# Patient Record
Sex: Female | Born: 1970 | Race: White | Hispanic: No | Marital: Married | Smoking: Never smoker
Health system: Southern US, Community
[De-identification: ages and names within clinical notes are randomized; demographics above are authoritative.]

## PROBLEM LIST (undated history)

## (undated) DIAGNOSIS — I10 Essential (primary) hypertension: Secondary | ICD-10-CM

## (undated) DIAGNOSIS — G4733 Obstructive sleep apnea (adult) (pediatric): Secondary | ICD-10-CM

## (undated) DIAGNOSIS — G8929 Other chronic pain: Secondary | ICD-10-CM

## (undated) DIAGNOSIS — D649 Anemia, unspecified: Secondary | ICD-10-CM

## (undated) DIAGNOSIS — E669 Obesity, unspecified: Secondary | ICD-10-CM

## (undated) DIAGNOSIS — K219 Gastro-esophageal reflux disease without esophagitis: Secondary | ICD-10-CM

## (undated) DIAGNOSIS — G4769 Other sleep related movement disorders: Secondary | ICD-10-CM

## (undated) DIAGNOSIS — J309 Allergic rhinitis, unspecified: Secondary | ICD-10-CM

## (undated) DIAGNOSIS — M545 Low back pain, unspecified: Secondary | ICD-10-CM

## (undated) DIAGNOSIS — R519 Headache, unspecified: Secondary | ICD-10-CM

## (undated) DIAGNOSIS — M1990 Unspecified osteoarthritis, unspecified site: Secondary | ICD-10-CM

## (undated) DIAGNOSIS — Z9989 Dependence on other enabling machines and devices: Secondary | ICD-10-CM

## (undated) DIAGNOSIS — R51 Headache: Secondary | ICD-10-CM

## (undated) HISTORY — DX: Allergic rhinitis, unspecified: J30.9

## (undated) HISTORY — DX: Obesity, unspecified: E66.9

## (undated) HISTORY — DX: Essential (primary) hypertension: I10

## (undated) HISTORY — DX: Dependence on other enabling machines and devices: Z99.89

## (undated) HISTORY — DX: Other chronic pain: G89.29

## (undated) HISTORY — DX: Other sleep related movement disorders: G47.69

## (undated) HISTORY — DX: Obstructive sleep apnea (adult) (pediatric): G47.33

## (undated) HISTORY — DX: Low back pain, unspecified: M54.50

## (undated) HISTORY — DX: Low back pain: M54.5

---

## 2004-04-15 ENCOUNTER — Emergency Department (HOSPITAL_COMMUNITY): Admission: EM | Admit: 2004-04-15 | Discharge: 2004-04-15 | Payer: Self-pay | Admitting: Emergency Medicine

## 2006-12-19 ENCOUNTER — Emergency Department (HOSPITAL_COMMUNITY): Admission: EM | Admit: 2006-12-19 | Discharge: 2006-12-19 | Payer: Self-pay | Admitting: Emergency Medicine

## 2007-12-06 ENCOUNTER — Emergency Department (HOSPITAL_COMMUNITY): Admission: EM | Admit: 2007-12-06 | Discharge: 2007-12-06 | Payer: Self-pay | Admitting: Emergency Medicine

## 2007-12-06 DIAGNOSIS — R1011 Right upper quadrant pain: Secondary | ICD-10-CM

## 2008-10-09 ENCOUNTER — Ambulatory Visit: Payer: Self-pay | Admitting: Internal Medicine

## 2008-10-09 LAB — CONVERTED CEMR LAB
ALT: 15 units/L (ref 0–35)
Albumin: 3.6 g/dL (ref 3.5–5.2)
Alkaline Phosphatase: 79 units/L (ref 39–117)
BUN: 10 mg/dL (ref 6–23)
Basophils Relative: 0.9 % (ref 0.0–3.0)
Bilirubin Urine: NEGATIVE
Calcium: 8.8 mg/dL (ref 8.4–10.5)
Creatinine, Ser: 0.6 mg/dL (ref 0.4–1.2)
Eosinophils Absolute: 0.2 10*3/uL (ref 0.0–0.7)
HCT: 32.8 % — ABNORMAL LOW (ref 36.0–46.0)
Hemoglobin: 11 g/dL — ABNORMAL LOW (ref 12.0–15.0)
Ketones, ur: NEGATIVE mg/dL
Lymphocytes Relative: 19.7 % (ref 12.0–46.0)
MCV: 81.1 fL (ref 78.0–100.0)
Neutrophils Relative %: 69.4 % (ref 43.0–77.0)
Platelets: 311 10*3/uL (ref 150.0–400.0)
RBC: 4.05 M/uL (ref 3.87–5.11)
Sodium: 143 meq/L (ref 135–145)
TSH: 1.98 microintl units/mL (ref 0.35–5.50)
Total CHOL/HDL Ratio: 5
Total Protein, Urine: NEGATIVE mg/dL
Total Protein: 7.7 g/dL (ref 6.0–8.3)
Urine Glucose: NEGATIVE mg/dL
Urobilinogen, UA: 0.2 (ref 0.0–1.0)
VLDL: 14 mg/dL (ref 0.0–40.0)

## 2008-10-13 DIAGNOSIS — J329 Chronic sinusitis, unspecified: Secondary | ICD-10-CM | POA: Insufficient documentation

## 2008-10-13 DIAGNOSIS — J309 Allergic rhinitis, unspecified: Secondary | ICD-10-CM | POA: Insufficient documentation

## 2008-10-13 DIAGNOSIS — K8020 Calculus of gallbladder without cholecystitis without obstruction: Secondary | ICD-10-CM

## 2008-10-14 ENCOUNTER — Ambulatory Visit: Payer: Self-pay | Admitting: Internal Medicine

## 2008-10-14 DIAGNOSIS — I10 Essential (primary) hypertension: Secondary | ICD-10-CM

## 2008-10-14 DIAGNOSIS — R259 Unspecified abnormal involuntary movements: Secondary | ICD-10-CM | POA: Insufficient documentation

## 2008-10-16 ENCOUNTER — Encounter: Payer: Self-pay | Admitting: Internal Medicine

## 2008-10-16 LAB — CONVERTED CEMR LAB
Folate: 17.4 ng/mL
Sed Rate: 57 mm/hr — ABNORMAL HIGH (ref 0–22)
Vitamin B-12: 287 pg/mL (ref 211–911)

## 2008-11-11 ENCOUNTER — Ambulatory Visit: Payer: Self-pay | Admitting: Internal Medicine

## 2009-02-23 HISTORY — PX: CHOLECYSTECTOMY: SHX55

## 2009-02-24 ENCOUNTER — Ambulatory Visit: Payer: Self-pay | Admitting: Internal Medicine

## 2009-03-01 ENCOUNTER — Encounter: Payer: Self-pay | Admitting: Internal Medicine

## 2009-03-01 ENCOUNTER — Inpatient Hospital Stay (HOSPITAL_COMMUNITY): Admission: AD | Admit: 2009-03-01 | Discharge: 2009-03-03 | Payer: Self-pay | Admitting: Surgery

## 2009-03-02 ENCOUNTER — Encounter (INDEPENDENT_AMBULATORY_CARE_PROVIDER_SITE_OTHER): Payer: Self-pay | Admitting: Surgery

## 2009-03-18 ENCOUNTER — Encounter: Payer: Self-pay | Admitting: Internal Medicine

## 2009-06-10 ENCOUNTER — Ambulatory Visit: Payer: Self-pay | Admitting: Internal Medicine

## 2009-08-19 ENCOUNTER — Ambulatory Visit: Payer: Self-pay | Admitting: Internal Medicine

## 2009-08-19 ENCOUNTER — Encounter: Payer: Self-pay | Admitting: Internal Medicine

## 2009-08-19 DIAGNOSIS — D649 Anemia, unspecified: Secondary | ICD-10-CM

## 2009-08-19 LAB — CONVERTED CEMR LAB
Basophils Relative: 0.4 % (ref 0.0–3.0)
Compl, Total (CH50): 60 (ref 31–60)
Complement C4, Body Fluid: 27 mg/dL (ref 16–47)
Eosinophils Relative: 1.7 % (ref 0.0–5.0)
HCT: 35.6 % — ABNORMAL LOW (ref 36.0–46.0)
Hemoglobin: 12 g/dL (ref 12.0–15.0)
IgA: 183 mg/dL (ref 68–378)
Lymphocytes Relative: 19.7 % (ref 12.0–46.0)
MCV: 82 fL (ref 78.0–100.0)
Neutro Abs: 7.8 10*3/uL — ABNORMAL HIGH (ref 1.4–7.7)
Neutrophils Relative %: 72.2 % (ref 43.0–77.0)
Platelets: 394 10*3/uL (ref 150.0–400.0)
Rhuematoid fact SerPl-aCnc: <20 intl units/mL (ref 0–20)
Saturation Ratios: 14 % — ABNORMAL LOW (ref 20.0–50.0)
TSH: 1.52 microintl units/mL (ref 0.35–5.50)
Transferrin: 234.6 mg/dL (ref 212.0–360.0)

## 2009-11-08 ENCOUNTER — Telehealth: Payer: Self-pay | Admitting: Internal Medicine

## 2009-11-08 ENCOUNTER — Ambulatory Visit: Payer: Self-pay | Admitting: Internal Medicine

## 2009-11-08 DIAGNOSIS — J019 Acute sinusitis, unspecified: Secondary | ICD-10-CM

## 2009-11-24 ENCOUNTER — Ambulatory Visit: Payer: Self-pay | Admitting: Internal Medicine

## 2009-11-24 DIAGNOSIS — M26629 Arthralgia of temporomandibular joint, unspecified side: Secondary | ICD-10-CM

## 2010-02-21 ENCOUNTER — Encounter: Payer: Self-pay | Admitting: Internal Medicine

## 2010-02-21 ENCOUNTER — Other Ambulatory Visit: Payer: Self-pay | Admitting: Internal Medicine

## 2010-02-21 ENCOUNTER — Ambulatory Visit
Admission: RE | Admit: 2010-02-21 | Discharge: 2010-02-21 | Payer: Self-pay | Source: Home / Self Care | Attending: Internal Medicine | Admitting: Internal Medicine

## 2010-02-21 LAB — BASIC METABOLIC PANEL
Chloride: 98 mEq/L (ref 96–112)
Creatinine, Ser: 0.6 mg/dL (ref 0.4–1.2)
Potassium: 4.1 mEq/L (ref 3.5–5.1)

## 2010-02-22 NOTE — Assessment & Plan Note (Signed)
Summary: f/u meds/GALL BLADDER PROBLEMS/RS'D /NWS#/cd   Vital Signs:  Patient profile:   40 year old female Height:      65 inches (165.10 cm) Weight:      290.8 pounds (132.18 kg) O2 Sat:      97 % on Room air Temp:     98.5 degrees F (36.94 degrees C) oral Pulse rate:   90 / minute BP sitting:   122 / 72  (left arm) Cuff size:   large  Vitals Entered By: Orlan Leavens (February 24, 2009 1:28 PM)  O2 Flow:  Room air CC: follow-up on meds Is Patient Diabetic? No Pain Assessment Patient in pain? no        Primary Care Provider:  Newt Lukes MD  CC:  follow-up on meds.  History of Present Illness: here for followup on cholelithiasis hx - c/o increase GB attacks  in past few mos - describes as sharp RUQ pain, worse with any food other than "water" symptoms worse with meals, better with fasting sudden onset sharp pain, gradually eases off over hours-days currently mild constant pain  +hx same - seen by surg 2 mos ago and rec surg - but pt wanted to wait until after insurance changes 2/19 ?if any other tx besides surg for problem and ?if ok to wait until 2/19 for surg--- no fever, no severe pain - no n/v or dehydration    prior hx also reviewed today: fatigue and muscle twitch - seems somewhat improved with change from heat to cooler weather labs reviewed and inc ESR explained again - has not yet seen rheum "i need to work that into my sched" no joint swelling or fever  also has not yet sched time for PAP with gyn  still with numbness in hands mostly present with prolonged activity with hands at work symmetrically present - extneds into arms never present upon waking - ?if CTS symptoms explain this  Clinical Review Panels:  Lipid Management   Cholesterol:  161 (10/09/2008)   LDL (bad choesterol):  113 (10/09/2008)   HDL (good cholesterol):  33.90 (10/09/2008)  CBC   WBC:  9.2 (10/09/2008)   RBC:  4.05 (10/09/2008)   Hgb:  11.0 (10/09/2008)   Hct:   32.8 (10/09/2008)   Platelets:  311.0 (10/09/2008)   MCV  81.1 (10/09/2008)   MCHC  33.7 (10/09/2008)   RDW  13.5 (10/09/2008)   PMN:  69.4 (10/09/2008)   Lymphs:  19.7 (10/09/2008)   Monos:  7.3 (10/09/2008)   Eosinophils:  2.7 (10/09/2008)   Basophil:  0.9 (10/09/2008)  Complete Metabolic Panel   Glucose:  112 (10/09/2008)   Sodium:  143 (10/09/2008)   Potassium:  4.3 (10/09/2008)   Chloride:  107 (10/09/2008)   CO2:  30 (10/09/2008)   BUN:  10 (10/09/2008)   Creatinine:  0.6 (10/09/2008)   Albumin:  3.6 (10/09/2008)   Total Protein:  7.7 (10/09/2008)   Calcium:  8.8 (10/09/2008)   Total Bili:  0.5 (10/09/2008)   Alk Phos:  79 (10/09/2008)   SGPT (ALT):  15 (10/09/2008)   SGOT (AST):  16 (10/09/2008)   Current Medications (verified): 1)  Zyrtec Allergy 10 Mg Caps (Cetirizine Hcl) .... Take 1 By Mouth Qd 2)  Naprosyn 250 Mg Tabs (Naproxen) .... Take Prn 3)  Tylenol Extra Strength 500 Mg Tabs (Acetaminophen) .... Take Prn 4)  Multivitamins  Tabs (Multiple Vitamin) .... Take 1 By Mouth Qd 5)  Milk Thistle 200 Mg Caps (Milk  Thistle) .... Take 1 By Mouth Qd  Allergies (verified): No Known Drug Allergies  Past History:  Past Medical History: Last updated: 10/14/2008 Allergic rhinitis Cholelithiasis Hypertension morbid obesity  Review of Systems       The patient complains of abdominal pain.  The patient denies fever, chest pain, syncope, peripheral edema, and severe indigestion/heartburn.    Physical Exam  General:  overweight-appearing.  alert, well-developed, well-nourished, and cooperative to examination.   spouse at side Lungs:  normal respiratory effort, no intercostal retractions or use of accessory muscles; normal breath sounds bilaterally - no crackles and no wheezes.    Heart:  normal rate, regular rhythm, no murmur, and no rub. BLE with 1+ edema.  Abdomen:  obese, soft, non-tender, normal bowel sounds, no distention; no masses and no appreciable  hepatomegaly or splenomegaly.   Skin:  pale but no rashes, vesicles, ulcers, or erythema. No nodules or irregularity to palpation.  no shingles along RUQ   Impression & Recommendations:  Problem # 1:  CHOLELITHIASIS (ICD-574.20)  recurrent pain symptoms - +hx same  [place] done11/13/09 with cholelithiasis and fatty liver benign exam todayencouraged to sched shole with gen surg as prev rec advised continued low fat diet modification as well as weight loss advised on warning signs such as severe pain, fever or inability to tol by mouth that would promt intervention before 2/19 (approx 2 weeks away) - pt agrees and will contact surg office  Orders: Surgical Referral (Surgery)  Complete Medication List: 1)  Zyrtec Allergy 10 Mg Caps (Cetirizine hcl) .... Take 1 by mouth qd 2)  Naprosyn 250 Mg Tabs (Naproxen) .... Take prn 3)  Tylenol Extra Strength 500 Mg Tabs (Acetaminophen) .... Take prn 4)  Multivitamins Tabs (Multiple vitamin) .... Take 1 by mouth qd 5)  Milk Thistle 200 Mg Caps (Milk thistle) .... Take 1 by mouth qd  Patient Instructions: 1)  it was good to see you today.  2)  ok to proceed with surg plans as discussed -  3)  Please schedule a follow-up appointment as scheduled, sooner if problems.

## 2010-02-22 NOTE — Letter (Signed)
Summary: [facility]   Imported By: Lester Elmore 06/14/2009 11:20:44  _____________________________________________________________________  External Attachment:    Type:   Image     Comment:   External Document

## 2010-02-22 NOTE — Letter (Signed)
Summary: Out of Work  [facility]  [address] [place], [place] [address]   Phone: [phone]  Fax: [phone]    November 08, 2009   Employee:  JAMILETT FERRANTE    To Whom It May Concern:   For Medical reasons, please excuse the above named employee from work for the following dates:  Start:   Nov 08, 2009  End:   Nov 09, 2009  -    to return to work without restriction Nov 10, 2009  If you need additional information, please feel free to contact our office.         Sincerely,    Corwin Levins MD

## 2010-02-22 NOTE — Assessment & Plan Note (Signed)
Summary: FLU? /NWS   Vital Signs:  Patient profile:   40 year old female Height:      64 inches Weight:      296.13 pounds BMI:     51.01 O2 Sat:      97 % on Room air Temp:     99.1 degrees F oral Pulse rate:   113 / minute BP sitting:   128 / 80  (left arm) Cuff size:   large  Vitals Entered By: Zella Ball Ewing CMA (AAMA) (November 08, 2009 4:00 PM)  O2 Flow:  Room air CC: Sore throat, fever, coughing, congestion/RE   Primary Care Brexley Cutshaw:  Newt Lukes MD  CC:  Sore throat, fever, coughing, and congestion/RE.  History of Present Illness: here with acute onset mild to mod 2-3 days facial pain, pressure, fever, greenish d/c, mild ST and Pt denies CP, worsening sob, doe, wheezing, orthopnea, pnd, worsening LE edema, palps, dizziness or syncope  This is on top fo 3 wks worsening nasal allergy symptoms with congestion , post nasal gtt.  and today also with body aches and mild nausea  but no vomiting, abd pain, bowel change or blood.  Pt denies new neuro symptoms such as headache, facial or extremity weakness  No  wt loss, night sweats, loss of appetite or other constitutional symptoms  Wants to know how BP doing today  Problems Prior to Update: 1)  Sinusitis- Acute-nos  (ICD-461.9) 2)  Anemia, Mild  (ICD-285.9) 3)  Pain in Joint, Multiple Sites  (ICD-719.49) 4)  Numbness  (ICD-782.0) 5)  Muscle Fasciculations  (ICD-781.0) 6)  Morbid Obesity  (ICD-278.01) 7)  Fatigue  (ICD-780.79) 8)  Hypertension  (ICD-401.9) 9)  Cholelithiasis  (ICD-574.20) 10)  Sinusitis  (ICD-473.9) 11)  Allergic Rhinitis  (ICD-477.9) 12)  Nausea  (ICD-787.02) 13)  Ruq Pain  (ICD-789.01) 14)  Preventive Health Care  (ICD-V70.0)  Medications Prior to Update: 1)  Naprosyn 250 Mg Tabs (Naproxen) .... Take Prn 2)  Tylenol Extra Strength 500 Mg Tabs (Acetaminophen) .... Take Prn 3)  Multivitamins  Tabs (Multiple Vitamin) .... Take 1 By Mouth Qd 4)  Allegra 60 Mg Tabs (Fexofenadine Hcl) .Marland Kitchen.. 1 Once  Daily  Current Medications (verified): 1)  Naprosyn 250 Mg Tabs (Naproxen) .... Take Prn 2)  Tylenol Extra Strength 500 Mg Tabs (Acetaminophen) .... Take Prn 3)  Multivitamins  Tabs (Multiple Vitamin) .... Take 1 By Mouth Qd 4)  Levocetirizine Dihydrochloride 5 Mg Tabs (Levocetirizine Dihydrochloride) .Marland Kitchen.. 1po Once Daily As Needed Allergies 5)  Azithromycin 250 Mg Tabs (Azithromycin) .... 2po Qd For 1 Day, Then 1po Qd For 4days, Then Stop  Allergies (verified): No Known Drug Allergies  Past History:  Past Medical History: Last updated: 08/19/2009 Allergic rhinitis Cholelithiasis Hypertension-not on treatment morbid obesity  Past Surgical History: Last updated: 08/19/2009 Lab Chole Feb '11 ( Dr. Daphine Deutscher)  GoP0  Social History: Last updated: 08/19/2009 [employer]. 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  Risk Factors: Alcohol Use: <1 (10/14/2008) Caffeine Use: 2 (10/14/2008) Exercise: no (10/14/2008)  Risk Factors: Smoking Status: never (10/14/2008)  Review of Systems       all otherwise negative per pt -    Physical Exam  General:  alert and overweight-appearing. , mild ill   Head:  normocephalic and atraumatic.   Eyes:  vision grossly intact, pupils equal, and pupils round.   Ears:  left tm  severe red, right TM mild erythema, sinus tender bilat Nose:  no external deformity, nasal dischargemucosal pallor, and mucosal edema.   Mouth:  pharyngeal erythema and fair dentition.   Neck:  supple and cervical lymphadenopathy.   Lungs:  normal respiratory effort and normal breath sounds.   Heart:  normal rate and regular rhythm.   Extremities:  no edema, no erythema    Impression & Recommendations:  Problem # 1:  SINUSITIS- ACUTE-NOS (ICD-461.9)  Her updated medication list for this problem includes:    Azithromycin 250 Mg Tabs (Azithromycin)  .Marland Kitchen... 2po qd for 1 day, then 1po qd for 4days, then stop treat as above, f/u any worsening signs or symptoms   Problem # 2:  ALLERGIC RHINITIS (ICD-477.9)  Her updated medication list for this problem includes:    Levocetirizine Dihydrochloride 5 Mg Tabs (Levocetirizine dihydrochloride) .Marland Kitchen... 1po once daily as needed allergies treat as above, f/u any worsening signs or symptoms   Problem # 3:  HYPERTENSION (ICD-401.9)  BP today: 128/80 Prior BP: 142/90 (08/19/2009)  Labs Reviewed: K+: 4.3 (10/09/2008) Creat: : 0.6 (10/09/2008)   Chol: 161 (10/09/2008)   HDL: 33.90 (10/09/2008)   LDL: 113 (10/09/2008)   TG: 70.0 (10/09/2008) stable overall by hx and exam, ok to continue meds/tx as is - no meds needed at this time  Complete Medication List: 1)  Naprosyn 250 Mg Tabs (Naproxen) .... Take prn 2)  Tylenol Extra Strength 500 Mg Tabs (Acetaminophen) .... Take prn 3)  Multivitamins Tabs (Multiple vitamin) .... Take 1 by mouth qd 4)  Levocetirizine Dihydrochloride 5 Mg Tabs (Levocetirizine dihydrochloride) .Marland Kitchen.. 1po once daily as needed allergies 5)  Azithromycin 250 Mg Tabs (Azithromycin) .... 2po qd for 1 day, then 1po qd for 4days, then stop  Patient Instructions: 1)  Please take all new medications as prescribed 2)  Continue all previous medications as before this visit  3)  You are given the work note 4)  Please schedule an appointment with your primary doctor as needed Prescriptions: AZITHROMYCIN 250 MG TABS (AZITHROMYCIN) 2po qd for 1 day, then 1po qd for 4days, then stop  #6 x 1   Entered and Authorized by:   Corwin Levins MD   Signed by:   Corwin Levins MD on 11/08/2009   Method used:   Print then Give to Patient   RxID:   [id number] LEVOCETIRIZINE DIHYDROCHLORIDE 5 MG TABS (LEVOCETIRIZINE DIHYDROCHLORIDE) 1po once daily as needed allergies  #30 x 11   Entered and Authorized by:   Corwin Levins MD   Signed by:   Corwin Levins MD on 11/08/2009   Method used:   Print then Give  to Patient   RxID:   [id number]    Orders Added: 1)  Est. Patient Level IV [44010]

## 2010-02-22 NOTE — Letter (Signed)
Summary: [facility]   Imported By: Sherian Rein 03/17/2009 07:38:19  _____________________________________________________________________  External Attachment:    Type:   Image     Comment:   External Document

## 2010-02-22 NOTE — Assessment & Plan Note (Signed)
Summary: TRANSFER FROM DR LESCHBER/NWS  OK PER FLAG TO LESLIE   Vital Signs:  Patient profile:   40 year old female Height:      65 inches Weight:      294 pounds BMI:     49.10 O2 Sat:      98 % on Room air Temp:     97.8 degrees F oral Pulse rate:   100 / minute BP sitting:   142 / 90  (left arm) Cuff size:   large  Vitals Entered By: Ami Bullins CMA (August 19, 2009 1:20 PM)  O2 Flow:  Room air CC: New pt here to est care with primary md/ ab   Primary Care Provider:  Newt Lukes MD  CC:  New pt here to est care with primary md/ ab.  History of Present Illness: 18+month h/o fatigue, pain in the joints, especially right hip. She has shifted weight when standing that has lead to left hip pain. She has twitching of her hands, she has increased weakness over time. She has also had pain in the lower back. Concerned about RA or other rheumatolic disorder. She does take NSAIDs, APAP.   She will have transient episodes of blurred vision, more with left eye, seconds to minutes in duration. She has seen an "eye" doctor with a normal exam. This will usually occur when working or under stress.  She has increasing heat sensitivity. She does have a sister with fibromyalgia. She has a family history of fibromyalgia. Her mother had lymphoma, she also had a long history of pneumonia. Mother  did have testing that was indicative of immuno-deficiency disease.  She has occasional left-chest pain, substernal in location. The pain is a discomfort that is worrisome more than painful. Duration 20-30 minutes. Occurs after physical exertion.  No diaphoresis, no SOB, no radiation of the pain. She does have occasional exeretionally related pain. There is no family history of heart disease in woman. No DM or hyperlipidemia present. Borderline HTN but not on treatment.   She does c/o seasonal allergies - usually spring. She has failed claritin, zyrtec. Currently she gets relief with allegra.  Current  Medications (verified): 1)  Naprosyn 250 Mg Tabs (Naproxen) .... Take Prn 2)  Tylenol Extra Strength 500 Mg Tabs (Acetaminophen) .... Take Prn 3)  Multivitamins  Tabs (Multiple Vitamin) .... Take 1 By Mouth Qd 4)  Allegra 60 Mg Tabs (Fexofenadine Hcl) .Marland Kitchen.. 1 Once Daily  Allergies (verified): No Known Drug Allergies  Past History:  Past Medical History: Allergic rhinitis Cholelithiasis Hypertension-not on treatment morbid obesity  Past Surgical History: Lab Chole Feb '11 ( Dr. Daphine Deutscher)  GoP0  Family History: Father -1939: OA, HTN,  Mother died @ 22 due to lymphoma & MAI infection in lungs.  One sister has melanoma, other has fibromyalgia. 2 aunts also with autoimmune disorders. 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  Physical Exam  General:  overweight white female in no distress Head:  Normocephalic and atraumatic without obvious abnormalities. No apparent alopecia or balding. Eyes:  vision grossly intact, pupils equal, pupils round, corneas and lenses clear, and no injection.   Ears:  External ear exam shows no significant lesions or deformities.  Otoscopic examination  reveals clear canals, tympanic membranes are intact bilaterally without bulging, retraction, inflammation or discharge. Hearing is grossly normal bilaterally. Nose:  no external deformity and no external erythema.   Mouth:  Oral mucosa and oropharynx without lesions or exudates.  Teeth in good repair. Neck:  supple, full ROM, no thyromegaly, no JVD, and no carotid bruits.   Chest Wall:  No deformities, masses, or tenderness noted. Breasts:  deferred Lungs:   Normal respiratory effort, chest expands symmetrically. Lungs are clear to auscultation, no crackles or wheezes. Heart:  normal rate, regular rhythm, no murmur, and no gallop.   Abdomen:  soft, non-tender, and normal bowel sounds.   Genitalia:  deferred Msk:  normal ROM, no joint tenderness, no joint swelling, and no redness over joints.   Pulses:  2+ radial pulse Extremities:  No clubbing, cyanosis, edema, or deformity noted with normal full range of motion of all joints.   Neurologic:  alert & oriented X3, cranial nerves II-XII intact, strength normal in all extremities, gait normal, and DTRs symmetrical and normal.   Skin:  turgor normal, color normal, no rashes, and no suspicious lesions.   Cervical Nodes:  no anterior cervical adenopathy and no posterior cervical adenopathy.   Psych:  Oriented X3, memory intact for recent and remote, normally interactive, and good eye contact.     Impression & Recommendations:  Problem # 1:  ANEMIA, MILD (ICD-285.9) History of anemia.  Plan - follow-up lab: CBC, B12, iron studies.   Orders: TLB-B12 + Folate Pnl ([id number]-O75/IEP) TLB-CBC Platelet - w/Differential (85025-CBCD) TLB-IBC Pnl (Iron/FE;Transferrin) (83550-IBC)  Addendum - Hemoglobin at 12g is normal; B12 normal, Iron is borderline with a low percent saturation  Plan  dietary iron supplementation  Problem # 2:  MORBID OBESITY (ICD-278.01) Discussed obesity as THE major health threat facing her.   Plan - weight management program based on 1) smart food choices with no forbidden foods, 2) portion size control and frequent feedings, 3) a modicum of exercise at least 3 times a week that provides aerobic challenge and calorie burn.           Target weight 180 lbs; Goal - to loose 1-2 lbs/month  Problem # 3:  PAIN IN JOINT, MULTIPLE SITES (ICD-719.49) Patient with multiple joint pain, most especially hips.  Plan - radiographs to establish baseline and to rule out any bone destructive  process           Regular stretch/flex type exercise   Orders: T-Bilateral Hip w/Pelvis, min 2 views (73520TC)  Addendum - DG HIP BILATERAL W/PELVIS - [id number]   Clinical Data: Chronic right hip pain.   BILATERAL HIP WITH PELVIS - 4+ VIEW   Comparison: None   Findings: No acute bony abnormality.  Specifically, no fracture, subluxation, or dislocation.  Soft tissues are intact. Joint spaces are maintained.  SI joints are unremarkable.   IMPRESSION: No bony abnormality.   Read By:  Charlett Nose,  M.D  Problem # 4:  FATIGUE (ICD-780.79) Patient with strong faily history for fibromyalgia and a concern for immunodeficiency problems. She has not had frequent bacterial infections or other signs/smptoms of immune failure.  Plan - lab evaluation to include immunoglobulins, signs of inflammation or of inflammatory tissue disease.  Orders: TLB-B12 + Folate Pnl ([id number]/NAT) TLB-CBC Platelet - w/Differential (85025-CBCD) TLB-TSH (Thyroid Stimulating Hormone) (84443-TSH) TLB-IBC Pnl (Iron/FE;Transferrin) (83550-IBC)  Addendum - all labs are within normal range.  Problem # 5:  HYPERTENSION (ICD-401.9)  BP today: 142/90 Prior BP: 122/72 (02/24/2009)  Mild elevation of BP today but previously well controlled.  Plan - no indication for any medical intervention.  Orders: EKG w/ Interpretation (93000)  Problem # 6:  Preventive Health Care (ICD-V70.0) Patient with a normal exam except for weight. Her labs are in normal limits. X-rays are unremarkable. 12 Lead EKG is normal.  In summary - a nice woman who appears to be medically stable. Labs are unremarkable and do not suggest any immunodeficiency. She will return in a reasonable interval for further evaluation and exam.  Complete Medication List: 1)  Naprosyn 250 Mg Tabs (Naproxen) .... Take prn 2)  Tylenol Extra Strength 500 Mg Tabs (Acetaminophen) .... Take prn 3)  Multivitamins Tabs (Multiple vitamin) .... Take 1 by  mouth qd 4)  Allegra 60 Mg Tabs (Fexofenadine hcl) .Marland Kitchen.. 1 once daily  Other Orders: T- * Misc. Laboratory test [phone])  Patient: ALISEA MATTE Note: All result statuses are Final unless otherwise noted.  Tests: (1) B12 + Folate Panel (B12/FOL)   Vitamin B12               305 pg/mL                   211-911   Folate                    12.4 ng/mL     Deficient  0.4 - 3.4 ng/mL     Indeterminate  3.4 - 5.4 ng/mL     Normal  >5.4 ng/mL  Tests: (2) CBC Platelet w/Diff (CBCD)   White Cell Count     [H]  10.8 K/uL                   4.5-10.5   Red Cell Count            4.35 Mil/uL                 3.87-5.11   Hemoglobin                12.0 g/dL                   54.6-27.0   Hematocrit           [L]  35.6 %                      36.0-46.0   MCV                       82.0 fl                     78.0-100.0   MCHC                      33.8 g/dL                   35.0-09.3   RDW                  [H]  14.7 %                      11.5-14.6   Platelet Count            394.0 K/uL                  150.0-400.0   Neutrophil %  72.2 %                      43.0-77.0   Lymphocyte %              19.7 %                      12.0-46.0   Monocyte %                6.0 %                       3.0-12.0   Eosinophils%              1.7 %                       0.0-5.0   Basophils %               0.4 %                       0.0-3.0   Neutrophill Absolute [H]  7.8 K/uL                    1.4-7.7   Lymphocyte Absolute       2.1 K/uL                    0.7-4.0   Monocyte Absolute         0.7 K/uL                    0.1-1.0  Eosinophils, Absolute                             0.2 K/uL                    0.0-0.7   Basophils Absolute        0.0 K/uL                    0.0-0.1  Tests: (3) TSH (TSH)   FastTSH                   1.52 uIU/mL                 0.35-5.50  Tests: (4) IBC Panel (IBC)   Iron                      46 ug/dL                    22-025   Transferrin               234.6 mg/dL                  427.0-623.7   Iron Saturation      [L]  14.0 %                      20.0-50.0  Tests: (1) Immunoglobulins, Quantitative (62831)   IgG                       1370 mg/dL                  517-6160   IgA  183 mg/dL                   27-253   IgM                       167 mg/dL                   66-440  Tests: (2) Complement C3 (23830)   Complement C3             199 mg/dL                   34-742  Tests: (3) Complement C4 (59563)   Complement C4             27 mg/dL                    87-56  Tests: (4) Rheumatoid (RA) Factor (43329)  Rheumatoid (RA) Factor                             < 20 IU/mL                  0-20  Tests: (5) Complement, Total, CH50 (82190)  Complement, Total, CH50                             60 U/mL                     31-60

## 2010-02-22 NOTE — Progress Notes (Signed)
Summary: [place]  Phone Note Call from Patient   Caller: Spouse Summary of Call: Richard left vm stating pt has flu and is requesting Tamiflu for both pt and himself(Richard).  Please advise Initial call taken by: Lanier Prude, [facility]),  November 08, 2009 12:36 PM  Follow-up for Phone Call        Willing to help but will need additional information: fever? N/V? Muscle or joint pain? Follow-up by: Jacques Navy MD,  November 08, 2009 12:54 PM  Additional Follow-up for Phone Call Additional follow up Details #1::        left mess for pt to call back  Additional Follow-up by: Lanier Prude, [facility]),  November 08, 2009 4:45 PM    Additional Follow-up for Phone Call Additional follow up Details #2::    Pt had OV with JWJ 11/08/2009 Follow-up by: Margaret Pyle, CMA,  November 09, 2009 10:49 AM

## 2010-02-22 NOTE — Assessment & Plan Note (Signed)
Summary: office visit for evaluation of continuing sore throat,with fe...   Vital Signs:  Patient profile:   40 year old female Height:      64 inches Weight:      288 pounds BMI:     49.61 O2 Sat:      98 % on Room air Temp:     98.3 degrees F oral Pulse rate:   89 / minute BP sitting:   116 / 72  (left arm) Cuff size:   large  Vitals Entered By: Ami Bullins CMA (November 24, 2009 9:13 AM)  O2 Flow:  Room air CC: pt had previous symptoms of sore throat, coughing and nasal drainage she was seen by Dr Jonny Ruiz and given antibiotic whiched helped with her symptoms but she still has cough and c/o left ear soreness/ ab   Primary Care Dorrene Bently:  Newt Lukes MD  CC:  pt had previous symptoms of sore throat and coughing and nasal drainage she was seen by Dr Jonny Ruiz and given antibiotic whiched helped with her symptoms but she still has cough and c/o left ear soreness/ ab.  History of Present Illness: Emily Gay presents today with a two and a half week history of coughing and head congestion.  She was seen by Dr. Jonny Ruiz on October 17 and presented with cough, sore throat, green nasal discharge, and fevers.  At that time she was given Azithromycin for 5 days.  She finished her course of antibiotics and began to feel better and was almost at 85% of normal.  However, about 2 days later she started feeling tired again and her cough was getting worse.  She was still feeling sinus congestion around the area of her frontal sinus and often had sinus headaches.  She also developed an 8/10 pain and pressure in her left ear that she sometimes feels radiates down her left jaw and up to her temporal region.  She denied any fevers, chills, or sore throat.  She had white sputum that she produced with coughing.  She is very tired, mostly because her cough is keeping her awake at night.  The cough she feels has a tickling sensation to it that seems to irritate her throat and cause her to cough some of the time.   She takes pseudophedrine, Mucinex, and ibuprofen at night time and it helps her symptoms but it makes her too sleepy to take duriong the day.  She has tried Robatussin but it has not helped.   Of note the patient also reports that she has been told by her dentist that she grinds her teeth at night.  Current Medications (verified): 1)  Naprosyn 250 Mg Tabs (Naproxen) .... Take Prn 2)  Tylenol Extra Strength 500 Mg Tabs (Acetaminophen) .... Take Prn 3)  Multivitamins  Tabs (Multiple Vitamin) .... Take 1 By Mouth Qd 4)  Levocetirizine Dihydrochloride 5 Mg Tabs (Levocetirizine Dihydrochloride) .Marland Kitchen.. 1po Once Daily As Needed Allergies  Allergies (verified): No Known Drug Allergies  Past History:  Past Medical History: Last updated: 08/19/2009 Allergic rhinitis Cholelithiasis Hypertension-not on treatment morbid obesity  Past Surgical History: Last updated: 08/19/2009 Lab Chole Feb '11 ( Dr. Daphine Deutscher)  GoP0  Family History: Last updated: 08/19/2009 Father -1939: OA, HTN,  Mother died @ 34 due to lymphoma & MAI infection in lungs.  One sister has melanoma, other has fibromyalgia. 2 aunts also with autoimmune disorders. Family History of Arthritis (parent & grandparent) Family History Hypertension (other blood relative) Heart disease (grandparent)  Stroke (parent & grandparent) emotional illness (other blood relative)  Social History: Last updated: 08/19/2009 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  Risk Factors: Alcohol Use: <1 (10/14/2008) Caffeine Use: 2 (10/14/2008) Exercise: no (10/14/2008)  Risk Factors: Smoking Status: never (10/14/2008)  Review of Systems       The patient complains of prolonged cough and headaches.  The patient denies anorexia, fever, weight loss, weight gain, vision loss, decreased hearing, hoarseness, chest pain,  syncope, dyspnea on exertion, peripheral edema, hemoptysis, abdominal pain, melena, hematochezia, hematuria, incontinence, muscle weakness, difficulty walking, unusual weight change, abnormal bleeding, and enlarged lymph nodes.    Physical Exam  General:  alert, well-developed, and well-nourished.   Head:  normocephalic and atraumatic.   Eyes:  pupils equal, pupils round, pupils reactive to light, and corneas and lenses clear.  No tenderness to percussion of the maxillary sinus and normal transillumination. Slight tenderness to percussion and fairly normal transillumination of the frontal sinus bilaterally. Ears:  Both move with insuflation. R ear normal, L ear normal, and no external deformities.   Nose:  no external deformity, no external erythema, no nasal discharge, no mucosal edema, no airflow obstruction, and mucosal erythema.   Mouth:  good dentition, no exudates, and pharyngeal erythema.   Click felt at left TMJ when opening/closing the jaw.  Tenderness to palpation of the left TM Joint. Neck:  supple, no masses, no cervical lymphadenopathy, and no neck tenderness.   Lungs:  normal respiratory effort, normal breath sounds, no dullness, no crackles, and no wheezes.   Heart:  normal rate, regular rhythm, no murmur, no gallop, and no rub.   Pulses:  R radial normal, R posterior tibial normal, L radial normal, and L posterior tibial normal.   Extremities:  trace left pedal edema and trace right pedal edema.   Neurologic:  alert & oriented X3 and cranial nerves II-XII intact.   Skin:  turgor normal, color normal, and no rashes.   Cervical Nodes:  no anterior cervical adenopathy and no posterior cervical adenopathy.   Psych:  Oriented X3, memory intact for recent and remote, normally interactive, and good eye contact.     Impression & Recommendations:  Problem # 1:  COUGH (ICD-786.2) Since the bacterial infection seems to have resolved itself (afebrile, no green sputum) the cough seems to  be more of a cyclical cough secondary to trachial irritation of the previous illness.  Decreasing the trachial inflammation should decrease her cough.  Plan:  Codeine cough syrup with phenergan at night time for cough.           Robatussin DM during the day for cough           Tessalon Pearls three times a day           Prednison x 10 days  Problem # 2:  TMJ PAIN (ICD-524.62) She reports a sharp severe 8/10 pain that she feels in her ear along wiht the left sided facial pain.  In addition to that she felt a click on physical exam when opening and closing her jaw and pain when palpating the TM Joint.  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  Plan:  Bite block from dentist           No gum  Small bites of food           Aspercream on skin over joint           Ibuprofen to decrease inflammation   Complete Medication List: 1)  Naprosyn 250 Mg Tabs (Naproxen) .... Take prn 2)  Tylenol Extra Strength 500 Mg Tabs (Acetaminophen) .... Take prn 3)  Multivitamins Tabs (Multiple vitamin) .... Take 1 by mouth qd 4)  Levocetirizine Dihydrochloride 5 Mg Tabs (Levocetirizine dihydrochloride) .Marland Kitchen.. 1po once daily as needed allergies 5)  Prednisone 10 Mg Tabs (Prednisone) .... 2 tabs once daily x 3, 1 tab once daily x 6 6)  Benzonatate 100 Mg Caps (Benzonatate) .Marland Kitchen.. 1 by mouth three times a day for cough 7)  Promethazine Vc/codeine 6.25-5-10 Mg/108ml Syrp (Phenyleph-promethazine-cod) .Marland Kitchen.. 1 tsp q 6 as needed cough-beware of sedation Prescriptions: PROMETHAZINE VC/CODEINE 6.25-5-10 MG/5ML SYRP (PHENYLEPH-PROMETHAZINE-COD) 1 tsp q 6 as needed cough-beware of sedation  #80z x 1   Entered and Authorized by:   Jacques Navy MD   Signed by:   Jacques Navy MD on 11/24/2009   Method used:   Telephoned to ...       [facility] [address] [phone]* (retail)       [address] [place], [place]  [address]       Ph: [phone]       Fax:  [phone]   RxID:   [id number] BENZONATATE 100 MG CAPS (BENZONATATE) 1 by mouth three times a day for cough  #30 x 1   Entered and Authorized by:   Jacques Navy MD   Signed by:   Jacques Navy MD on 11/24/2009   Method used:   Electronically to        [facility] [address] [phone]* (retail)       [address] [place], [place]  [address]       Ph: [phone]       Fax: [phone]   RxID:   [id number] PREDNISONE 10 MG TABS (PREDNISONE) 2 tabs once daily x 3, 1 tab once daily x 6  #12 x 0   Entered and Authorized by:   Jacques Navy MD   Signed by:   Jacques Navy MD on 11/24/2009   Method used:   Electronically to        [facility] [address] [phone]* (retail)       [address] [place], [place]  [address]       Ph: [phone]       Fax: [phone]   RxID:   [id number]    Orders Added: 1)  Est. Patient Level III [70350]

## 2010-02-23 ENCOUNTER — Other Ambulatory Visit: Payer: Self-pay | Admitting: Internal Medicine

## 2010-02-23 DIAGNOSIS — R2230 Localized swelling, mass and lump, unspecified upper limb: Secondary | ICD-10-CM

## 2010-03-01 ENCOUNTER — Encounter (HOSPITAL_COMMUNITY): Payer: Self-pay

## 2010-03-01 ENCOUNTER — Ambulatory Visit (HOSPITAL_COMMUNITY)
Admission: RE | Admit: 2010-03-01 | Discharge: 2010-03-01 | Disposition: A | Discharge status: Home / Self Care | Payer: BC Managed Care – PPO | Source: Ambulatory Visit | Attending: Internal Medicine | Admitting: Internal Medicine

## 2010-03-01 DIAGNOSIS — R229 Localized swelling, mass and lump, unspecified: Secondary | ICD-10-CM | POA: Insufficient documentation

## 2010-03-01 DIAGNOSIS — R2230 Localized swelling, mass and lump, unspecified upper limb: Secondary | ICD-10-CM

## 2010-03-01 MED ORDER — GADOBENATE DIMEGLUMINE 529 MG/ML IV SOLN: 20.0000 mL | Freq: Once | INTRAVENOUS | Status: AC | PRN

## 2010-03-02 ENCOUNTER — Telehealth: Payer: Self-pay | Admitting: Internal Medicine

## 2010-03-02 NOTE — Assessment & Plan Note (Signed)
Summary: pain under right arm/#/cd   Vital Signs:  Patient profile:   40 year old female Height:      64 inches Weight:      285 pounds BMI:     49.10 O2 Sat:      98 % on Room air Temp:     99.2 degrees F oral Pulse rate:   110 / minute BP sitting:   114 / 70  (left arm) Cuff size:   large  Vitals Entered By: Bill Salinas CMA (February 21, 2010 4:06 PM)  O2 Flow:  Room air CC: pt here with c/o pain under her right arm x 2 weeks/ ab   Primary Care Provider:  Newt Lukes MD  CC:  pt here with c/o pain under her right arm x 2 weeks/ ab.  History of Present Illness: Two week h/o pain i the right axilla that has gotten progressively worse, especially over the past week. No recollection of partiuclar injury or strain but she does do a lot repetitive lifting.  No lump or bumps but there is non-specific swelling.  She has had a low grade fever for several days. No hard rigors. No change in the breast: no skin changes or nipple discharge.  Current Medications (verified): 1)  Naprosyn 250 Mg Tabs (Naproxen) .... Take Prn 2)  Tylenol Extra Strength 500 Mg Tabs (Acetaminophen) .... Take Prn 3)  Multivitamins  Tabs (Multiple Vitamin) .... Take 1 By Mouth Qd 4)  Levocetirizine Dihydrochloride 5 Mg Tabs (Levocetirizine Dihydrochloride) .Marland Kitchen.. 1po Once Daily As Needed Allergies  Allergies (verified): No Known Drug Allergies  Past History:  Past Medical History: Last updated: 08/19/2009 Allergic rhinitis Cholelithiasis Hypertension-not on treatment morbid obesity  Past Surgical History: Last updated: 08/19/2009 Lab Chole Feb '11 ( Dr. Daphine Deutscher)  GoP0  Family History: Last updated: 08/19/2009 Father -1937: OA, HTN,  Mother died @ 34 due to lymphoma & MAI infection in lungs.  One sister has melanoma, other has fibromyalgia. 2 aunts also with autoimmune disorders. Family History of Arthritis (parent & grandparent) Family History Hypertension (other blood relative) Heart  disease (grandparent) Stroke (parent & grandparent) emotional illness (other blood relative)  Social History: Last updated: 08/19/2009 [employer]. 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  Review of Systems  The patient denies anorexia, fever, weight loss, weight gain, chest pain, dyspnea on exertion, and abdominal pain.         No night sweats  Physical Exam  General:  obese white female in no distress Head:  normocephalic.   Breasts:  right breast - nl skin, no nipple discharge, no fixed mass or lesion except as noted MSK Lungs:  normal respiratory effort.   Heart:  normal rate and regular rhythm.   Skin:  Patient has a 6cm mass in the anterior aspect of the right axilla in the region of the tail of the breast/pectoralis major. This mass is mobile and tender.   Impression & Recommendations:  Problem # 1:  MASS, RIGHT AXILLA (ICD-782.2)  Patient with a mass/lump in the tail of the breast/anterior right axilla. Exam with tenderness favors non-malignant etiology.   Plan - MRI w &w/o of the right chest/axilla           warm comprsses and APAP           reduced work note provided  Orders: Radiology Referral (Radiology)  Complete Medication  List: 1)  Naprosyn 250 Mg Tabs (Naproxen) .... Take prn 2)  Tylenol Extra Strength 500 Mg Tabs (Acetaminophen) .... Take prn 3)  Multivitamins Tabs (Multiple vitamin) .... Take 1 by mouth qd 4)  Levocetirizine Dihydrochloride 5 Mg Tabs (Levocetirizine dihydrochloride) .Marland Kitchen.. 1po once daily as needed allergies  Other Orders: TLB-BMP (Basic Metabolic Panel-BMET) (80048-METABOL)   Orders Added: 1)  TLB-BMP (Basic Metabolic Panel-BMET) [80048-METABOL] 2)  Radiology Referral [Radiology] 3)  Est. Patient Level III [[address]]

## 2010-03-02 NOTE — Progress Notes (Signed)
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

## 2010-03-03 ENCOUNTER — Other Ambulatory Visit (HOSPITAL_COMMUNITY)
Admission: RE | Admit: 2010-03-03 | Discharge: 2010-03-03 | Disposition: A | Discharge status: Home / Self Care | Payer: BC Managed Care – PPO | Source: Ambulatory Visit | Attending: Internal Medicine | Admitting: Internal Medicine

## 2010-03-03 ENCOUNTER — Ambulatory Visit: Payer: BC Managed Care – PPO | Admitting: Internal Medicine

## 2010-03-03 ENCOUNTER — Encounter: Payer: Self-pay | Admitting: Internal Medicine

## 2010-03-03 DIAGNOSIS — R229 Localized swelling, mass and lump, unspecified: Secondary | ICD-10-CM

## 2010-03-04 ENCOUNTER — Encounter: Payer: Self-pay | Admitting: Internal Medicine

## 2010-03-04 ENCOUNTER — Other Ambulatory Visit: Payer: Self-pay | Admitting: Internal Medicine

## 2010-03-10 ENCOUNTER — Ambulatory Visit (INDEPENDENT_AMBULATORY_CARE_PROVIDER_SITE_OTHER): Payer: BC Managed Care – PPO | Admitting: Internal Medicine

## 2010-03-10 ENCOUNTER — Encounter: Payer: Self-pay | Admitting: Internal Medicine

## 2010-03-10 DIAGNOSIS — R229 Localized swelling, mass and lump, unspecified: Secondary | ICD-10-CM

## 2010-03-10 NOTE — Progress Notes (Signed)
Summary: MRI  Phone Note Call from Patient Call back at Home Phone [phone]   Summary of Call: Patient is requesting results of MRI.  Initial call taken by: Lamar Sprinkles, CMA,  March 02, 2010 10:01 AM  Follow-up for Phone Call        called patient and reviewed findings.  Plan - u/s guided aspiration by Dr. Roena Malady 2/9 at 9:30 pt aware.  Follow-up by: Jacques Navy MD,  March 02, 2010 4:40 PM

## 2010-03-16 NOTE — Miscellaneous (Signed)
Summary: Procedure consent/[facility] [facility]  Procedure consent/[facility]   Imported By: Lester Gonzales 03/07/2010 09:20:16  _____________________________________________________________________  External Attachment:    Type:   Image     Comment:   External Document

## 2010-03-16 NOTE — Assessment & Plan Note (Signed)
Summary: US GUIDED NEEDLE ASPIRATION   Vital Signs:  Patient profile:   40 year old female Height:      64 inches Weight:      285 pounds BMI:     49.10 Temp:     98.2 degrees F oral Pulse rate:   72 / minute Pulse rhythm:   regular Resp:     16 per minute BP sitting:   120 / 82  (left arm) Cuff size:   large  Vitals Entered By: Lanier Prude, CMA(AAMA) (March 03, 2010 9:44 AM)  CC: biopsy of lymph node Is Patient Diabetic? No   Primary Care Provider:  Newt Lukes MD  CC:  biopsy of lymph node.  History of Present Illness: Procedure:  She is here for an US guided FNA requested by Dr Debby Bud  Indication: tender R axilla mass x 3 wks; abnormal MRI  Risks including but not limited by incomplete procedure, bleeding, infection, recurrence were discussed with the patient. Consent form was signed.   Sonography Report: Indication:as above Clinical bedside ultrasound was performed using Terason 3000 machine with a linear transducer.  The images were stored in Terason. We visualized a hypoechotic irregular  lesion along pectoralis muscle and a round LN caudally from it. The mass was tender with pressure. Impression: mass. Procedure: FNA Indication: as above The area of needle introduction was prepped with betadine and alcohol.     10 cc of 1% Lido was used to infiltrate skin and subcutaneous tissue. 10 cc syringe and 2" 21 g needle was used to aspirate. Under US guidance the lesion was entered and the aspirate was obtained in a usual fasion. 3cc of pus was aspirated as well. Pressure was applied. Bandaid. Smears on slides were prepared.  Cultures sent.  Tolerated well. Complicatons - none.     Current Medications (verified): 1)  Naprosyn 250 Mg Tabs (Naproxen) .... Take Prn 2)  Tylenol Extra Strength 500 Mg Tabs (Acetaminophen) .... Take Prn 3)  Multivitamins  Tabs (Multiple Vitamin) .... Take 1 By Mouth Qd 4)  Levocetirizine Dihydrochloride 5 Mg Tabs  (Levocetirizine Dihydrochloride) .Marland Kitchen.. 1po Once Daily As Needed Allergies  Allergies (verified): No Known Drug Allergies  Review of Systems  The patient denies fever.    Physical Exam  General:  obese white female in no distress Msk:    Skin:  Patient has a 4 cm mass in the anterior aspect of the right axilla in the region of the tail of the breast/pectoralis major. This mass is mobile and tender.   Impression & Recommendations:  Problem # 1:  MASS, RIGHT AXILLA (ICD-782.2) Assessment Unchanged See procedure note.  I gave her Augmentin for broad coverage  and Zpack (?cat scratch disease) for now. Ibuprofe/Vicodin as needed. To work 2/13 if ok. Orders: T-Culture & Smear Routine Fluid (Body Fluid) (87070/87205-70260) EMR Misc Charge Code [facility])  Complete Medication List: 1)  Naprosyn 250 Mg Tabs (Naproxen) .... Take prn 2)  Tylenol Extra Strength 500 Mg Tabs (Acetaminophen) .... Take prn 3)  Multivitamins Tabs (Multiple vitamin) .... Take 1 by mouth qd 4)  Levocetirizine Dihydrochloride 5 Mg Tabs (Levocetirizine dihydrochloride) .Marland Kitchen.. 1po once daily as needed allergies 5)  Augmentin 875-125 Mg Tabs (Amoxicillin-pot clavulanate) .Marland Kitchen.. 1 by mouth bid 6)  Ibuprofen 600 Mg Tabs (Ibuprofen) .Marland Kitchen.. 1 by mouth bid  pc x 1 wk then as needed for  pain 7)  Hydrocodone-acetaminophen 5-325 Mg Tabs (Hydrocodone-acetaminophen) .Marland Kitchen.. 1-2 by mouth two times a day as  needed pain  Patient Instructions: 1)  Dr Debby Bud in 1 wk 2)  Call if you are not better in a reasonable amount of time or if worse. Go to ER if feeling really bad!  Prescriptions: HYDROCODONE-ACETAMINOPHEN 5-325 MG TABS (HYDROCODONE-ACETAMINOPHEN) 1-2 by mouth two times a day as needed pain  #20 x 0   Entered and Authorized by:   Tresa Garter MD   Signed by:   Tresa Garter MD on 03/03/2010   Method used:   Print then Give to Patient   RxID:   [id number] AUGMENTIN 875-125 MG TABS (AMOXICILLIN-POT CLAVULANATE) 1  by mouth bid  #20 x 0   Entered and Authorized by:   Tresa Garter MD   Signed by:   Tresa Garter MD on 03/03/2010   Method used:   Electronically to        [facility]. [phone]* (retail)       [address]       [place], [place]  [address]       Ph: [phone]       Fax: [phone]   RxID:   [id number] ZITHROMAX Z-PAK 250 MG TABS (AZITHROMYCIN) as dirrected  #1 x 0   Entered and Authorized by:   Tresa Garter MD   Signed by:   Tresa Garter MD on 03/03/2010   Method used:   Electronically to        [facility]. [phone]* (retail)       [address]       [place], [place]  [address]       Ph: [phone]       Fax: [phone]   RxID:   [id number] IBUPROFEN 600 MG TABS (IBUPROFEN) 1 by mouth bid  pc x 1 wk then as needed for  pain  #60 x 3   Entered and Authorized by:   Tresa Garter MD   Signed by:   Tresa Garter MD on 03/03/2010   Method used:   Electronically to        [facility]. [phone]* (retail)       [address]       [place], [place]  [address]       Ph: [phone]       Fax: [phone]   RxID:   [id number] IBUPROFEN 600 MG TABS (IBUPROFEN) 1 by mouth bid  pc x 1 wk then as needed for  pain  #60 x 3   Entered and Authorized by:   Tresa Garter MD   Signed by:   Tresa Garter MD on 03/03/2010   Method used:   Electronically to        [facility] [address] [phone]* (retail)       [address] [place], [place]  [address]       Ph: [phone]       Fax: [phone]   RxID:   [id number] ZITHROMAX Z-PAK 250 MG TABS (AZITHROMYCIN) as dirrected  #1 x 0   Entered and Authorized by:   Tresa Garter MD   Signed by:   Tresa Garter MD on 03/03/2010   Method used:   Electronically to        [facility] [address] #  [id number]* (retail)       [address] [place], [place]  [address]       Ph: [phone]       Fax: [phone]   RxID:   [id number] AUGMENTIN 875-125 MG TABS (AMOXICILLIN-POT CLAVULANATE) 1 by mouth bid  #20 x 0   Entered and Authorized by:   Tresa Garter MD   Signed by:   Tresa Garter MD on 03/03/2010   Method used:   Electronically to        [facility] [address] [phone]* (retail)       [address] [place], [place]  [address]       Ph: [phone]       Fax: [phone]   RxID:   [id number]    Orders Added: 1)  T-Culture & Smear Routine Fluid (Body Fluid) [87070/[id number]] 2)  EMR Misc Charge Code [EMRMisc]

## 2010-03-16 NOTE — Assessment & Plan Note (Signed)
Summary: 1 wk f/u on Bx   Vital Signs:  Patient profile:   40 year old female Height:      64 inches Weight:      285.25 pounds BMI:     49.14 O2 Sat:      98 % on Room air Temp:     98.3 degrees F oral Pulse rate:   82 / minute Resp:     18 per minute BP sitting:   130 / 68  (left arm) Cuff size:   large  Vitals Entered By: Burnard Leigh CMA(AAMA) (March 10, 2010 3:31 PM)  O2 Flow:  Room air CC: F/U on Biopsy..sls,cma Is Patient Diabetic? No   Primary Care Provider:  Newt Lukes MD  CC:  F/U on Biopsy..sls and cma.  History of Present Illness: Emily Gay presents for follow-up after U/S guided needle aspiration of inflammatory mass right chest at the region of the pectoralis major. MRI had revealed the mass and some associated lymphadenopathy. Dr. Posey Rea performed the procedure. Path report was c/w inflammatory process. She was started on augmentin for possible cat scratch fever.  She has done well.There is a less prominent mass but there is residual soreness. She reports that after the aspiration she did have URI symptoms that have now resolved.   Current Medications (verified): 1)  Naprosyn 250 Mg Tabs (Naproxen) .... Take Prn 2)  Tylenol Extra Strength 500 Mg Tabs (Acetaminophen) .... Take Prn 3)  Multivitamins  Tabs (Multiple Vitamin) .... Take 1 By Mouth Qd 4)  Levocetirizine Dihydrochloride 5 Mg Tabs (Levocetirizine Dihydrochloride) .Marland Kitchen.. 1po Once Daily As Needed Allergies 5)  Augmentin 875-125 Mg Tabs (Amoxicillin-Pot Clavulanate) .Marland Kitchen.. 1 By Mouth Bid 6)  Ibuprofen 600 Mg Tabs (Ibuprofen) .Marland Kitchen.. 1 By Mouth Bid  Pc X 1 Wk Then As Needed For  Pain 7)  Hydrocodone-Acetaminophen 5-325 Mg Tabs (Hydrocodone-Acetaminophen) .Marland Kitchen.. 1-2 By Mouth Two Times A Day As Needed Pain  Allergies (verified): No Known Drug Allergies  Past History:  Past Medical History: Last updated: 08/19/2009 Allergic rhinitis Cholelithiasis Hypertension-not on treatment morbid  obesity  Past Surgical History: Last updated: 08/19/2009 Lab Chole Feb '11 ( Dr. Daphine Deutscher)  GoP0 FH reviewed for relevance, SH/Risk Factors reviewed for relevance  Review of Systems  The patient denies anorexia, fever, weight loss, weight gain, chest pain, dyspnea on exertion, peripheral edema, abdominal pain, muscle weakness, abnormal bleeding, and enlarged lymph nodes.    Physical Exam  General:  overweight white woman in no distress Head:  normocephalic and atraumatic.   Eyes:  C&S clear Lungs:  normal respiratory effort.   Heart:  normal rate and regular rhythm.   Skin:  no palpable mass in the right axilla-lateral chest wall. Minimal tenderness Cervical Nodes:  no anterior cervical adenopathy and no posterior cervical adenopathy.   Psych:  Oriented X3, normally interactive, and good eye contact.     Impression & Recommendations:  Problem # 1:  MASS, RIGHT AXILLA (ICD-782.2) Most likely an infectious process resolving with antibiotics.  Plan - if there is persistent pain/swelling after completing augmentin she may call for a 5 day extension           provided chpt from [employer] on cat scratch fever.   Complete Medication List: 1)  Naprosyn 250 Mg Tabs (Naproxen) .... Take prn 2)  Tylenol Extra Strength 500 Mg Tabs (Acetaminophen) .... Take prn 3)  Multivitamins Tabs (Multiple vitamin) .... Take 1 by mouth qd 4)  Levocetirizine Dihydrochloride 5 Mg Tabs (  Levocetirizine dihydrochloride) .Marland Kitchen.. 1po once daily as needed allergies 5)  Augmentin 875-125 Mg Tabs (Amoxicillin-pot clavulanate) .Marland Kitchen.. 1 by mouth bid 6)  Ibuprofen 600 Mg Tabs (Ibuprofen) .Marland Kitchen.. 1 by mouth bid  pc x 1 wk then as needed for  pain 7)  Hydrocodone-acetaminophen 5-325 Mg Tabs (Hydrocodone-acetaminophen) .Marland Kitchen.. 1-2 by mouth two times a day as needed pain   Orders Added: 1)  Est. Patient Level III [[address]109]

## 2010-03-29 ENCOUNTER — Other Ambulatory Visit: Payer: BC Managed Care – PPO

## 2010-03-29 ENCOUNTER — Encounter: Payer: Self-pay | Admitting: Internal Medicine

## 2010-03-29 ENCOUNTER — Other Ambulatory Visit: Payer: Self-pay | Admitting: Internal Medicine

## 2010-03-29 ENCOUNTER — Ambulatory Visit (INDEPENDENT_AMBULATORY_CARE_PROVIDER_SITE_OTHER): Payer: BC Managed Care – PPO | Admitting: Internal Medicine

## 2010-03-29 DIAGNOSIS — L049 Acute lymphadenitis, unspecified: Secondary | ICD-10-CM

## 2010-03-29 DIAGNOSIS — R5383 Other fatigue: Secondary | ICD-10-CM

## 2010-03-29 DIAGNOSIS — R259 Unspecified abnormal involuntary movements: Secondary | ICD-10-CM

## 2010-03-29 DIAGNOSIS — R5381 Other malaise: Secondary | ICD-10-CM

## 2010-03-29 DIAGNOSIS — M2550 Pain in unspecified joint: Secondary | ICD-10-CM

## 2010-03-29 LAB — B12 AND FOLATE PANEL
Folate: 12 ng/mL (ref >5.9)
Vitamin B-12: 335 pg/mL (ref 211–911)

## 2010-03-29 LAB — CBC WITH DIFFERENTIAL/PLATELET
Basophils Relative: 0.8 % (ref 0.0–3.0)
Eosinophils Absolute: 0.2 10*3/uL (ref 0.0–0.7)
Hemoglobin: 12.3 g/dL (ref 12.0–15.0)
Lymphs Abs: 2.2 10*3/uL (ref 0.7–4.0)
Monocytes Relative: 5.7 % (ref 3.0–12.0)
RBC: 4.37 Mil/uL (ref 3.87–5.11)
WBC: 10.3 10*3/uL (ref 4.5–10.5)

## 2010-03-29 LAB — SEDIMENTATION RATE: Sed Rate: 53 mm/hr — ABNORMAL HIGH (ref 0–22)

## 2010-03-30 ENCOUNTER — Encounter: Payer: Self-pay | Admitting: Internal Medicine

## 2010-04-05 NOTE — Assessment & Plan Note (Signed)
Summary: BACK PAIN / SWOLLEN LYMPTH NODES /NWS   Vital Signs:  Patient profile:   40 year old female Height:      64 inches Weight:      283 pounds BMI:     48.75 O2 Sat:      98 % on Room air Temp:     98.5 degrees F oral Pulse rate:   83 / minute BP sitting:   122 / 78  (left arm) Cuff size:   regular  Vitals Entered By: Bill Salinas CMA (March 29, 2010 2:08 PM)  O2 Flow:  Room air CC: pt here with c/o back pain. She states lymph node under right arm still sore and wants to discuss muscle spasms/ ab   Primary Care Provider:  Newt Lukes MD  CC:  pt here with c/o back pain. She states lymph node under right arm still sore and wants to discuss muscle spasms/ ab.  History of Present Illness: Patient presents for follow-up after lymphadenitis and needle aspiration. She continues to have some pain in the area of the involved lymph node right upper outer chest was (pectoarlis major).   She also reports that she has intermittent fasiculations that can occur at various locations including arms, face. Never prolonged but becoming more frequent going from a couple of times a week to several times a day. She has been on the internet and is concerned (rightly so) about demyelinating diseases. She is alo reporting that she is having some back disomfort, malise and fatigue. No documented fevers.  Current Medications (verified): 1)  Naprosyn 250 Mg Tabs (Naproxen) .... Take Prn 2)  Tylenol Extra Strength 500 Mg Tabs (Acetaminophen) .... Take Prn 3)  Multivitamins  Tabs (Multiple Vitamin) .... Take 1 By Mouth Qd 4)  Levocetirizine Dihydrochloride 5 Mg Tabs (Levocetirizine Dihydrochloride) .Marland Kitchen.. 1po Once Daily As Needed Allergies 5)  Ibuprofen 600 Mg Tabs (Ibuprofen) .Marland Kitchen.. 1 By Mouth Bid  Pc X 1 Wk Then As Needed For  Pain 6)  Hydrocodone-Acetaminophen 5-325 Mg Tabs (Hydrocodone-Acetaminophen) .Marland Kitchen.. 1-2 By Mouth Two Times A Day As Needed Pain  Allergies (verified): No Known Drug  Allergies  Past History:  Past Medical History: Last updated: 08/19/2009 Allergic rhinitis Cholelithiasis Hypertension-not on treatment morbid obesity  Family History: Last updated: 08/19/2009 Father -1939: OA, HTN,  Mother died @ 55 due to lymphoma & MAI infection in lungs.  One sister has melanoma, other has fibromyalgia. 2 aunts also with autoimmune disorders. Family History of Arthritis (parent & grandparent) Family History Hypertension (other blood relative) Heart disease (grandparent) Stroke (parent & grandparent) emotional illness (other blood relative)  Social History: Last updated: 08/19/2009 [employer] 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  Review of Systems  The patient denies anorexia, fever, weight loss, chest pain, syncope, dyspnea on exertion, prolonged cough, abdominal pain, severe indigestion/heartburn, muscle weakness, difficulty walking, abnormal bleeding, and enlarged lymph nodes.    Physical Exam  General:  overweight white female in no acute distress. Head:  normocephalic and atraumatic.   Eyes:  C&S clear Neck:  supple.   Lungs:  normal respiratory effort.   Heart:  normal rate and regular rhythm.   Msk:  tender the tback in the mid-scapular line in a V pattern, tender to palpation at the anterior chest along the sternum.  Pulses:  2+ radial Neurologic:  alert & oriented X3, cranial nerves II-XII  intact, and DTRs symmetrical and normal.  No observed fasiculations.  Skin:  no suspicious lesions Axillary Nodes:  No palpable mass or node in the anterior axillary line right at site of previous lesion. She is tender Psych:  Oriented X3, normally interactive, and good eye contact.     Impression & Recommendations:  Problem # 1:  ACUTE LYMPHADENITIS (ICD-683) no residual mass after completing antibiotic but some soreness. No  indication for additional anitbiotics  Plan - comfort care.           no work restriction.  Problem # 2:  MUSCLE FASCICULATIONS (ICD-781.0) Patient with increasing fasiculations. she is concerned about possible MS vs ALS. Also concerned for fibromyalgia.  Plan - routine lab work for muscular inflammation           Neuro consult for NCS and eval for demylinating disease.  Orders: TLB-CK Total Only(Creatine Kinase/CPK) (82550-CK) Neurology Referral (Neuro)  addendum - CRP and ESR elevated; CK and B12 normal  Complete Medication List: 1)  Naprosyn 250 Mg Tabs (Naproxen) .... Take prn 2)  Tylenol Extra Strength 500 Mg Tabs (Acetaminophen) .... Take prn 3)  Multivitamins Tabs (Multiple vitamin) .... Take 1 by mouth qd 4)  Levocetirizine Dihydrochloride 5 Mg Tabs (Levocetirizine dihydrochloride) .Marland Kitchen.. 1po once daily as needed allergies 5)  Ibuprofen 600 Mg Tabs (Ibuprofen) .Marland Kitchen.. 1 by mouth bid  pc x 1 wk then as needed for  pain 6)  Hydrocodone-acetaminophen 5-325 Mg Tabs (Hydrocodone-acetaminophen) .Marland Kitchen.. 1-2 by mouth two times a day as needed pain  Other Orders: TLB-CRP-High Sensitivity (C-Reactive Protein) (86140-FCRP) TLB-Sedimentation Rate (ESR) (85652-ESR) TLB-B12 + Folate Pnl (96295_28413-K44/WNU) TLB-TSH (Thyroid Stimulating Hormone) (84443-TSH) TLB-CBC Platelet - w/Differential (85025-CBCD)  Patient: Emily Gay Note: All result statuses are Final unless otherwise noted.  Tests: (1) Full Range CRP (FCRP)   CRPH                 [H]  22.67 mg/L                  0.00-5.00     Note:  An elevated hs-CRP (>5 mg/L) should be repeated after 2 weeks to rule out recent infection or trauma.  Tests: (2) Sed Rate (ESR)   Sed Rate             [H]  53 mm/hr                    0-22  Tests: (3) Creatine Kinase (CK)   Creatine Kinase           67 U/L                      7-177  Tests: (4) B12 + Folate Panel (B12/FOL)   Vitamin B12               335 pg/mL                   211-911   Folate                     12.0 ng/mL                  >5.9  Tests: (5) TSH (TSH)   FastTSH                   2.52 uIU/mL  0.35-5.50  Tests: (6) CBC Platelet w/Diff (CBCD)   White Cell Count          10.3 K/uL                   4.5-10.5   Red Cell Count            4.37 Mil/uL                 3.87-5.11   Hemoglobin                12.3 g/dL                   29.5-28.4   Hematocrit                36.6 %                      36.0-46.0   MCV                       83.7 fl                     78.0-100.0   MCHC                      33.7 g/dL                   13.2-44.0   RDW                  [H]  14.8 %                      11.5-14.6   Platelet Count            340.0 K/uL                  150.0-400.0   Neutrophil %              69.4 %                      43.0-77.0   Lymphocyte %              21.7 %                      12.0-46.0   Monocyte %                5.7 %                       3.0-12.0   Eosinophils%              2.4 %                       0.0-5.0   Basophils %               0.8 %                       0.0-3.0   Neutrophill Absolute      7.2 K/uL                    1.4-7.7   Lymphocyte Absolute       2.2 K/uL  0.7-4.0   Monocyte Absolute         0.6 K/uL                    0.1-1.0  Eosinophils, Absolute                             0.2 K/uL                    0.0-0.7   Basophils Absolute        0.1 K/uL                    0.0-0.1  Orders Added: 1)  TLB-CRP-High Sensitivity (C-Reactive Protein) [86140-FCRP] 2)  TLB-Sedimentation Rate (ESR) [85652-ESR] 3)  TLB-CK Total Only(Creatine Kinase/CPK) [82550-CK] 4)  TLB-B12 + Folate Pnl [82746_82607-B12/FOL] 5)  TLB-TSH (Thyroid Stimulating Hormone) [84443-TSH] 6)  TLB-CBC Platelet - w/Differential [85025-CBCD] 7)  Neurology Referral [Neuro] 8)  Est. Patient Level IV [72536]

## 2010-04-05 NOTE — Letter (Signed)
[facility] [address] [place], [place]  [address] Phone: [phone]      March 31, 2010   [facility] [place] [address] [place], [place] [address]  RE:  LAB RESULTS  Dear  Emily Gay,  The following is an interpretation of your most recent lab tests.  Please take note of any instructions provided or changes to medications that have resulted from your lab work.   THYROID STUDIES:  Thyroid studies normal TSH: 2.52     CBC:  Good - no changes needed B12, Creatinine kinase - normal. C-reactive protein and ESR moderately elevated.   Mild inflammation somewhere based on CRP and ESR. No metabolic explanation for fasiculations. No evidence of infection.  Plan - referral to neurology           over the counter anit-inflammatory of choice, e.g. aleve.  Please come see me if you have any questions about these lab results.   Sincerely Yours,    Jacques Navy MD  Patient: Emily Gay Note: All result statuses are Final unless otherwise noted.  Tests: (1) Full Range CRP (FCRP)   CRPH                 [H]  22.67 mg/L                  0.00-5.00     Note:  An elevated hs-CRP (>5 mg/L) should be repeated after 2 weeks to rule out recent infection or trauma.  Tests: (2) Sed Rate (ESR)   Sed Rate             [H]  53 mm/hr                    0-22  Tests: (3) Creatine Kinase (CK)   Creatine Kinase           67 U/L                      7-177  Tests: (4) B12 + Folate Panel (B12/FOL)   Vitamin B12               335 pg/mL                   211-911   Folate                    12.0 ng/mL                  >5.9  Tests: (5) TSH (TSH)   FastTSH                   2.52 uIU/mL                 0.35-5.50  Tests: (6) CBC Platelet w/Diff (CBCD)   White Cell Count          10.3 K/uL                   4.5-10.5   Red Cell Count            4.37 Mil/uL                 3.87-5.11   Hemoglobin                12.3 g/dL                   30.8-65.0  Hematocrit                36.6 %                       36.0-46.0   MCV                       83.7 fl                     78.0-100.0   MCHC                      33.7 g/dL                   04.5-40.9   RDW                  [H]  14.8 %                      11.5-14.6   Platelet Count            340.0 K/uL                  150.0-400.0   Neutrophil %              69.4 %                      43.0-77.0   Lymphocyte %              21.7 %                      12.0-46.0   Monocyte %                5.7 %                       3.0-12.0   Eosinophils%              2.4 %                       0.0-5.0   Basophils %               0.8 %                       0.0-3.0   Neutrophill Absolute      7.2 K/uL                    1.4-7.7   Lymphocyte Absolute       2.2 K/uL                    0.7-4.0   Monocyte Absolute         0.6 K/uL                    0.1-1.0  Eosinophils, Absolute                             0.2 K/uL                    0.0-0.7   Basophils Absolute        0.1 K/uL  0.0-0.1 

## 2010-04-13 LAB — COMPREHENSIVE METABOLIC PANEL
ALT: 15 U/L (ref 0–35)
AST: 23 U/L (ref 0–37)
Albumin: 3.7 g/dL (ref 3.5–5.2)
Alkaline Phosphatase: 62 U/L (ref 39–117)
Alkaline Phosphatase: 85 U/L (ref 39–117)
CO2: 29 mEq/L (ref 19–32)
Calcium: 8.4 mg/dL (ref 8.4–10.5)
Calcium: 9.1 mg/dL (ref 8.4–10.5)
Chloride: 105 mEq/L (ref 96–112)
GFR calc non Af Amer: >60 mL/min (ref >60)
Glucose, Bld: 138 mg/dL — ABNORMAL HIGH (ref 70–99)
Glucose, Bld: 98 mg/dL (ref 70–99)
Total Bilirubin: 0.7 mg/dL (ref 0.3–1.2)
Total Protein: 6.3 g/dL (ref 6.0–8.3)

## 2010-04-13 LAB — URINALYSIS, ROUTINE W REFLEX MICROSCOPIC
Bilirubin Urine: NEGATIVE
Glucose, UA: NEGATIVE mg/dL
Hgb urine dipstick: NEGATIVE
Ketones, ur: >80 mg/dL — AB
Protein, ur: NEGATIVE mg/dL
Urobilinogen, UA: 1 mg/dL (ref 0.0–1.0)
pH: 7 (ref 5.0–8.0)

## 2010-04-13 LAB — CBC
HCT: 29.7 % — ABNORMAL LOW (ref 36.0–46.0)
HCT: 36.5 % (ref 36.0–46.0)
MCHC: 34.7 g/dL (ref 30.0–36.0)
RDW: 14.5 % (ref 11.5–15.5)

## 2010-04-13 LAB — LIPASE, BLOOD: Lipase: 21 U/L (ref 11–59)

## 2010-04-13 LAB — PREGNANCY, URINE: Preg Test, Ur: NEGATIVE

## 2010-10-25 LAB — URINE MICROSCOPIC-ADD ON

## 2010-10-25 LAB — URINALYSIS, ROUTINE W REFLEX MICROSCOPIC
Bilirubin Urine: NEGATIVE
Nitrite: NEGATIVE
Specific Gravity, Urine: 1.008
Urobilinogen, UA: 0.2

## 2010-10-25 LAB — CBC
HCT: 36.1
Platelets: 371

## 2010-10-25 LAB — DIFFERENTIAL
Basophils Absolute: 0
Lymphocytes Relative: 16
Neutro Abs: 8.4 — ABNORMAL HIGH

## 2010-10-25 LAB — COMPREHENSIVE METABOLIC PANEL
BUN: 8
CO2: 28
Chloride: 104
Creatinine, Ser: 0.58
GFR calc non Af Amer: >60
Total Bilirubin: 0.5

## 2010-10-25 LAB — LIPASE, BLOOD: Lipase: 19

## 2010-11-01 LAB — URINE MICROSCOPIC-ADD ON

## 2010-11-01 LAB — URINALYSIS, ROUTINE W REFLEX MICROSCOPIC
Leukocytes, UA: NEGATIVE
Nitrite: NEGATIVE
Specific Gravity, Urine: 1.026
Urobilinogen, UA: 0.2
pH: 7.5

## 2010-11-01 LAB — COMPREHENSIVE METABOLIC PANEL
AST: 21
BUN: 20
CO2: 23
Calcium: 8.9
Creatinine, Ser: 0.57
GFR calc Af Amer: >60
GFR calc non Af Amer: >60

## 2010-11-01 LAB — CBC
MCHC: 34.1
MCV: 78.3
RBC: 4.49

## 2010-11-01 LAB — DIFFERENTIAL
Eosinophils Relative: 1
Lymphocytes Relative: 3 — ABNORMAL LOW
Lymphs Abs: 0.3 — ABNORMAL LOW
Neutro Abs: 9.6 — ABNORMAL HIGH
Neutrophils Relative %: 93 — ABNORMAL HIGH

## 2010-11-01 LAB — LIPASE, BLOOD: Lipase: 20

## 2011-02-23 ENCOUNTER — Other Ambulatory Visit: Payer: Self-pay | Admitting: Internal Medicine

## 2011-07-01 ENCOUNTER — Encounter: Payer: Self-pay | Admitting: Family Medicine

## 2011-07-01 ENCOUNTER — Ambulatory Visit (INDEPENDENT_AMBULATORY_CARE_PROVIDER_SITE_OTHER): Payer: 59 | Admitting: Family Medicine

## 2011-07-01 VITALS — BP 110/80 | HR 110 | Temp 99.8°F | Ht 64.0 in | Wt 291.0 lb

## 2011-07-01 DIAGNOSIS — N390 Urinary tract infection, site not specified: Secondary | ICD-10-CM

## 2011-07-01 DIAGNOSIS — R300 Dysuria: Secondary | ICD-10-CM

## 2011-07-01 LAB — POCT URINALYSIS DIPSTICK
Bilirubin, UA: NEGATIVE
Blood, UA: NEGATIVE
Glucose, UA: NEGATIVE
Ketones, UA: NEGATIVE
pH, UA: 5

## 2011-07-01 MED ORDER — CIPROFLOXACIN HCL 250 MG PO TABS: 250.0000 mg | ORAL_TABLET | Freq: Two times a day (BID) | ORAL | Status: AC

## 2011-07-01 NOTE — Assessment & Plan Note (Signed)
Uncomplicated but may have been going on as long as 2 weeks Will tx with cipro 250 bid for 7 days Disc symptomatic care - see instructions on AVS  Disc ways to prevent utis  Update if not starting to improve in 2-3 or if worsening

## 2011-07-01 NOTE — Progress Notes (Signed)
  Subjective:    Patient ID: Emily Gay, female    DOB: [birth year], 41 y.o.   MRN: [id number]  HPI Here for suspected uti   Had one back in feb Got better   Today woke up with some low back pain  Some discomfort with urination, some bladder discomfort  Extreme fatigue for several weeks - wondered why  Low grade fever -99.8 today- a little achey- comes and goes  No n/v Worse acid reflux lately  Not drinking enough water   Patient Active Problem List  Diagnoses  . MORBID OBESITY  . ANEMIA, MILD  . HYPERTENSION  . SINUSITIS- ACUTE-NOS  . SINUSITIS  . ALLERGIC RHINITIS  . TMJ PAIN  . CHOLELITHIASIS  . PAIN IN JOINT, MULTIPLE SITES  . FATIGUE  . MUSCLE FASCICULATIONS  . NUMBNESS  . NAUSEA  . RUQ PAIN  . ACUTE LYMPHADENITIS   No past medical history on file. No past surgical history on file. History  Substance Use Topics  . Smoking status: Never Smoker   . Smokeless tobacco: Not on file  . Alcohol Use: Not on file   No family history on file. No Known Allergies Current Outpatient Prescriptions on File Prior to Visit  Medication Sig Dispense Refill  . levocetirizine (XYZAL) 5 MG tablet TAKE ONE TABLET BY MOUTH DAILY AS NEEDED FOR ALLERGIES  30 tablet  2      Review of Systems Review of Systems  Constitutional: Negative for fever, appetite change, fatigue and unexpected weight change.  Eyes: Negative for pain and visual disturbance.  Respiratory: Negative for cough and shortness of breath.   Cardiovascular: Negative for cp or palpitations    Gastrointestinal: Negative for nausea, diarrhea and constipation.  Genitourinary: pos for urgency and frequency. neg for blood in urine Skin: Negative for pallor or rash   Neurological: Negative for weakness, light-headedness, numbness and headaches.  Hematological: Negative for adenopathy. Does not bruise/bleed easily.  Psychiatric/Behavioral: Negative for dysphoric mood. The patient is not nervous/anxious.            Objective:   Physical Exam  Constitutional: She appears well-developed and well-nourished. No distress.       Obese and well appearing   HENT:  Head: Normocephalic and atraumatic.  Eyes: Conjunctivae and EOM are normal. Pupils are equal, round, and reactive to light.  Neck: Normal range of motion. Neck supple.  Cardiovascular: Normal rate and regular rhythm.   Pulmonary/Chest: Effort normal and breath sounds normal.  Abdominal: Soft. Bowel sounds are normal. She exhibits no distension and no mass. There is tenderness.       Mild suprapubic tenderness  Musculoskeletal: She exhibits no tenderness.       No cva tenderness   Lymphadenopathy:    She has no cervical adenopathy.  Skin: Skin is warm and dry.  Psychiatric: She has a normal mood and affect.          Assessment & Plan:

## 2011-07-01 NOTE — Patient Instructions (Signed)
Drink lots of water  Get some rest this weekend  If nausea or vomiting , higher fever or worse symptoms, call  Update if not starting to improve in 2-3 days  or if worsening   Take the cipro as directed

## 2011-07-03 ENCOUNTER — Telehealth: Payer: Self-pay

## 2011-07-03 NOTE — Telephone Encounter (Signed)
[facility] Triage Call Report Triage Record Num: [id number] Operator: Chevis Pretty Patient Name: Emily Gay Call Date & Time: 07/01/2011 12:27:44PM Patient Phone: [phone] PCP: Illene Regulus Patient Gender: Female PCP Fax : [phone] Patient DOB: [birth year] [facility] Name: [facility] Reason for Call: Caller: [name]/Patient; PCP: Other; CB#: [phone]; Call regarding Urinary Pain/Bleeding; requests appt. States she knows cannot get antibiotics without being seen. Declines triage; appt sched 1245 07/01/11 in office. Protocol(s) Used: Office Note Recommended Outcome per Protocol: Information Noted and Sent to Office Reason for Outcome: Caller information to office Care Advice: ~ 07/01/2011 12:32:15PM Page 1 of 1 [email or web address]

## 2011-07-25 ENCOUNTER — Encounter: Payer: Self-pay | Admitting: Internal Medicine

## 2011-07-26 ENCOUNTER — Other Ambulatory Visit (INDEPENDENT_AMBULATORY_CARE_PROVIDER_SITE_OTHER): Payer: BC Managed Care – PPO

## 2011-07-26 ENCOUNTER — Ambulatory Visit (INDEPENDENT_AMBULATORY_CARE_PROVIDER_SITE_OTHER): Payer: BC Managed Care – PPO | Admitting: Internal Medicine

## 2011-07-26 VITALS — BP 126/80 | HR 110 | Temp 99.9°F | Resp 20 | Wt 291.1 lb

## 2011-07-26 DIAGNOSIS — Z0000 Encounter for general adult medical examination without abnormal findings: Secondary | ICD-10-CM

## 2011-07-26 DIAGNOSIS — R259 Unspecified abnormal involuntary movements: Secondary | ICD-10-CM

## 2011-07-26 DIAGNOSIS — F419 Anxiety disorder, unspecified: Secondary | ICD-10-CM

## 2011-07-26 DIAGNOSIS — N390 Urinary tract infection, site not specified: Secondary | ICD-10-CM

## 2011-07-26 DIAGNOSIS — F329 Major depressive disorder, single episode, unspecified: Secondary | ICD-10-CM

## 2011-07-26 DIAGNOSIS — I10 Essential (primary) hypertension: Secondary | ICD-10-CM

## 2011-07-26 DIAGNOSIS — R209 Unspecified disturbances of skin sensation: Secondary | ICD-10-CM

## 2011-07-26 DIAGNOSIS — F341 Dysthymic disorder: Secondary | ICD-10-CM

## 2011-07-26 LAB — HEPATIC FUNCTION PANEL
ALT: 18 U/L (ref 0–35)
AST: 15 U/L (ref 0–37)
Albumin: 3.9 g/dL (ref 3.5–5.2)
Alkaline Phosphatase: 81 U/L (ref 39–117)
Total Protein: 8.1 g/dL (ref 6.0–8.3)

## 2011-07-26 LAB — COMPREHENSIVE METABOLIC PANEL
ALT: 18 U/L (ref 0–35)
AST: 15 U/L (ref 0–37)
Albumin: 3.9 g/dL (ref 3.5–5.2)
Alkaline Phosphatase: 81 U/L (ref 39–117)
Glucose, Bld: 128 mg/dL — ABNORMAL HIGH (ref 70–99)
Potassium: 5 mEq/L (ref 3.5–5.1)
Sodium: 137 mEq/L (ref 135–145)
Total Bilirubin: 0.4 mg/dL (ref 0.3–1.2)
Total Protein: 8.1 g/dL (ref 6.0–8.3)

## 2011-07-26 LAB — LIPID PANEL
HDL: 44.6 mg/dL (ref >39.00)
Total CHOL/HDL Ratio: 5
Triglycerides: 124 mg/dL (ref 0.0–149.0)

## 2011-07-26 LAB — URINALYSIS, ROUTINE W REFLEX MICROSCOPIC
Bilirubin Urine: NEGATIVE
Ketones, ur: NEGATIVE
Leukocytes, UA: NEGATIVE
Nitrite: NEGATIVE
Urobilinogen, UA: 0.2 (ref 0.0–1.0)
pH: 6 (ref 5.0–8.0)

## 2011-07-26 LAB — CBC WITH DIFFERENTIAL/PLATELET
Basophils Relative: 0.2 % (ref 0.0–3.0)
Eosinophils Absolute: 0.1 10*3/uL (ref 0.0–0.7)
Eosinophils Relative: 0.7 % (ref 0.0–5.0)
Hemoglobin: 12.6 g/dL (ref 12.0–15.0)
Lymphocytes Relative: 12.6 % (ref 12.0–46.0)
MCHC: 33.2 g/dL (ref 30.0–36.0)
MCV: 82.6 fl (ref 78.0–100.0)
Neutro Abs: 9.6 10*3/uL — ABNORMAL HIGH (ref 1.4–7.7)
Neutrophils Relative %: 80.5 % — ABNORMAL HIGH (ref 43.0–77.0)
RBC: 4.59 Mil/uL (ref 3.87–5.11)
WBC: 11.9 10*3/uL — ABNORMAL HIGH (ref 4.5–10.5)

## 2011-07-26 LAB — LDL CHOLESTEROL, DIRECT: Direct LDL: 134.4 mg/dL

## 2011-07-26 MED ORDER — FLUOXETINE HCL 40 MG PO CAPS: 40.0000 mg | ORAL_CAPSULE | Freq: Every day | ORAL | Status: DC

## 2011-07-26 NOTE — Progress Notes (Signed)
Subjective:    Patient ID: Emily Gay, female    DOB: [birth year], 41 y.o.   MRN: [id number]  HPI Mrs. Lehmkuhl presents for an annual exam. However, she has a long litany of issues the most pressing of which is the on-set of which refers to as seizures - episodes of being out of it with no loss of consciousness but she appears "dopey" and unfocused with cognitive slowing, after which she will feel exhausted. The frequency and severity of her symptoms is rest and stress related. She reports that she has noticed cognitive decline: poor memory and function. At the same time she is very emotional and finds that she is often tearful and sad. NCS for fasciculations was negative but she reports that she is still having symptoms and is concerned about an underlying process.   Past Medical History  Diagnosis Date  . Allergic rhinitis   . Hypertension   . Obesity   . Cholelithiasis    Past Surgical History  Procedure Date  . Cholecystectomy    Family History  Problem Relation Age of Onset  . Cancer Mother   . Lung disease Mother   . Arthritis Father   . Hypertension Father   . Stroke Father   . Melanoma Sister   . Fibromyalgia Sister   . Mental illness Other     emotional illness (other blood relative)  . Stroke Other     Stroke (parent and grandparent)  . Heart disease Other     (grandparent)  . Hypertension Other     (other blood relative)  . Arthritis Other     Parent and grandparent   History   Social History  . Marital Status: Married    Spouse Name: N/A    Number of Children: N/A  . Years of Education: N/A   Occupational History  . Not on file.   Social History Main Topics  . Smoking status: Never Smoker   . Smokeless tobacco: Not on file  . Alcohol Use: No  . Drug Use: Not on file  . Sexually Active: Not on file   Other Topics Concern  . 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    Current Outpatient Prescriptions on File Prior to Visit  Medication Sig Dispense Refill  . acetaminophen (TYLENOL) 500 MG tablet Take 500 mg by mouth every 6 (six) hours as needed.      Marland Kitchen ibuprofen (ADVIL,MOTRIN) 600 MG tablet Take 600 mg by mouth every 6 (six) hours as needed. 1 by mouth bid pc x 1 wk then as needed for pain      . levocetirizine (XYZAL) 5 MG tablet TAKE ONE TABLET BY MOUTH DAILY AS NEEDED FOR ALLERGIES  30 tablet  2  . Multiple Vitamin (MULTIVITAMIN) capsule Take 1 capsule by mouth daily.      . naproxen (NAPROSYN) 250 MG tablet Take 250 mg by mouth 2 (two) times daily with a meal.      . FLUoxetine (PROZAC) 40 MG capsule Take 1 capsule (40 mg total) by mouth daily.  30 capsule  3  . HYDROcodone-acetaminophen (NORCO) 5-325 MG per tablet Take by mouth every 6 (six) hours as needed. 1-2 by mouth two times a day as needed for pain         Review of Systems  System review is negative for any constitutional, cardiac, pulmonary, GI or neuro symptoms or complaints other than as described in the HPI.     Objective:   Physical Exam Filed Vitals:   07/26/11 1335  BP: 126/80  Pulse: 110  Temp: 99.9 F (37.7 C)  Resp: 20   Wt Readings from Last 3 Encounters:  07/26/11 291 lb 1.3 oz (132.033 kg)  07/01/11 291 lb (131.997 kg)  03/29/10 283 lb (128.368 kg)   Gen'l- obese white woman in emotional distress HEENT- Frederika/AT, EAC'TM normal, oropharynx w/o lesions, dentition in good repair, C&S clear, PERRLA Neck- supple, no thyromegaly Nodes - negative cervical and supraclavicular Cor- 2+ radial, DP pulses, RRR w/o murmur Pulm - normal respiratoins Abd- morbidly obese,  Pelvic/Rectal deferred Ext - no deformity, MAE Neuro - A&O x 3, CN II-XII normal, DTR's normal at biceps, trace to 1_ at patellar tendon, MS 5/5, normal gait and station. Derm - clear.   Lab Results    Component Value Date   WBC 11.9* 07/26/2011   HGB 12.6 07/26/2011   HCT 37.9 07/26/2011   PLT 341.0 07/26/2011   GLUCOSE 128* 07/26/2011   CHOL 201* 07/26/2011   TRIG 124.0 07/26/2011   HDL 44.60 07/26/2011   LDLDIRECT 134.4 07/26/2011   LDLCALC 113* 10/09/2008        ALT 18 07/26/2011   AST 15 07/26/2011        NA 137 07/26/2011   K 5.0 07/26/2011   CL 99 07/26/2011   CREATININE 0.6 07/26/2011   BUN 12 07/26/2011   CO2 28 07/26/2011   TSH 2.67 07/26/2011        Assessment & Plan:  Seizures - patient complains of "seizures" which do not involve loss of consciousness. Report may be consistent with "Absence" type events.  Plan - EEG and sleep deprived EEG  Refer for re-consult to Dr. Anne Hahn

## 2011-07-28 ENCOUNTER — Encounter: Payer: Self-pay | Admitting: Internal Medicine

## 2011-07-28 NOTE — Assessment & Plan Note (Signed)
Patient with a BMI = 50. Obesity is a major health risk and she is advised to consider bariatric surgery. At this time she is reluctant to consider surgical intervention. She is advised that the process of screening and insurance approval for surgery can take up to 6-12 months.  Plan- she is asked to give consideration to Bariatric surgery.

## 2011-07-28 NOTE — Assessment & Plan Note (Signed)
BP Readings from Last 3 Encounters:  07/26/11 126/80  07/01/11 110/80  03/29/10 122/78   Good control on her present regimen.

## 2011-07-28 NOTE — Assessment & Plan Note (Signed)
Patient reports that she has significant, life effecting anxiety. ON questioning she has irritability, perseveration, feeling helpless/hopeless at times, sadness with emotional lability, periods of anhedonia all c/w depression.  Plan For anxiety w/ depression features will start fluoxetine 40 mg daily  F/u OV 3-4 weeks.

## 2011-07-28 NOTE — Assessment & Plan Note (Addendum)
Interval history significant for neurologic symptoms as primary issue. She reports that she is current with Gyn - need records. Physical exam - non focal but notable for obesity. Lab - minor elevation of serum glucose and LDL cholesterol - below threshold for medical treatment, needs to be addressed by life-style management: low fat, low sugar, carb restricted diet and some form of regular aerobic exercise, i.e. Water aerobics, walking, etc.  In summary - a young woman with new neuro symptoms which need further evaluation and a need for life-style management of obesity, mild hyperglycemia and hyperlipidemia. She is advised that a health investment now will pay HUGE dividends in future health status.

## 2011-07-28 NOTE — Assessment & Plan Note (Signed)
Patient reports persistent and progressive involuntary muscle movement  Plan Re consult with neuro - Dr. Lesia Sago

## 2011-07-31 ENCOUNTER — Encounter: Payer: Self-pay | Admitting: Internal Medicine

## 2011-08-07 ENCOUNTER — Telehealth: Payer: Self-pay | Admitting: Internal Medicine

## 2011-08-07 NOTE — Telephone Encounter (Signed)
Both studies please.

## 2011-08-07 NOTE — Telephone Encounter (Signed)
Received call from Diane stating need to clarify referral. Received referral for EEG. Pt states she suppose to have a sleep deprive EEG & EEG. Need to clarify if md want both need both orders fax over.Marland KitchenMarland Kitchen7/15/13@8 ;58am/LMB

## 2011-08-28 ENCOUNTER — Other Ambulatory Visit: Payer: Self-pay | Admitting: Neurology

## 2011-08-28 DIAGNOSIS — R253 Fasciculation: Secondary | ICD-10-CM

## 2011-08-28 DIAGNOSIS — R410 Disorientation, unspecified: Secondary | ICD-10-CM

## 2011-08-28 DIAGNOSIS — R209 Unspecified disturbances of skin sensation: Secondary | ICD-10-CM

## 2011-09-03 ENCOUNTER — Ambulatory Visit
Admission: RE | Admit: 2011-09-03 | Discharge: 2011-09-03 | Disposition: A | Discharge status: Home / Self Care | Payer: BC Managed Care – PPO | Source: Ambulatory Visit | Attending: Neurology | Admitting: Neurology

## 2011-09-03 DIAGNOSIS — R410 Disorientation, unspecified: Secondary | ICD-10-CM

## 2011-09-03 DIAGNOSIS — R253 Fasciculation: Secondary | ICD-10-CM

## 2011-09-03 DIAGNOSIS — R209 Unspecified disturbances of skin sensation: Secondary | ICD-10-CM

## 2011-11-07 ENCOUNTER — Encounter: Payer: Self-pay | Admitting: Internal Medicine

## 2011-11-07 ENCOUNTER — Ambulatory Visit (INDEPENDENT_AMBULATORY_CARE_PROVIDER_SITE_OTHER): Payer: BC Managed Care – PPO | Admitting: Internal Medicine

## 2011-11-07 VITALS — BP 132/78 | HR 88 | Temp 98.4°F | Ht 64.0 in | Wt 294.2 lb

## 2011-11-07 DIAGNOSIS — R102 Pelvic and perineal pain: Secondary | ICD-10-CM

## 2011-11-07 DIAGNOSIS — N949 Unspecified condition associated with female genital organs and menstrual cycle: Secondary | ICD-10-CM

## 2011-11-08 NOTE — Progress Notes (Signed)
  Subjective:    Patient ID: Emily Gay, female    DOB: November 05, 1970, 41 y.o.   MRN: [id number]  HPI  Patient left before being seen, will reschedule  Review of Systems     Objective:   Physical Exam        Assessment & Plan:

## 2011-11-13 ENCOUNTER — Encounter: Payer: Self-pay | Admitting: Internal Medicine

## 2011-11-13 ENCOUNTER — Ambulatory Visit (INDEPENDENT_AMBULATORY_CARE_PROVIDER_SITE_OTHER): Payer: BC Managed Care – PPO | Admitting: Internal Medicine

## 2011-11-13 VITALS — BP 126/70 | HR 80 | Temp 98.6°F | Resp 14 | Ht 64.0 in | Wt 293.2 lb

## 2011-11-13 DIAGNOSIS — Z23 Encounter for immunization: Secondary | ICD-10-CM

## 2011-11-13 DIAGNOSIS — R109 Unspecified abdominal pain: Secondary | ICD-10-CM

## 2011-11-13 DIAGNOSIS — R1031 Right lower quadrant pain: Secondary | ICD-10-CM

## 2011-11-13 NOTE — Progress Notes (Signed)
Subjective:    Patient ID: Emily Gay, female    DOB: 11/23/1970, 41 y.o.   MRN: [id number]  HPI Emily Gay has had intermittent episodes of pain in the right groin and this has been associated with low grade fever and nausea. She is concerned for lymphadenitis similar to a problem she had in the axilla -"Cat scratch fever." She does have a h/o pain in the right hip and feels like she has some type of neuropathic pain. When this flares she tries otc NSAIDs. She has not felt any descrete swellings/nodes. She finds that the hip pain limits her activities. Chart reviewed: last hip films July '11 - well preserved joint space per report. Images are difficult to interpret.  Past Medical History  Diagnosis Date  . Allergic rhinitis   . Hypertension   . Obesity   . Cholelithiasis    Past Surgical History  Procedure Date  . Cholecystectomy    Family History  Problem Relation Age of Onset  . Cancer Mother   . Lung disease Mother   . Arthritis Father   . Hypertension Father   . Stroke Father   . Melanoma Sister   . Fibromyalgia Sister   . Mental illness Other     emotional illness (other blood relative)  . Stroke Other     Stroke (parent and grandparent)  . Heart disease Other     (grandparent)  . Hypertension Other     (other blood relative)  . Arthritis Other     Parent and grandparent   History   Social History  . Marital Status: Married    Spouse Name: N/A    Number of Children: N/A  . Years of Education: N/A   Occupational History  . Not on file.   Social History Main Topics  . Smoking status: Never Smoker   . Smokeless tobacco: Not on file  . Alcohol Use: No  . Drug Use: Not on file  . Sexually Active: Not on file   Other Topics Concern  . 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    Current Outpatient Prescriptions on File Prior to Visit  Medication Sig Dispense Refill  . acetaminophen (TYLENOL) 500 MG tablet Take 500 mg by mouth every 6 (six) hours as needed.      Marland Kitchen HYDROcodone-acetaminophen (NORCO) 5-325 MG per tablet Take by mouth every 6 (six) hours as needed. 1-2 by mouth two times a day as needed for pain      . ibuprofen (ADVIL,MOTRIN) 600 MG tablet Take 600 mg by mouth every 6 (six) hours as needed. 1 by mouth bid pc x 1 wk then as needed for pain      . levocetirizine (XYZAL) 5 MG tablet TAKE ONE TABLET BY MOUTH DAILY AS NEEDED FOR ALLERGIES  30 tablet  2  . Multiple Vitamin (MULTIVITAMIN) capsule Take 1 capsule by mouth daily.      . naproxen (NAPROSYN) 250 MG tablet Take 250 mg by mouth 2 (two) times daily with a meal.      . FLUoxetine (PROZAC) 40 MG capsule Take 1 capsule (40 mg total) by mouth daily.  30 capsule  3      Review of Systems System review is negative for any constitutional, cardiac, pulmonary, GI or neuro symptoms or complaints other  than as described in the HPI.     Objective:   Physical Exam Filed Vitals:   11/13/11 1609  BP: 126/70  Pulse: 80  Temp: 98.6 F (37 C)  Resp: 14   Obese white woman in no acute distress HEENT- C&S clear Nodes - no inguinal adenopathy right Cor - 2+ radial pulse Pulm - normal respirations Abd - obese, BS+., tender to deep palpation RLQ without guarding or rebound MSK- tender to AP pressure right hip, to pressure against the greater trochanter.       Assessment & Plan:  1. 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 Previous hip films in '11 were read as normal but there can still be OA in the joint.  Plan For acute pain ok to take 2 aleve (440 mg) every 12 hours  Supplement aleve with tylenol 1000 mg three times a day when having flare of pain  Physical therapy - try going to [employer] search hip pain and exercise. Can also consider PT referral  2. Abdominal  pain with fever - not in the right location for Gyn issues. Need to rule out infection, so at the next episode of pain with fever please come to the lab for a CBCD. If the white blood count is elevated will follow with a CT abdomen/pelvis to rule out any source of intra- abdominal infection.

## 2011-11-13 NOTE — Patient Instructions (Addendum)
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 Previous hip films in '11 were read as normal but there can still be OA in the joint.  Plan For acute pain ok to take 2 aleve (440 mg) every 12 hours  Supplement aleve with tylenol 1000 mg three times a day when having flare of pain  Physical therapy - try going to [employer] search hip pain and exercise. Can also consider PT referral  Abdominal pain with fever - not in the right location for Gyn issues. Need to rule out infection, so at the next episode of pain with fever please come to the lab for a CBCD. If the white blood count is elevated will follow with a CT abdomen/pelvis to rule out any source of intra- abdominal infection.

## 2011-11-27 ENCOUNTER — Other Ambulatory Visit (INDEPENDENT_AMBULATORY_CARE_PROVIDER_SITE_OTHER): Payer: BC Managed Care – PPO

## 2011-11-27 DIAGNOSIS — R109 Unspecified abdominal pain: Secondary | ICD-10-CM

## 2011-11-27 LAB — CBC WITH DIFFERENTIAL/PLATELET
Basophils Relative: 0.9 % (ref 0.0–3.0)
Eosinophils Absolute: 0.2 10*3/uL (ref 0.0–0.7)
Eosinophils Relative: 1.9 % (ref 0.0–5.0)
HCT: 37.2 % (ref 36.0–46.0)
Hemoglobin: 12.4 g/dL (ref 12.0–15.0)
MCHC: 33.4 g/dL (ref 30.0–36.0)
MCV: 83.1 fl (ref 78.0–100.0)
Monocytes Absolute: 0.6 10*3/uL (ref 0.1–1.0)
Neutro Abs: 8 10*3/uL — ABNORMAL HIGH (ref 1.4–7.7)
Neutrophils Relative %: 71.1 % (ref 43.0–77.0)
RBC: 4.47 Mil/uL (ref 3.87–5.11)
WBC: 11.2 10*3/uL — ABNORMAL HIGH (ref 4.5–10.5)

## 2011-11-29 ENCOUNTER — Telehealth: Payer: Self-pay | Admitting: *Deleted

## 2011-11-29 DIAGNOSIS — R1031 Right lower quadrant pain: Secondary | ICD-10-CM

## 2011-11-29 DIAGNOSIS — I10 Essential (primary) hypertension: Secondary | ICD-10-CM

## 2011-11-29 NOTE — Telephone Encounter (Signed)
Returned patient call. Received message of normal WBC lab results. Patient states still with pain in the right groin area and is constant but seems to be more pain full after eating. Pain scale of 4 to 6 stated. Would like to know if there are anymore test that could be done to see what is causing this. CB#[phone]

## 2011-11-29 NOTE — Telephone Encounter (Signed)
Message copied by Elnora Morrison on Wed Nov 29, 2011 11:30 AM ------      Message from: Illene Regulus E      Created: Wed Nov 29, 2011  3:40 AM       Call pateint - CBC is normal - WBC 11.2 but this is her normal range

## 2011-11-29 NOTE — Telephone Encounter (Signed)
Left message on home # voice mail of normal lab results, CBC, normal. All other CB# not in service. Patient to call back if any questions.

## 2011-11-30 ENCOUNTER — Other Ambulatory Visit (INDEPENDENT_AMBULATORY_CARE_PROVIDER_SITE_OTHER): Payer: BC Managed Care – PPO

## 2011-11-30 ENCOUNTER — Ambulatory Visit (INDEPENDENT_AMBULATORY_CARE_PROVIDER_SITE_OTHER)
Admission: RE | Admit: 2011-11-30 | Discharge: 2011-11-30 | Disposition: A | Discharge status: Home / Self Care | Payer: BC Managed Care – PPO | Source: Ambulatory Visit | Attending: Internal Medicine | Admitting: Internal Medicine

## 2011-11-30 ENCOUNTER — Telehealth: Payer: Self-pay | Admitting: *Deleted

## 2011-11-30 DIAGNOSIS — I10 Essential (primary) hypertension: Secondary | ICD-10-CM

## 2011-11-30 DIAGNOSIS — R209 Unspecified disturbances of skin sensation: Secondary | ICD-10-CM

## 2011-11-30 DIAGNOSIS — R259 Unspecified abnormal involuntary movements: Secondary | ICD-10-CM

## 2011-11-30 DIAGNOSIS — R1031 Right lower quadrant pain: Secondary | ICD-10-CM

## 2011-11-30 MED ORDER — IOHEXOL 300 MG/ML  SOLN: 100.0000 mL | Freq: Once | INTRAMUSCULAR | Status: AC | PRN

## 2011-11-30 MED ORDER — IOHEXOL 300 MG/ML  SOLN
100.0000 mL | Freq: Once | INTRAMUSCULAR | Status: AC | PRN
  Administered 2011-11-30: 100 mL via INTRAVENOUS

## 2011-11-30 NOTE — Telephone Encounter (Signed)
Have order CT abdomen and pelvis. Will need Bmet due to admin of contrast - may come to the lab. CT order has been sent to [facility]

## 2011-11-30 NOTE — Telephone Encounter (Signed)
Patient aware of CT scan and lab orders per [facility]

## 2011-11-30 NOTE — Telephone Encounter (Signed)
Left message on patient cell # and Home #of test ordered per Dr. Debby Bud. Lab work prior to CT of abdomen to be done. [facility] will notify her when this is scheduled.

## 2011-12-01 ENCOUNTER — Telehealth: Payer: Self-pay | Admitting: *Deleted

## 2011-12-01 LAB — COMPREHENSIVE METABOLIC PANEL
AST: 16 U/L (ref 0–37)
Albumin: 3.8 g/dL (ref 3.5–5.2)
Alkaline Phosphatase: 74 U/L (ref 39–117)
BUN: 14 mg/dL (ref 6–23)
Calcium: 8.8 mg/dL (ref 8.4–10.5)
Creatinine, Ser: 0.6 mg/dL (ref 0.4–1.2)
Glucose, Bld: 88 mg/dL (ref 70–99)
Potassium: 4.6 mEq/L (ref 3.5–5.1)

## 2011-12-01 NOTE — Telephone Encounter (Signed)
Notified pt husband with md response.../lmb 

## 2011-12-01 NOTE — Telephone Encounter (Signed)
Ct scan was normal

## 2011-12-01 NOTE — Telephone Encounter (Signed)
Left msg on triage stating wife had ct scan done yesterday . Requesting results. She is still c/o of pain & she is not eating. MD is out of office. Pls advise,,,,/lmb

## 2011-12-04 ENCOUNTER — Encounter: Payer: Self-pay | Admitting: Internal Medicine

## 2011-12-05 ENCOUNTER — Encounter: Payer: Self-pay | Admitting: Internal Medicine

## 2012-08-21 ENCOUNTER — Other Ambulatory Visit: Payer: Self-pay

## 2012-08-21 ENCOUNTER — Other Ambulatory Visit (HOSPITAL_COMMUNITY)
Admission: RE | Admit: 2012-08-21 | Discharge: 2012-08-21 | Disposition: A | Discharge status: Home / Self Care | Payer: BC Managed Care – PPO | Source: Ambulatory Visit | Attending: Internal Medicine | Admitting: Internal Medicine

## 2012-08-21 DIAGNOSIS — Z01419 Encounter for gynecological examination (general) (routine) without abnormal findings: Secondary | ICD-10-CM | POA: Insufficient documentation

## 2012-11-18 ENCOUNTER — Other Ambulatory Visit: Payer: Self-pay | Admitting: Physician Assistant

## 2012-11-18 ENCOUNTER — Ambulatory Visit (HOSPITAL_COMMUNITY)
Admission: RE | Admit: 2012-11-18 | Discharge: 2012-11-18 | Disposition: A | Discharge status: Home / Self Care | Payer: BC Managed Care – PPO | Source: Ambulatory Visit | Attending: Physician Assistant | Admitting: Physician Assistant

## 2012-11-18 DIAGNOSIS — Z9089 Acquired absence of other organs: Secondary | ICD-10-CM | POA: Insufficient documentation

## 2012-11-18 DIAGNOSIS — M545 Low back pain, unspecified: Secondary | ICD-10-CM | POA: Insufficient documentation

## 2012-11-18 DIAGNOSIS — M51379 Other intervertebral disc degeneration, lumbosacral region without mention of lumbar back pain or lower extremity pain: Secondary | ICD-10-CM | POA: Insufficient documentation

## 2012-11-18 DIAGNOSIS — Q762 Congenital spondylolisthesis: Secondary | ICD-10-CM | POA: Insufficient documentation

## 2012-11-18 DIAGNOSIS — M47817 Spondylosis without myelopathy or radiculopathy, lumbosacral region: Secondary | ICD-10-CM | POA: Insufficient documentation

## 2012-11-18 DIAGNOSIS — M5137 Other intervertebral disc degeneration, lumbosacral region: Secondary | ICD-10-CM | POA: Insufficient documentation

## 2012-11-21 ENCOUNTER — Other Ambulatory Visit: Payer: Self-pay | Admitting: Internal Medicine

## 2012-11-21 DIAGNOSIS — M5410 Radiculopathy, site unspecified: Secondary | ICD-10-CM

## 2012-11-21 DIAGNOSIS — R202 Paresthesia of skin: Secondary | ICD-10-CM

## 2012-11-28 ENCOUNTER — Other Ambulatory Visit: Payer: Self-pay

## 2012-12-04 ENCOUNTER — Ambulatory Visit
Admission: RE | Admit: 2012-12-04 | Discharge: 2012-12-04 | Disposition: A | Discharge status: Home / Self Care | Payer: BC Managed Care – PPO | Source: Ambulatory Visit | Attending: Internal Medicine | Admitting: Internal Medicine

## 2012-12-04 DIAGNOSIS — M5410 Radiculopathy, site unspecified: Secondary | ICD-10-CM

## 2012-12-04 DIAGNOSIS — R202 Paresthesia of skin: Secondary | ICD-10-CM

## 2012-12-09 ENCOUNTER — Telehealth: Payer: Self-pay | Admitting: *Deleted

## 2012-12-09 DIAGNOSIS — G8929 Other chronic pain: Secondary | ICD-10-CM

## 2012-12-09 MED ORDER — PREDNISONE 20 MG PO TABS: ORAL_TABLET | ORAL | Status: DC

## 2012-12-09 NOTE — Telephone Encounter (Signed)
RESULTS MRI / & REFILL ON PREDNISONE?

## 2012-12-09 NOTE — Telephone Encounter (Signed)
Patient aware of MRI results and that a Ortho referral will be done for Ortho

## 2012-12-09 NOTE — Telephone Encounter (Signed)
Patient aware of MRI results and that a Ortho referral will be ordered

## 2012-12-13 ENCOUNTER — Ambulatory Visit: Payer: Self-pay | Admitting: Physician Assistant

## 2012-12-18 ENCOUNTER — Other Ambulatory Visit: Payer: Self-pay | Admitting: Physician Assistant

## 2012-12-18 MED ORDER — CLONAZEPAM 1 MG PO TABS: 1.0000 mg | ORAL_TABLET | Freq: Every evening | ORAL | Status: DC | PRN

## 2013-02-03 ENCOUNTER — Other Ambulatory Visit: Payer: Self-pay | Admitting: Physician Assistant

## 2013-02-03 MED ORDER — CLONAZEPAM 1 MG PO TABS: 1.0000 mg | ORAL_TABLET | Freq: Every evening | ORAL | Status: DC | PRN

## 2013-02-21 ENCOUNTER — Ambulatory Visit (INDEPENDENT_AMBULATORY_CARE_PROVIDER_SITE_OTHER): Payer: BC Managed Care – PPO | Admitting: Physician Assistant

## 2013-02-21 ENCOUNTER — Encounter: Payer: Self-pay | Admitting: Physician Assistant

## 2013-02-21 VITALS — BP 128/80 | HR 88 | Temp 97.5°F | Resp 16 | Ht 63.0 in | Wt 296.0 lb

## 2013-02-21 DIAGNOSIS — E785 Hyperlipidemia, unspecified: Secondary | ICD-10-CM

## 2013-02-21 DIAGNOSIS — I10 Essential (primary) hypertension: Secondary | ICD-10-CM

## 2013-02-21 DIAGNOSIS — E559 Vitamin D deficiency, unspecified: Secondary | ICD-10-CM

## 2013-02-21 DIAGNOSIS — R5381 Other malaise: Secondary | ICD-10-CM

## 2013-02-21 DIAGNOSIS — E782 Mixed hyperlipidemia: Secondary | ICD-10-CM

## 2013-02-21 DIAGNOSIS — R5383 Other fatigue: Secondary | ICD-10-CM

## 2013-02-21 DIAGNOSIS — R7309 Other abnormal glucose: Secondary | ICD-10-CM

## 2013-02-21 DIAGNOSIS — M2550 Pain in unspecified joint: Secondary | ICD-10-CM

## 2013-02-21 DIAGNOSIS — R7303 Prediabetes: Secondary | ICD-10-CM

## 2013-02-21 DIAGNOSIS — Z79899 Other long term (current) drug therapy: Secondary | ICD-10-CM

## 2013-02-21 LAB — HEMOGLOBIN A1C
Hgb A1c MFr Bld: 5.6 % (ref <5.7)
MEAN PLASMA GLUCOSE: 114 mg/dL (ref <117)

## 2013-02-21 MED ORDER — CELECOXIB 200 MG PO CAPS: 200.0000 mg | ORAL_CAPSULE | Freq: Two times a day (BID) | ORAL | Status: DC

## 2013-02-21 MED ORDER — PREGABALIN 75 MG PO CAPS: ORAL_CAPSULE | ORAL | Status: DC

## 2013-02-21 MED ORDER — PANTOPRAZOLE SODIUM 40 MG PO TBEC: 40.0000 mg | DELAYED_RELEASE_TABLET | Freq: Every day | ORAL | Status: DC

## 2013-02-21 NOTE — Progress Notes (Signed)
HPI Patient presents for 3 month follow up with hypertension, hyperlipidemia, prediabetes and vitamin D. Patient's blood pressure has been controlled at home, today their BP is BP: 128/80 mmHg  Patient denies chest pain, shortness of breath, dizziness.  Patient's cholesterol is diet controlled. The cholesterol last visit was 88.  The patient has been working on diet and exercise for prediabetes, and denies changes in vision, polys, and paresthesias. 5.9. Weight is down 7lbs.  Patient is on Vitamin D supplement. Vitamin D 52.  Mag 1.8.  Patient was having lower back pain had Xray and MRI of back pain. She has seen ortho, did steroid injection, PT and helped 50% of the pain, she has another steroid shot scheduled in February for different level. Takes Voltaren and tramadol as needed. She continues on Lyrica 168m TID but she still has some dizziness/fogginess with this.  Current Medications:   acetaminophen 500 MG tablet  Commonly known as:  TYLENOL  Take 500 mg by mouth every 6 (six) hours as needed.     celecoxib 200 MG capsule  Commonly known as:  CELEBREX  Take 1 capsule (200 mg total) by mouth 2 (two) times daily.     fluticasone 50 MCG/ACT nasal spray  Commonly known as:  FLONASE  Place 2 sprays into both nostrils at bedtime.     levocetirizine 5 MG tablet  Commonly known as:  XYZAL  TAKE ONE TABLET BY MOUTH DAILY AS NEEDED FOR ALLERGIES     multivitamin capsule  Take 1 capsule by mouth daily.     pantoprazole 40 MG tablet  Commonly known as:  PROTONIX  Take 1 tablet (40 mg total) by mouth daily.     pregabalin 75 MG capsule  Commonly known as:  LYRICA  1 pill 3 times a day and then 2 pills at night for pain     PRILOSEC PO  Take by mouth daily.     ROBAXIN PO  Take by mouth 2 (two) times daily.     TRAMADOL HCL PO  Take 50 mg by mouth. Take 1/2 to 1 tablet every 4-6 hours as needed     VOLTAREN PO  Take by mouth 2 (two) times daily.        Medical History:   Past Medical History  Diagnosis Date  . Allergic rhinitis   . Hypertension   . Obesity   . Cholelithiasis   . Prediabetes    Allergies: No Known Allergies  ROS Constitutional: Denies fever, chills, headaches, insomnia, fatigue, night sweats Eyes: Denies redness, blurred vision, diplopia, discharge, itchy, watery eyes.  ENT: Denies congestion, post nasal drip, sore throat, earache, dental pain, Tinnitus, Vertigo, Sinus pain, snoring.  Cardio: Denies chest pain, palpitations, irregular heartbeat, dyspnea, diaphoresis, orthopnea, PND, claudication, edema Respiratory: denies cough, shortness of breath, wheezing.  Gastrointestinal: + GERD Denies dysphagia,  AB pain/ cramps, N/V, diarrhea, constipation, hematemesis, melena, hematochezia,  hemorrhoids Genitourinary: Denies dysuria, frequency, urgency, nocturia, hesitancy, discharge, hematuria, flank pain Musculoskeletal: + LBP Denies myalgia, stiffness, pain, swelling and strain/sprain. Skin: Denies pruritis, rash, changing in skin lesion Neuro: Denies Weakness, tremor, incoordination, spasms, pain Psychiatric: Denies confusion, memory loss, sensory loss Endocrine: Denies change in weight, skin, hair change, nocturia Diabetic Polys, Denies visual blurring, hyper /hypo glycemic episodes, and paresthesia, Heme/Lymph: Denies Excessive bleeding, bruising, enlarged lymph nodes  Family history- Review and unchanged Social history- Review and unchanged Physical Exam: Filed Vitals:   02/21/13 1113  BP: 128/80  Pulse: 88  Temp: 97.5 F (  36.4 C)  Resp: 16   Filed Weights   02/21/13 1113  Weight: 296 lb (134.265 kg)   General Appearance: Well nourished, in no apparent distress. Eyes: PERRLA, EOMs, conjunctiva no swelling or erythema Sinuses: No Frontal/maxillary tenderness ENT/Mouth: Ext aud canals clear, TMs without erythema, bulging. No erythema, swelling, or exudate on post pharynx.  Tonsils not swollen or erythematous. Hearing normal.   Neck: Supple, thyroid normal.  Respiratory: Respiratory effort normal, BS equal bilaterally without rales, rhonchi, wheezing or stridor.  Cardio: RRR with no MRGs. Brisk peripheral pulses with 1-2 + edema Abdomen: Soft, morbidly obese + BS.  Non tender, no guarding, rebound, hernias, masses. Lymphatics: Non tender without lymphadenopathy.  Musculoskeletal: Full ROM, pain with change in position. Skin: Warm, dry without rashes, lesions, ecchymosis.  Neuro: Cranial nerves intact. Normal muscle tone, no cerebellar symptoms. Sensation intact.  Psych: Awake and oriented X 3, normal affect, Insight and Judgment appropriate.   Assessment and Plan:  Hypertension: Continue medication, monitor blood pressure at home.  Continue DASH diet. Cholesterol: Continue diet and exercise. Check cholesterol.  Pre-diabetes-Continue diet and exercise. Check A1C Vitamin D Def- check level and continue medications.  Pain- Celebrex 200 samples and RX/card given, decrease Lyrica to 75 TID and 2 at night.  GERD- suggest doing protonix 7m  Chronic joint pain- consistently elevated ESR (40's), + ANA, negative AntiDNA but the patient would like a referral to [facility] for possible RF, etc.  OSA- likely still needs sleep study will call today.   OVER 40 minutes of exam, counseling, chart review, referral performed Continue diet and meds as discussed. Further disposition pending results of labs.  CVicie Mutters11:25 AM

## 2013-02-21 NOTE — Patient Instructions (Addendum)
Diet for Gastroesophageal Reflux Disease, Adult Reflux (acid reflux) is when acid from your stomach flows up into the esophagus. When acid comes in contact with the esophagus, the acid causes irritation and soreness (inflammation) in the esophagus. When reflux happens often or so severely that it causes damage to the esophagus, it is called gastroesophageal reflux disease (GERD). Nutrition therapy can help ease the discomfort of GERD. FOODS OR DRINKS TO AVOID OR LIMIT  Smoking or chewing tobacco. Nicotine is one of the most potent stimulants to acid production in the gastrointestinal tract.  Caffeinated and decaffeinated coffee and black tea.  Regular or low-calorie carbonated beverages or energy drinks (caffeine-free carbonated beverages are allowed).   Strong spices, such as black pepper, white pepper, red pepper, cayenne, curry powder, and chili powder.  Peppermint or spearmint.  Chocolate.  High-fat foods, including meats and fried foods. Extra added fats including oils, butter, salad dressings, and nuts. Limit these to less than 8 tsp per day.  Fruits and vegetables if they are not tolerated, such as citrus fruits or tomatoes.  Alcohol.  Any food that seems to aggravate your condition. If you have questions regarding your diet, call your caregiver or a registered dietitian. OTHER THINGS THAT MAY HELP GERD INCLUDE:   Eating your meals slowly, in a relaxed setting.  Eating 5 to 6 small meals per day instead of 3 large meals.  Eliminating food for a period of time if it causes distress.  Not lying down until 3 hours after eating a meal.  Keeping the head of your bed raised 6 to 9 inches (15 to 23 cm) by using a foam wedge or blocks under the legs of the bed. Lying flat may make symptoms worse.  Being physically active. Weight loss may be helpful in reducing reflux in overweight or obese adults.  Wear loose fitting clothing     Bad carbs also include fruit juice, alcohol,  and sweet tea. These are empty calories that do not signal to your brain that you are full.   Please remember the good carbs are still carbs which convert into sugar. So please measure them out no more than 1/2-1 cup of rice, oatmeal, pasta, and beans.  Veggies are however free foods! Pile them on.   I like lean protein at every meal such as chicken, [place], pork chops, cottage cheese, etc. Just do not fry these meats and please center your meal around vegetable, the meats should be a side dish.   No all fruit is created equal. Please see the list below, the fruit at the bottom is higher in sugars than the fruit at the top   Sleep Apnea  Sleep apnea is a sleep disorder characterized by abnormal pauses in breathing while you sleep. When your breathing pauses, the level of oxygen in your blood decreases. This causes you to move out of deep sleep and into light sleep. As a result, your quality of sleep is poor, and the system that carries your blood throughout your body (cardiovascular system) experiences stress. If sleep apnea remains untreated, the following conditions can develop:  High blood pressure (hypertension).  Coronary artery disease.  Inability to achieve or maintain an erection (impotence).  Impairment of your thought process (cognitive dysfunction). There are three types of sleep apnea: 1. Obstructive sleep apnea Pauses in breathing during sleep because of a blocked airway. 2. Central sleep apnea Pauses in breathing during sleep because the area of the brain that controls your breathing does not  send the correct signals to the muscles that control breathing. 3. Mixed sleep apnea A combination of both obstructive and central sleep apnea. RISK FACTORS The following risk factors can increase your risk of developing sleep apnea:  Being overweight.  Smoking.  Having narrow passages in your nose and throat.  Being of older age.  Being female.  Alcohol use.  Sedative and  tranquilizer use.  Ethnicity. Among individuals younger than 35 years, African Americans are at increased risk of sleep apnea. SYMPTOMS   Difficulty staying asleep.  Daytime sleepiness and fatigue.  Loss of energy.  Irritability.  Loud, heavy snoring.  Morning headaches.  Trouble concentrating.  Forgetfulness.  Decreased interest in sex. DIAGNOSIS  In order to diagnose sleep apnea, your caregiver will perform a physical examination. Your caregiver may suggest that you take a home sleep test. Your caregiver may also recommend that you spend the night in a sleep lab. In the sleep lab, several monitors record information about your heart, lungs, and brain while you sleep. Your leg and arm movements and blood oxygen level are also recorded. TREATMENT The following actions may help to resolve mild sleep apnea:  Sleeping on your side.   Using a decongestant if you have nasal congestion.   Avoiding the use of depressants, including alcohol, sedatives, and narcotics.   Losing weight and modifying your diet if you are overweight. There also are devices and treatments to help open your airway:  Oral appliances. These are custom-made mouthpieces that shift your lower jaw forward and slightly open your bite. This opens your airway.  Devices that create positive airway pressure. This positive pressure "splints" your airway open to help you breathe better during sleep. The following devices create positive airway pressure:  Continuous positive airway pressure (CPAP) device. The CPAP device creates a continuous level of air pressure with an air pump. The air is delivered to your airway through a mask while you sleep. This continuous pressure keeps your airway open.  Nasal expiratory positive airway pressure (EPAP) device. The EPAP device creates positive air pressure as you exhale. The device consists of single-use valves, which are inserted into each nostril and held in place by  adhesive. The valves create very little resistance when you inhale but create much more resistance when you exhale. That increased resistance creates the positive airway pressure. This positive pressure while you exhale keeps your airway open, making it easier to breath when you inhale again.  Bilevel positive airway pressure (BPAP) device. The BPAP device is used mainly in patients with central sleep apnea. This device is similar to the CPAP device because it also uses an air pump to deliver continuous air pressure through a mask. However, with the BPAP machine, the pressure is set at two different levels. The pressure when you exhale is lower than the pressure when you inhale.  Surgery. Typically, surgery is only done if you cannot comply with less invasive treatments or if the less invasive treatments do not improve your condition. Surgery involves removing excess tissue in your airway to create a wider passage way. Document Released: 12/30/2001 Document Revised: 05/06/2012 Document Reviewed: 05/18/2011 [facility] Patient Information 2014 [place], [place].  We are starting you on Metformin to prevent or treat diabetes. Metformin does not cause low blood sugars. In order to create energy your cells need insulin and sugar but sometime your cells do not accept the insulin and this can cause increased sugars and decreased energy. The Metformin helps your cells accept insulin and  the sugar to give you more energy.   The two most common side effects are nausea and diarrhea, follow these rules to avoid it! You can take imodium per box instructions when starting metformin if needed.   Rules of metformin: 1) start out slow with only one pill daily. Our goal for you is 4 pills a day or 2000mg  total.  2) take with your largest meal. 3) Take with least amount of carbs.   Call if you have any problems.

## 2013-02-22 LAB — CBC WITH DIFFERENTIAL/PLATELET
BASOS ABS: 0 10*3/uL (ref 0.0–0.1)
BASOS PCT: 0 % (ref 0–1)
EOS ABS: 0.2 10*3/uL (ref 0.0–0.7)
EOS PCT: 2 % (ref 0–5)
HEMATOCRIT: 36.1 % (ref 36.0–46.0)
Hemoglobin: 12.2 g/dL (ref 12.0–15.0)
Lymphocytes Relative: 19 % (ref 12–46)
Lymphs Abs: 2 10*3/uL (ref 0.7–4.0)
MCH: 27.5 pg (ref 26.0–34.0)
MCHC: 33.8 g/dL (ref 30.0–36.0)
MCV: 81.3 fL (ref 78.0–100.0)
MONO ABS: 0.6 10*3/uL (ref 0.1–1.0)
Monocytes Relative: 6 % (ref 3–12)
NEUTROS ABS: 7.4 10*3/uL (ref 1.7–7.7)
Neutrophils Relative %: 73 % (ref 43–77)
Platelets: 413 10*3/uL — ABNORMAL HIGH (ref 150–400)
RBC: 4.44 MIL/uL (ref 3.87–5.11)
RDW: 14.7 % (ref 11.5–15.5)
WBC: 10.2 10*3/uL (ref 4.0–10.5)

## 2013-02-22 LAB — HEPATIC FUNCTION PANEL
ALBUMIN: 4 g/dL (ref 3.5–5.2)
ALT: 13 U/L (ref 0–35)
AST: 12 U/L (ref 0–37)
Alkaline Phosphatase: 72 U/L (ref 39–117)
Bilirubin, Direct: <0.1 mg/dL (ref 0.0–0.3)
TOTAL PROTEIN: 7.1 g/dL (ref 6.0–8.3)
Total Bilirubin: 0.3 mg/dL (ref 0.2–1.2)

## 2013-02-22 LAB — MAGNESIUM: MAGNESIUM: 1.9 mg/dL (ref 1.5–2.5)

## 2013-02-22 LAB — LIPID PANEL
CHOLESTEROL: 190 mg/dL (ref 0–200)
HDL: 44 mg/dL (ref >39)
LDL Cholesterol: 104 mg/dL — ABNORMAL HIGH (ref 0–99)
TRIGLYCERIDES: 208 mg/dL — AB (ref <150)
Total CHOL/HDL Ratio: 4.3 Ratio
VLDL: 42 mg/dL — ABNORMAL HIGH (ref 0–40)

## 2013-02-22 LAB — BASIC METABOLIC PANEL WITH GFR
BUN: 17 mg/dL (ref 6–23)
CALCIUM: 9.3 mg/dL (ref 8.4–10.5)
CO2: 26 mEq/L (ref 19–32)
CREATININE: 0.61 mg/dL (ref 0.50–1.10)
Chloride: 103 mEq/L (ref 96–112)
Glucose, Bld: 136 mg/dL — ABNORMAL HIGH (ref 70–99)
POTASSIUM: 4.4 meq/L (ref 3.5–5.3)
Sodium: 138 mEq/L (ref 135–145)

## 2013-02-22 LAB — VITAMIN D 25 HYDROXY (VIT D DEFICIENCY, FRACTURES): Vit D, 25-Hydroxy: 59 ng/mL (ref 30–89)

## 2013-02-22 LAB — INSULIN, FASTING: INSULIN FASTING, SERUM: 124 u[IU]/mL — AB (ref 3–28)

## 2013-02-22 LAB — TSH: TSH: 2.397 u[IU]/mL (ref 0.350–4.500)

## 2013-02-26 ENCOUNTER — Encounter: Payer: Self-pay | Admitting: Physician Assistant

## 2013-02-26 DIAGNOSIS — G4730 Sleep apnea, unspecified: Secondary | ICD-10-CM

## 2013-03-11 ENCOUNTER — Other Ambulatory Visit: Payer: Self-pay | Admitting: Physician Assistant

## 2013-03-14 NOTE — Telephone Encounter (Signed)
Patients spouse, Gerlene BurdockRichard called and said that they are concerned that her increased pain in lower back and legs, and inability to get out of bed on her own associated with Celebrex.  Marchelle Folksmanda reccommended that the patient go to the ER for further evaluation.  Thanks Lady Garykatrina 03-14-2013 12:01pm

## 2013-04-21 ENCOUNTER — Other Ambulatory Visit: Payer: Self-pay | Admitting: Physician Assistant

## 2013-05-16 ENCOUNTER — Encounter: Payer: Self-pay | Admitting: Internal Medicine

## 2013-05-16 ENCOUNTER — Ambulatory Visit: Payer: Self-pay | Admitting: Physician Assistant

## 2013-05-16 ENCOUNTER — Ambulatory Visit: Payer: Self-pay | Admitting: Emergency Medicine

## 2013-05-16 ENCOUNTER — Ambulatory Visit (INDEPENDENT_AMBULATORY_CARE_PROVIDER_SITE_OTHER): Payer: BC Managed Care – PPO | Admitting: Internal Medicine

## 2013-05-16 VITALS — BP 134/90 | HR 72 | Temp 98.4°F | Resp 16 | Ht 63.25 in | Wt 300.2 lb

## 2013-05-16 DIAGNOSIS — K2100 Gastro-esophageal reflux disease with esophagitis, without bleeding: Secondary | ICD-10-CM | POA: Insufficient documentation

## 2013-05-16 DIAGNOSIS — Z79899 Other long term (current) drug therapy: Secondary | ICD-10-CM

## 2013-05-16 DIAGNOSIS — R7303 Prediabetes: Secondary | ICD-10-CM

## 2013-05-16 DIAGNOSIS — E8881 Metabolic syndrome: Secondary | ICD-10-CM

## 2013-05-16 DIAGNOSIS — E559 Vitamin D deficiency, unspecified: Secondary | ICD-10-CM

## 2013-05-16 DIAGNOSIS — E782 Mixed hyperlipidemia: Secondary | ICD-10-CM | POA: Insufficient documentation

## 2013-05-16 DIAGNOSIS — R7309 Other abnormal glucose: Secondary | ICD-10-CM | POA: Insufficient documentation

## 2013-05-16 DIAGNOSIS — I10 Essential (primary) hypertension: Secondary | ICD-10-CM

## 2013-05-16 MED ORDER — METFORMIN HCL ER 500 MG PO TB24
ORAL_TABLET | ORAL | Status: DC
Start: 2013-05-16 — End: 2014-02-25

## 2013-05-16 NOTE — Progress Notes (Signed)
Patient ID: Emily Gay, female   DOB: 05/02/1970, 43 y.o.   MRN: [id number]    This very nice 43 y.o. female presents for 3 month follow up with Hypertension, Hyperlipidemia, Pre-Diabetes and Vitamin D Deficiency.    HTN predates since July 2014. BP has been controlled at home. Today's BP: 134/90 mmHg . Patient denies any cardiac type chest pain, palpitations, dyspnea/orthopnea/PND, dizziness, claudication, or dependent edema.   Hyperlipidemia is controlled with diet. Last Cholesterol was 190, Triglycerides were 208, HDL 44 and LDL 104 in Jan 2015 . Patient denies myalgias or other med SE's.    Also, the patient has history of Morbid Obesity (BMI 52.73) attributing to PreDiabetes/insulin resistance had last A1c of 5.6% and elevated insulin of 124. She was advised but never started Metformin for her Obesity and Insulin Resistance. Patient denies any symptoms of reactive hypoglycemia, diabetic polys, paresthesias or visual blurring.   Further, Patient has history of Vitamin D Deficiency of 34 in Aug 2014 and last vitamin D was 859 in Jan 2015. Patient supplements vitamin D without any suspected side-effects.  Medication Sig  . acetaminophen (TYLENOL) 500 MG  Take 500 mg by mouth every 6 (six) hours as needed.  . celecoxib (CELEBREX) 200 MG  TAKE 1 CAPSULE (200 MG TOTAL) BY MOUTH 2 (TWO) TIMES DAILY.  . fluticasone (FLONASE) nasal spray Place 2 sprays into both nostrils at bedtime.  Marland Kitchen. levocetirizine (XYZAL) 5 MG tablet TAKE 1 TABLET BY MOUTH DAILY  . Methocarbamol (ROBAXIN PO) Take by mouth 2 (two) times daily.  . MULTIVITAMIN Take 1 capsule by mouth daily.  . pantoprazole (PROTONIX) 40 MG  Take 1 tablet (40 mg total) by mouth daily.  . pregabalin (LYRICA) 75 MG capsule 1 pill 3 times a day and then 2 pills at night for pain  . TRAMADOL HCL PO Take 50 mg by mouth. Take 1/2 to 1 tablet every 4-6 hours as needed   No Known Allergies  PMHx:   Past Medical History  Diagnosis Date  . Allergic  rhinitis   . Hypertension   . Obesity   . Cholelithiasis   . Prediabetes    FHx:    Reviewed / unchanged  SHx:    Reviewed / unchanged   Systems Review: Constitutional: Denies fever, chills, wt changes, headaches, insomnia, fatigue, night sweats, change in appetite. Eyes: Denies redness, blurred vision, diplopia, discharge, itchy, watery eyes.  ENT: Denies discharge, congestion, post nasal drip, epistaxis, sore throat, earache, hearing loss, dental pain, tinnitus, vertigo, sinus pain, snoring.  CV: Denies chest pain, palpitations, irregular heartbeat, syncope, dyspnea, diaphoresis, orthopnea, PND, claudication, edema. Respiratory: denies cough, dyspnea, DOE, pleurisy, hoarseness, laryngitis, wheezing.  Gastrointestinal: Denies dysphagia, odynophagia, heartburn, reflux, water brash, abdominal pain or cramps, nausea, vomiting, bloating, diarrhea, constipation, hematemesis, melena, hematochezia,  or hemorrhoids. Genitourinary: Denies dysuria, frequency, urgency, nocturia, hesitancy, discharge, hematuria, flank pain. Musculoskeletal: Denies arthralgias, myalgias, stiffness, jt. swelling, pain, limp, strain/sprain.  Skin: Denies pruritus, rash, hives, warts, acne, eczema, change in skin lesion(s). Neuro: No weakness, tremor, incoordination, spasms, paresthesia, or pain. Psychiatric: Denies confusion, memory loss, or sensory loss. Endo: Denies change in weight, skin, hair change.  Heme/Lymph: No excessive bleeding, bruising, orenlarged lymph nodes.   Exam:  BP 134/90  Pulse 72  Temp 98.4 F   Resp 16  Ht 5' 3.25"  Wt 300 lb 3.2 oz   BMI 52.73 kg/m2  Appears well nourished - in no distress. Eyes: PERRLA, EOMs, conjunctiva no swelling or erythema. Sinuses:  No frontal/maxillary tenderness ENT/Mouth: EAC's clear, TM's nl w/o erythema, bulging. Nares clear w/o erythema, swelling, exudates. Oropharynx clear without erythema or exudates. Oral hygiene is good. Tongue normal, non obstructing.  Hearing intact.  Neck: Supple. Thyroid nl. Car 2+/2+ without bruits, nodes or JVD. Chest: Respirations nl with BS clear & equal w/o rales, rhonchi, wheezing or stridor.  Cor: Heart sounds normal w/ regular rate and rhythm without sig. murmurs, gallops, clicks, or rubs. Peripheral pulses normal and equal  without edema.  Abdomen: Soft & bowel sounds normal. Non-tender w/o guarding, rebound, hernias, masses, or organomegaly.  Lymphatics: Unremarkable.  Musculoskeletal: Full ROM all peripheral extremities, joint stability, 5/5 strength, and normal gait.  Skin: Warm, dry without exposed rashes, lesions, ecchymosis apparent.  Neuro: Cranial nerves intact, reflexes equal bilaterally. Sensory-motor testing grossly intact. Tendon reflexes grossly intact.  Pysch: Alert & oriented x 3. Insight and judgement nl & appropriate. No ideations.  Assessment and Plan:  1. Hypertension - Continue monitor blood pressure at home. Continue diet/meds same.  2. Hyperlipidemia - Continue diet, exercise,& lifestyle modifications. Continue monitor periodic cholesterol/liver & renal functions   3. Pre-diabetes/Insulin Resistance - Continue diet, weight loss, exercise, lifestyle modifications. Monitor appropriate labs. Encouraged to start Metformin.  4. Vitamin D Deficiency - Continue supplementation.  5. Morbid Obesity (BMI 52.73)  Recommended regular exercise, BP monitoring, weight control, and discussed med and SE's. Recommended labs to assess and monitor clinical status. Further disposition pending results of labs.

## 2013-05-16 NOTE — Patient Instructions (Signed)

## 2013-05-17 LAB — LIPID PANEL
CHOL/HDL RATIO: 4.3 ratio
Cholesterol: 179 mg/dL (ref 0–200)
HDL: 42 mg/dL (ref >39)
LDL CALC: 112 mg/dL — AB (ref 0–99)
TRIGLYCERIDES: 127 mg/dL (ref <150)
VLDL: 25 mg/dL (ref 0–40)

## 2013-05-17 LAB — HEMOGLOBIN A1C
Hgb A1c MFr Bld: 6 % — ABNORMAL HIGH (ref <5.7)
Mean Plasma Glucose: 126 mg/dL — ABNORMAL HIGH (ref <117)

## 2013-05-17 LAB — VITAMIN D 25 HYDROXY (VIT D DEFICIENCY, FRACTURES): Vit D, 25-Hydroxy: 60 ng/mL (ref 30–89)

## 2013-05-17 LAB — INSULIN, FASTING: Insulin fasting, serum: 13 u[IU]/mL (ref 3–28)

## 2013-05-17 LAB — TSH: TSH: 1.79 u[IU]/mL (ref 0.350–4.500)

## 2013-05-17 LAB — MAGNESIUM: Magnesium: 1.8 mg/dL (ref 1.5–2.5)

## 2013-07-21 ENCOUNTER — Other Ambulatory Visit: Payer: Self-pay | Admitting: Physician Assistant

## 2013-07-21 ENCOUNTER — Other Ambulatory Visit: Payer: Self-pay | Admitting: Emergency Medicine

## 2013-08-08 ENCOUNTER — Telehealth: Payer: Self-pay | Admitting: *Deleted

## 2013-08-08 ENCOUNTER — Other Ambulatory Visit: Payer: Self-pay | Admitting: Physician Assistant

## 2013-08-08 NOTE — Telephone Encounter (Signed)
Patient called and states she has headache on left side of head x 5 days-a dull ache.  Patient states she takes Tylenol at work and Tramadol when she gets home with some relief.  Patient will go to ER if pain increases or will call for visit here next week.

## 2013-08-11 ENCOUNTER — Encounter: Payer: Self-pay | Admitting: Physician Assistant

## 2013-08-11 ENCOUNTER — Ambulatory Visit (INDEPENDENT_AMBULATORY_CARE_PROVIDER_SITE_OTHER): Payer: BC Managed Care – PPO | Admitting: Physician Assistant

## 2013-08-11 VITALS — BP 138/88 | HR 80 | Temp 97.7°F | Resp 16 | Wt 292.0 lb

## 2013-08-11 DIAGNOSIS — R519 Headache, unspecified: Secondary | ICD-10-CM

## 2013-08-11 DIAGNOSIS — M26609 Unspecified temporomandibular joint disorder, unspecified side: Secondary | ICD-10-CM

## 2013-08-11 DIAGNOSIS — R51 Headache: Secondary | ICD-10-CM

## 2013-08-11 LAB — CBC WITH DIFFERENTIAL/PLATELET
BASOS PCT: 0 % (ref 0–1)
Basophils Absolute: 0 10*3/uL (ref 0.0–0.1)
Eosinophils Absolute: 0.2 10*3/uL (ref 0.0–0.7)
Eosinophils Relative: 1 % (ref 0–5)
HCT: 40.4 % (ref 36.0–46.0)
Hemoglobin: 13.4 g/dL (ref 12.0–15.0)
LYMPHS ABS: 4.1 10*3/uL — AB (ref 0.7–4.0)
Lymphocytes Relative: 25 % (ref 12–46)
MCH: 26.9 pg (ref 26.0–34.0)
MCHC: 33.2 g/dL (ref 30.0–36.0)
MCV: 81.1 fL (ref 78.0–100.0)
Monocytes Absolute: 1.5 10*3/uL — ABNORMAL HIGH (ref 0.1–1.0)
Monocytes Relative: 9 % (ref 3–12)
Neutro Abs: 10.7 10*3/uL — ABNORMAL HIGH (ref 1.7–7.7)
Neutrophils Relative %: 65 % (ref 43–77)
Platelets: 443 10*3/uL — ABNORMAL HIGH (ref 150–400)
RBC: 4.98 MIL/uL (ref 3.87–5.11)
RDW: 14.4 % (ref 11.5–15.5)
WBC: 16.5 10*3/uL — ABNORMAL HIGH (ref 4.0–10.5)

## 2013-08-11 MED ORDER — AZITHROMYCIN 250 MG PO TABS: ORAL_TABLET | ORAL | Status: AC

## 2013-08-11 NOTE — Patient Instructions (Signed)
Start on flonase daily for 1-2 weeks Do zpak as directed  Start on the Robaxin nightly for 5 nights Can ask about a new mask Massage/heat left jaw, soft foods Follow up Dr. Anne HahnWillis  If any worsening vision, change speech, worsening headache, nausea, vomiting, go to ER  What is the TMJ? The temporomandibular (tem-PUH-ro-man-DIB-yoo-ler) joint, or the TMJ, connects the upper and lower jawbones. This joint allows the jaw to open wide and move back and forth when you chew, talk, or yawn.There are also several muscles that help this joint move. There can be muscle tightness and pain in the muscle that can cause several symptoms.  What causes TMJ pain? There are many causes of TMJ pain. Repeated chewing (for example, chewing gum) and clenching your teeth can cause pain in the joint. Some TMJ pain has no obvious cause. What can I do to ease the pain? There are many things you can do to help your pain get better. When you have pain:  Eat soft foods and stay away from chewy foods (for example, taffy) Try to use both sides of your mouth to chew Don't chew gum Don't open your mouth wide (for example, during yawning or singing) Don't bite your cheeks or fingernails Lower your amount of stress and worry Applying a warm, damp washcloth to the joint may help. Over-the-counter pain medicines such as ibuprofen (one brand: Advil) or acetaminophen (one brand: Tylenol) might also help. Do not use these medicines if you are allergic to them or if your doctor told you not to use them. How can I stop the pain from coming back? When your pain is better, you can do these exercises to make your muscles stronger and to keep the pain from coming back:  Resisted mouth opening: Place your thumb or two fingers under your chin and open your mouth slowly, pushing up lightly on your chin with your thumb. Hold for three to six seconds. Close your mouth slowly. Resisted mouth closing: Place your thumbs under your chin and  your two index fingers on the ridge between your mouth and the bottom of your chin. Push down lightly on your chin as you close your mouth. Tongue up: Slowly open and close your mouth while keeping the tongue touching the roof of the mouth. Side-to-side jaw movement: Place an object about one fourth of an inch thick (for example, two tongue depressors) between your front teeth. Slowly move your jaw from side to side. Increase the thickness of the object as the exercise becomes easier Forward jaw movement: Place an object about one fourth of an inch thick between your front teeth and move the bottom jaw forward so that the bottom teeth are in front of the top teeth. Increase the thickness of the object as the exercise becomes easier. These exercises should not be painful. If it hurts to do these exercises, stop doing them and talk to your family doctor.

## 2013-08-11 NOTE — Progress Notes (Signed)
Assessment and Plan:  Hypertension: Continue medication, monitor blood pressure at home. Continue DASH diet. Cholesterol: Continue diet and exercise. Check cholesterol.  Pre-diabetes-Continue diet and exercise. Check A1C Vitamin D Def- check level and continue medications.  Left temporal pain- normal neuro exam- no abscess tooth, + temporal pain with some abnormal vision, check ESR, follow up Dr. Jannifer Franklin, will treat sinuses and will treat TMJ  Continue diet and meds as discussed. Further disposition pending results of labs.  HPI 43 y.o. female  presents for 3 month follow up with hypertension, hyperlipidemia, prediabetes and vitamin D. Her blood pressure has been controlled at home, today their BP is BP: 138/88 mmHg She does not workout. She denies chest pain, shortness of breath, dizziness.  She is not on cholesterol medication and denies myalgias. Her cholesterol is at goal. The cholesterol last visit was:   Lab Results  Component Value Date   CHOL 179 05/16/2013   HDL 42 05/16/2013   LDLCALC 112* 05/16/2013   LDLDIRECT 134.4 07/26/2011   TRIG 127 05/16/2013   CHOLHDL 4.3 05/16/2013   She has been working on diet and exercise for prediabetes, and denies polydipsia, polyuria and visual disturbances. Last A1C in the office was:  Lab Results  Component Value Date   HGBA1C 6.0* 05/16/2013   Patient is on Vitamin D supplement.   Lab Results  Component Value Date   VD25OH 60 05/16/2013     Started CPAP about a month ago and started to have left sided temple pain, thought it was from the full face mask. Stopped it for a few days and got better until 1 week ago. Left sided temple/parital pain know behind her left ear and left eye with some left eye blurring and dizziness, feels pressure/swelling, tender to touch, with it worsening throughout the day. Husband had a sinus infection over 4th of July weekend. Has an appointment with Dr. Jannifer Franklin on Wednesday.   Wt Readings from Last 3 Encounters:   08/11/13 292 lb (132.45 kg)  05/16/13 300 lb 3.2 oz (136.17 kg)  02/21/13 296 lb (134.265 kg)    Current Medications:  Current Outpatient Prescriptions on File Prior to Visit  Medication Sig Dispense Refill  . acetaminophen (TYLENOL) 500 MG tablet Take 500 mg by mouth every 6 (six) hours as needed.      . celecoxib (CELEBREX) 200 MG capsule TAKE 1 CAPSULE (200 MG TOTAL) BY MOUTH 2 (TWO) TIMES DAILY.   -- need office visit before end of may for refills  60 capsule  0  . Cholecalciferol (VITAMIN D PO) Take 5,000 Units by mouth 2 (two) times daily.      . fluticasone (FLONASE) 50 MCG/ACT nasal spray Place 2 sprays into both nostrils at bedtime.      Marland Kitchen levocetirizine (XYZAL) 5 MG tablet TAKE 1 TABLET BY MOUTH DAILY  30 tablet  4  . LYRICA 75 MG capsule TAKE 1 CAPSULE BY MOUTH 3 TIMES DAILY AND 2 CAPSULES AT NIGHT  150 capsule  3  . metFORMIN (GLUCOPHAGE XR) 500 MG 24 hr tablet Take 4 tablets daily as directed for insulin resistance  120 tablet  99  . Methocarbamol (ROBAXIN PO) Take by mouth 2 (two) times daily.      . Multiple Vitamin (MULTIVITAMIN) capsule Take 1 capsule by mouth daily.      . Olopatadine HCl (PATADAY OP) Apply to eye daily.      . pantoprazole (PROTONIX) 40 MG tablet Take 1 tablet (40 mg total) by  mouth daily.  90 tablet  2  . TRAMADOL HCL PO Take 50 mg by mouth. Take 1/2 to 1 tablet every 4-6 hours as needed       No current facility-administered medications on file prior to visit.   Medical History:  Past Medical History  Diagnosis Date  . Allergic rhinitis   . Hypertension   . Obesity   . Cholelithiasis   . Prediabetes    Allergies: No Known Allergies   Review of Systems: [X] = complains of  [ ] = denies  General: Fatigue [ ] Fever [ ] Chills [ ] Weakness [ ]  Insomnia [ ] Eyes: Redness [ ] Blurred vision [ ] Diplopia [ ]  ENT: Congestion [ ] Sinus Pain [ ] Post Nasal Drip [ ] Sore Throat [ ] Earache [ ]  Cardiac: Chest pain/pressure [ ] SOB [ ] Orthopnea [  ]  Palpitations [ ]  Paroxysmal nocturnal dyspnea[ ] Claudication [ ] Edema [ ]  Pulmonary: Cough [ ] Wheezing[ ]  SOB [ ]  Snoring [ ]  GI: Nausea [ ] Vomiting[ ] Dysphagia[ ] Heartburn[ ] Abdominal pain [ ] Constipation [ ]; Diarrhea [ ]; BRBPR [ ] Melena[ ] GU: Hematuria[ ] Dysuria [ ] Nocturia[ ] Urgency [ ]  Hesitancy [ ] Discharge [ ] Neuro: Headaches[x ] Vertigo[ ] Paresthesias[ ] Spasm [ ] Speech changes [ ] Incoordination [ ]  Ortho: Arthritis [ ] Joint pain [ ] Muscle pain [ ] Joint swelling [ ] Back Pain [ ] Skin:  Rash [ ]  Pruritis [ ] Change in skin lesion [ ]  Psych: Depression[ ] Anxiety[ ] Confusion [ ] Memory loss [ ]  Heme/Lypmh: Bleeding [ ] Bruising [ ] Enlarged lymph nodes [ ]  Endocrine: Visual blurring [ x] Paresthesia [ ] Polyuria [ ] Polydypsea [ ]   Heat/cold intolerance [ ] Hypoglycemia [ ]  Family history- Review and unchanged Social history- Review and unchanged Physical Exam: BP 138/88  Pulse 80  Temp(Src) 97.7 F (36.5 C)  Resp 16  Wt 292 lb (132.45 kg) Wt Readings from Last 3 Encounters:  08/11/13 292 lb (132.45 kg)  05/16/13 300 lb 3.2 oz (136.17 kg)  02/21/13 296 lb (134.265 kg)   General Appearance: Well nourished, in no apparent distress. Eyes: PERRLA, EOMs, conjunctiva no swelling or erythema Sinuses: + maxillary tenderness ENT/Mouth: Ext aud canals clear, TMs without erythema, bulging. No erythema, swelling, or exudate on post pharynx.  Tonsils not swollen or erythematous. Hearing normal. + TMJ tenderness, good dentition, + left temporal tenderness Neck: Supple, thyroid normal.  Respiratory: Respiratory effort normal, BS equal bilaterally without rales, rhonchi, wheezing or stridor.  Cardio: RRR with no MRGs. Brisk peripheral pulses without edema.  Abdomen: Soft, + BS.  Non tender, no guarding, rebound, hernias, masses. Lymphatics: Non tender without lymphadenopathy.  Musculoskeletal: Full ROM, 5/5 strength, normal gait.  Skin: Warm, dry  without rashes, lesions, ecchymosis.  Neuro: Cranial nerves intact. Normal muscle tone, no cerebellar symptoms. Sensation intact.  Psych: Awake and oriented X 3, normal affect, Insight and Judgment appropriate.    Vicie Mutters 3:45 PM

## 2013-08-12 LAB — COMPREHENSIVE METABOLIC PANEL
ALK PHOS: 72 U/L (ref 39–117)
ALT: 11 U/L (ref 0–35)
AST: 12 U/L (ref 0–37)
Albumin: 4.7 g/dL (ref 3.5–5.2)
BUN: 18 mg/dL (ref 6–23)
CHLORIDE: 97 meq/L (ref 96–112)
CO2: 29 mEq/L (ref 19–32)
CREATININE: 0.61 mg/dL (ref 0.50–1.10)
Calcium: 9.9 mg/dL (ref 8.4–10.5)
Glucose, Bld: 68 mg/dL — ABNORMAL LOW (ref 70–99)
Potassium: 4.7 mEq/L (ref 3.5–5.3)
Sodium: 138 mEq/L (ref 135–145)
Total Bilirubin: 0.2 mg/dL (ref 0.2–1.2)
Total Protein: 8 g/dL (ref 6.0–8.3)

## 2013-08-12 LAB — SEDIMENTATION RATE: SED RATE: 28 mm/h — AB (ref 0–22)

## 2013-08-13 ENCOUNTER — Encounter: Payer: Self-pay | Admitting: Neurology

## 2013-08-13 ENCOUNTER — Ambulatory Visit (INDEPENDENT_AMBULATORY_CARE_PROVIDER_SITE_OTHER): Payer: BC Managed Care – PPO | Admitting: Neurology

## 2013-08-13 VITALS — BP 118/76 | HR 80 | Wt 293.0 lb

## 2013-08-13 DIAGNOSIS — R51 Headache: Secondary | ICD-10-CM

## 2013-08-13 DIAGNOSIS — R519 Headache, unspecified: Secondary | ICD-10-CM | POA: Insufficient documentation

## 2013-08-13 DIAGNOSIS — R259 Unspecified abnormal involuntary movements: Secondary | ICD-10-CM

## 2013-08-13 NOTE — Progress Notes (Signed)
Reason for visit: Headache  Emily Gay is an 43 y.o. female  History of present illness:  Emily Gay is a 43 year old right-handed white female with a history of morbid obesity. She's been seen through this practice previously 2 years ago for reports of episodic confusion and fasciculations in the muscle. She indicates that these issues have improved with the use of Lyrica. The patient has recently had some difficulty with low back pain, and she is being followed through an orthopedic physician for this issue. 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 She indicates that within the last week prior to this evaluation, she developed some discomfort with a pressure sensation on the left side the head going into and around the left ear. The pressure sensation is better in the morning, worse as the day goes on. She is on prednisone for her low back, but she indicates that the headache created be onset of prednisone use. She is on CPAP for obstructive sleep apnea. She does have some issues with fatigue. She currently is on antibiotics. The headache may be associated with some blurring of vision, no nausea or vomiting. She reports no new numbness or weakness on the arms or legs, but she does have some chronic neck stiffness. She comes to this office for an evaluation.  Past Medical History  Diagnosis Date  . Allergic rhinitis   . Hypertension   . Obesity   . Cholelithiasis   . Prediabetes   . Chronic low back pain   . OSA on CPAP     Past Surgical History  Procedure Laterality Date  . Cholecystectomy      Family History  Problem Relation Age of Onset  . Cancer Mother   . Lung disease Mother   . Arthritis Father   . Hypertension Father   . Stroke Father   . Melanoma Sister   . Fibromyalgia Sister   . Mental illness Other     emotional illness (other blood relative)  .  Stroke Other     Stroke (parent and grandparent)  . Heart disease Other     (grandparent)  . Hypertension Other     (other blood relative)  . Arthritis Other     Parent and grandparent    Social history:  reports that she has never smoked. She does not have any smokeless tobacco history on file. She reports that she drinks alcohol. Her drug history is not on file.   No Known Allergies  Medications:  Current Outpatient Prescriptions on File Prior to Visit  Medication Sig Dispense Refill  . acetaminophen (TYLENOL) 500 MG tablet Take 500 mg by mouth every 6 (six) hours as needed.      Marland Kitchen azithromycin (ZITHROMAX) 250 MG tablet Take 2 tablets (500 mg) on  Day 1,  followed by 1 tablet (250 mg) once daily on Days 2 through 5.  6 each  1  . celecoxib (CELEBREX) 200 MG capsule TAKE 1 CAPSULE (200 MG TOTAL) BY MOUTH 2 (TWO) TIMES DAILY.   -- need office visit before end of may for refills  60 capsule  0  . Cholecalciferol (VITAMIN D PO) Take 5,000 Units by mouth 2 (two) times daily.      . fluticasone (FLONASE) 50 MCG/ACT nasal spray Place 2 sprays into both nostrils at bedtime.      Marland Kitchen levocetirizine (XYZAL) 5  MG tablet TAKE 1 TABLET BY MOUTH DAILY  30 tablet  4  . LYRICA 75 MG capsule TAKE 1 CAPSULE BY MOUTH 3 TIMES DAILY AND 2 CAPSULES AT NIGHT  150 capsule  3  . metFORMIN (GLUCOPHAGE XR) 500 MG 24 hr tablet Take 4 tablets daily as directed for insulin resistance  120 tablet  99  . Methocarbamol (ROBAXIN PO) Take by mouth 2 (two) times daily.      . Multiple Vitamin (MULTIVITAMIN) capsule Take 1 capsule by mouth daily.      . Olopatadine HCl (PATADAY OP) Apply to eye daily.      . pantoprazole (PROTONIX) 40 MG tablet Take 1 tablet (40 mg total) by mouth daily.  90 tablet  2  . TRAMADOL HCL PO Take 50 mg by mouth. Take 1/2 to 1 tablet every 4-6 hours as needed       No current facility-administered medications on file prior to visit.    ROS:  Out of a complete 14 system review of symptoms,  the patient complains only of the following symptoms, and all other reviewed systems are negative.  Fatigue, fever, sweating Eye pain Light sensitivity, blurred vision Heat intolerance, flushing Sleep apnea Environmental allergies, frequent infections Joint pain, joint swelling, back pain, muscle cramps, walking difficulties, neck stiffness Itching Memory loss, dizziness, headache, numbness, seizures, speech difficulties, weakness, tremors Decreased concentration  Blood pressure 118/76, pulse 80, weight 293 lb (132.904 kg).  Physical Exam  General: The patient is alert and cooperative at the time of the examination. The patient is morbidly obese.  Ears: Tympanic membranes are clear bilaterally.  Neuromuscular: Range of movement of the cervical spine is full. No crepitus is noted in the temporomandibular joints.  Skin: No significant peripheral edema is noted.   Neurologic Exam  Mental status: The patient is oriented x 3.  Cranial nerves: Facial symmetry is present. Speech is normal, no aphasia or dysarthria is noted. Extraocular movements are full. Visual fields are full. Pupils are equal, round, and reactive to light. Discs are flat bilaterally.  Motor: The patient has good strength in all 4 extremities.  Sensory examination: Soft touch sensation is symmetric on the face, arms, and legs.  Coordination: The patient has good finger-nose-finger and heel-to-shin bilaterally.  Gait and station: The patient has a normal gait. Tandem gait is slightly unsteady. Romberg is negative. No drift is seen.  Reflexes: Deep tendon reflexes are symmetric, but are depressed.   Assessment/Plan:  1. Morbid obesity  2. Sleep apnea on CPAP  3. Low back pain  4. History of fasciculations, confusion  5. Headache, left temporal  The patient has onset of what appears to be a neuromuscular type headache with a pressure sensation, better in the morning and worse as the day goes on. The  patient does not appear to have temporomandibular joint issues. The patient will go up on the Robaxin taking 500 mg twice or 3 times daily. If this is not effective after one or 2 weeks, she is to contact our office, and we will consider MRI evaluation of the brain. The patient otherwise will followup through this office if needed.  Marlan Palau. Keith [name] MD 08/13/2013 7:56 PM  [facility] [address] [place], [place] [id number]  Phone [phone] Fax [phone]

## 2013-08-13 NOTE — Patient Instructions (Signed)

## 2013-08-19 ENCOUNTER — Other Ambulatory Visit: Payer: Self-pay | Admitting: Physician Assistant

## 2013-09-22 ENCOUNTER — Telehealth: Payer: Self-pay | Admitting: Neurology

## 2013-09-22 NOTE — Telephone Encounter (Signed)
Patient's has had a couple really strong/medium seizures (Fri, Sat and Sunday) and would like a call back regarding this. [phone] is the best number to call anytime and okay to leave a message

## 2013-09-23 ENCOUNTER — Telehealth: Payer: Self-pay | Admitting: *Deleted

## 2013-09-23 DIAGNOSIS — R51 Headache: Secondary | ICD-10-CM

## 2013-09-23 DIAGNOSIS — G253 Myoclonus: Secondary | ICD-10-CM

## 2013-09-23 NOTE — Telephone Encounter (Signed)
Left message for patient to call office back for a work-in appointment if seizures are new diagnosis. She has not been seen for seizures.

## 2013-09-23 NOTE — Telephone Encounter (Signed)
I called the patient, talked with the husband. The patient is having what sounds like jerking and myoclonic type movements in the evening when she is tired. This has not happened while she is at work. We will check MRI evaluation the brain, and get an EEG study. The patient is on Lyrica, and this could potentially be the source of the myoclonus.

## 2013-09-23 NOTE — Telephone Encounter (Signed)
This patient seems to be evolving new symptoms. I will likely need to see her back after the MRI and EEG study.

## 2013-09-23 NOTE — Telephone Encounter (Signed)
Patient's has had a couple really strong/medium seizures (Fri, Sat and Sunday) and would like a call back regarding this. [phone] is the best number to call anytime and okay to leave a message     I have been trying to contact this patient all day. I did not see seizure as a diagnosis on this patient.

## 2013-09-23 NOTE — Telephone Encounter (Signed)
The patient has onset of what appears to be a neuromuscular type headache with a pressure sensation, better in the morning and worse as the day goes on. The patient does not appear to have temporomandibular joint issues. The patient will go up on the Robaxin taking 500 mg twice or 3 times daily. If this is not effective after one or 2 weeks, she is to contact our office, and we will consider MRI evaluation of the brain. The patient otherwise will followup through this office if needed.    Patient not having seizure, she is having involuntary movements. See above consideration of MRI. Please call back cell number.

## 2013-09-24 ENCOUNTER — Other Ambulatory Visit: Payer: Self-pay | Admitting: Physician Assistant

## 2013-09-24 MED ORDER — METHOCARBAMOL 500 MG PO TABS: 500.0000 mg | ORAL_TABLET | Freq: Three times a day (TID) | ORAL | Status: DC | PRN

## 2013-10-02 ENCOUNTER — Encounter: Payer: Self-pay | Admitting: Physician Assistant

## 2013-10-03 ENCOUNTER — Ambulatory Visit: Payer: Self-pay | Admitting: Internal Medicine

## 2013-10-05 ENCOUNTER — Ambulatory Visit
Admission: RE | Admit: 2013-10-05 | Discharge: 2013-10-05 | Disposition: A | Discharge status: Home / Self Care | Payer: BC Managed Care – PPO | Source: Ambulatory Visit | Attending: Neurology | Admitting: Neurology

## 2013-10-05 DIAGNOSIS — G253 Myoclonus: Secondary | ICD-10-CM

## 2013-10-05 DIAGNOSIS — R51 Headache: Secondary | ICD-10-CM

## 2013-10-06 ENCOUNTER — Telehealth: Payer: Self-pay | Admitting: Neurology

## 2013-10-06 NOTE — Telephone Encounter (Signed)
I called and left a message for the patient to callback to schedule her EEG study.

## 2013-10-06 NOTE — Telephone Encounter (Signed)
  I called patient. MRI the brain is unremarkable, but there does appear to be evidence of significant sinus disease, worsened since 2013. The patient does have a pressure sensation, could potentially be related to sinus disease. We may consider a referral to ear nose and throat.  MRI brain 10/06/2013:  Impression   Unremarkable MRI scan of the brain without contrast. Chronic  severe paranasal sinusitis changes are noted incidentally which appear  more advanced compared with MRI scan dated 09/03/2011

## 2013-10-07 ENCOUNTER — Telehealth: Payer: Self-pay | Admitting: Neurology

## 2013-10-07 ENCOUNTER — Encounter: Payer: Self-pay | Admitting: Neurology

## 2013-10-07 ENCOUNTER — Ambulatory Visit (INDEPENDENT_AMBULATORY_CARE_PROVIDER_SITE_OTHER): Payer: BC Managed Care – PPO | Admitting: Neurology

## 2013-10-07 ENCOUNTER — Other Ambulatory Visit (INDEPENDENT_AMBULATORY_CARE_PROVIDER_SITE_OTHER): Payer: Self-pay

## 2013-10-07 VITALS — BP 138/80 | HR 93 | Wt 285.0 lb

## 2013-10-07 DIAGNOSIS — G253 Myoclonus: Secondary | ICD-10-CM

## 2013-10-07 DIAGNOSIS — G4769 Other sleep related movement disorders: Secondary | ICD-10-CM

## 2013-10-07 DIAGNOSIS — R51 Headache: Secondary | ICD-10-CM

## 2013-10-07 DIAGNOSIS — Z0289 Encounter for other administrative examinations: Secondary | ICD-10-CM

## 2013-10-07 DIAGNOSIS — R404 Transient alteration of awareness: Secondary | ICD-10-CM | POA: Insufficient documentation

## 2013-10-07 HISTORY — DX: Other sleep related movement disorders: G47.69

## 2013-10-07 NOTE — Telephone Encounter (Signed)
SEE PHONE NOTE 10/07/13.

## 2013-10-07 NOTE — Patient Instructions (Signed)
Myoclonus Myoclonus is a term that refers to brief, involuntary twitching or jerking of a muscle or a group of muscles. It describes a symptom, and generally, is not a diagnosis of a disease. The myoclonic twitches or jerks are usually caused by sudden muscle contractions. They also can result from brief lapses of contraction. Myoclonic twitches or jerks may occur:  Alone or in sequence.  In a pattern or without pattern.  Infrequently or many times each minute. Often times, myoclonus is one of several symptoms in a wide variety of nervous system disorders such as:  Multiple sclerosis.  Parkinson's disease.  Alzheimer's disease.  Creutzfeldt-Jakob disease. Familiar examples of normal myoclonus include:  Hiccups and jerks.  "Sleep starts" that some people have while drifting off to sleep. Severe cases can severely limit a person's ability to:  Eat.  Talk.  Walk. Myoclonic jerks commonly occur in individuals with epilepsy. The most common types of myoclonus include:  Action.  Cortical reflex.  Essential.  Palatal.  Progressive myoclonus epilepsy.  Reticular reflex.  Sleep.  Stimulus-sensitive. TREATMENT  Treatment for myoclonus consists of medicines that may help reduce symptoms. These drugs (many of which are also used to treat epilepsy) include:   Barbiturates.  Clonazepam.  Phenytoin.  Primidone.  Sodium valproate. The complex origins of myoclonus may require the use of multiple drugs. Document Released: 12/30/2001 Document Revised: 04/03/2011 Document Reviewed: 12/12/2012 Exit[employer] Patient Information 2015 [employer]. This information is not intended to replace advice given to you by your health care provider. Make sure you discuss any questions you have with your health care provider.  

## 2013-10-07 NOTE — Telephone Encounter (Signed)
I called patient. The EEG study is normal. The patient is cut back on Lyrica taking only one capsule at night, not 2. This is to see if the myoclonus is improved some. The patient will contact me if she is still not doing well.

## 2013-10-07 NOTE — Procedures (Signed)
    History:  Emily Gay is a 43 year old patient with a history of episodes of myoclonus and some alteration of consciousness. The patient indicates that these events last only a few moments with rapid clearing. The patient is being evaluated for these episodes.  This is a routine EEG. No skull defects are noted. Medications include Celebrex, vitamin D, Flonase, Xyzal, Lyrica, metformin, Robaxin, multivitamins, Protonix, and Ultram.   EEG classification: Normal awake and asleep  Description of the recording: The background rhythms of this recording consists of a fairly well modulated medium amplitude background activity of 10 Hz. As the record progresses, the patient initially is in the waking state, but appears to enter the early stage II sleep during the recording, with rudimentary sleep spindles and vertex sharp wave activity seen. During the wakeful state, photic stimulation is not performed. Hyperventilation was performed, and this results in a minimal buildup of the background rhythm activities without significant slowing seen. At no time during the recording does there appear to be evidence of spike or spike wave discharges or evidence of focal slowing. EKG monitor shows no evidence of cardiac rhythm abnormalities with a heart rate of 84.  Impression: This is a normal EEG recording in the waking and sleeping state. No evidence of ictal or interictal discharges were seen at any time during the recording.

## 2013-10-07 NOTE — Telephone Encounter (Signed)
The patient was seen today in our office as a revisit.

## 2013-10-07 NOTE — Progress Notes (Signed)
Reason for visit: Myoclonus  Emily Gay is an 43 y.o. female  History of present illness:  Emily Gay is a 43 year old right-handed white female with a history of sleep apnea, and she currently is on CPAP, but she is not tolerating the mask well. The patient has had some issues with headaches as well. She reports episodes of muscle jerks of the last 2 years, that seemed to be better initially with the use of Lyrica. Within the last 2 months, there has been some increase in the number of myoclonic episodes that she is now having. The episodes may oftentimes occur when she is trying to get to sleep at night, and may wake her up. She reports that she is under increased stress as she has been promoted at work. The patient may have episodes of jerking of the head with head turning to the right, or she may have jerking of the arm. During the episodes, there may be some clouding of consciousness. The patient may have episodes of sudden onset drowsiness, with a need to sleep. She will be seeing her sleep doctor in the near future. She returns to this office following an MRI evaluation of the brain done recently that was unremarkable with exception of some sinus disease. The patient has been set up for an EEG study, but this has not yet been done.  Past Medical History  Diagnosis Date  . Allergic rhinitis   . Hypertension   . Obesity   . Cholelithiasis   . Prediabetes   . Chronic low back pain   . OSA on CPAP   . 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 Cholecystectomy      Family History  Problem Relation Age of Onset  . Cancer Mother   . Lung disease Mother   . Arthritis Father   . Hypertension Father   . Stroke Father   . Melanoma Sister   . Fibromyalgia Sister   . Mental illness Other     emotional illness (other blood relative)  . Stroke Other     Stroke (parent and grandparent)  . Heart disease Other     (grandparent)  .  Hypertension Other     (other blood relative)  . Arthritis Other     Parent and grandparent    Social history:  reports that she has never smoked. She has never used smokeless tobacco. She reports that she drinks alcohol. She reports that she does not use illicit drugs.   No Known Allergies  Medications:  Current Outpatient Prescriptions on File Prior to Visit  Medication Sig Dispense Refill  . acetaminophen (TYLENOL) 500 MG tablet Take 500 mg by mouth every 6 (six) hours as needed.      . celecoxib (CELEBREX) 200 MG capsule Take 1 capsule (200 mg total) by mouth 2 (two) times daily. For pain & inflammation  60 capsule  5  . Cholecalciferol (VITAMIN D PO) Take 5,000 Units by mouth 2 (two) times daily.      . fluticasone (FLONASE) 50 MCG/ACT nasal spray Place 2 sprays into both nostrils at bedtime.      Marland Kitchen levocetirizine (XYZAL) 5 MG tablet TAKE 1 TABLET BY MOUTH DAILY  30 tablet  4  . LYRICA 75 MG capsule TAKE 1 CAPSULE BY MOUTH 3 TIMES DAILY AND 2 CAPSULES AT NIGHT  150 capsule  3  . metFORMIN (GLUCOPHAGE XR) 500 MG 24 hr tablet Take 4  tablets daily as directed for insulin resistance  120 tablet  99  . methocarbamol (ROBAXIN) 500 MG tablet Take 1 tablet (500 mg total) by mouth 3 (three) times daily as needed for muscle spasms.  90 tablet  0  . methylPREDNIsolone (MEDROL DOSPACK) 4 MG tablet Take 4 mg by mouth as directed.      . Multiple Vitamin (MULTIVITAMIN) capsule Take 1 capsule by mouth daily.      . Olopatadine HCl (PATADAY OP) Apply to eye daily.      . pantoprazole (PROTONIX) 40 MG tablet Take 1 tablet (40 mg total) by mouth daily.  90 tablet  2  . TRAMADOL HCL PO Take 50 mg by mouth. Take 1/2 to 1 tablet every 4-6 hours as needed       No current facility-administered medications on file prior to visit.    ROS:  Out of a complete 14 system review of symptoms, the patient complains only of the following symptoms, and all other reviewed systems are negative.  Fatigue,  excessive sweating Facial swelling, ear pain, runny nose, trouble swallowing Eye itching, eye redness, light sensitivity Blurred vision Cough Heat intolerance, flushing Sleep apnea Joint pain, achy muscles Frequent infections Memory loss, dizziness, headache, numbness, seizures, speech difficulties, weakness, tremors   Blood pressure 138/80, pulse 93, weight 285 lb (129.275 kg).  Physical Exam  General: The patient is alert and cooperative at the time of the examination.The patient is markedly obese.  Skin: No significant peripheral edema is noted.   Neurologic Exam  Mental status: The patient is oriented x 3.  Cranial nerves: Facial symmetry is present. Speech is normal, no aphasia or dysarthria is noted. Extraocular movements are full. Visual fields are full.  Motor: The patient has good strength in all 4 extremities.  Sensory examination: Soft touch sensation is symmetric on the face, arms, and legs.  Coordination: The patient has good finger-nose-finger and heel-to-shin bilaterally.No asterixis is seen, no myoclonus is noted.  Gait and station: The patient has a normal gait. Tandem gait is normal. Romberg is negative. No drift is seen.  Reflexes: Deep tendon reflexes are symmetric.   Assessment/Plan:  One. Episodic myoclonus  2. History of headache  3. Sleep apnea  The patient is reporting some episodes of transient confusion associated with myoclonus. The patient will undergo an EEG study today. She will followup in this office in about 3-4 months. If the EEG is unremarkable, we may try reducing the Lyrica dose in the evening hours to see if this improves her myoclonic jerking episodes. The patient will followup with her sleep physician as well.   Marlan Palau MD 10/07/2013 8:30 PM  [facility] [address] [place], [place] [address]  Phone [phone] Fax [phone]

## 2013-10-07 NOTE — Telephone Encounter (Signed)
Patient calling to state ask whether she needs to come in for an appointment today since Dr. Anne Hahn already called her with MRI results, she also got a call about scheduling an EEG and wants to know if she has to have that done before today's appointment. Please call and advise.

## 2013-10-10 ENCOUNTER — Other Ambulatory Visit: Payer: Self-pay | Admitting: Physician Assistant

## 2013-10-15 ENCOUNTER — Ambulatory Visit (INDEPENDENT_AMBULATORY_CARE_PROVIDER_SITE_OTHER): Payer: BC Managed Care – PPO | Admitting: Cardiovascular Disease

## 2013-10-15 ENCOUNTER — Encounter: Payer: Self-pay | Admitting: Cardiovascular Disease

## 2013-10-15 VITALS — BP 132/80 | HR 84 | Ht 64.0 in | Wt 288.9 lb

## 2013-10-15 DIAGNOSIS — Z9989 Dependence on other enabling machines and devices: Secondary | ICD-10-CM

## 2013-10-15 DIAGNOSIS — I10 Essential (primary) hypertension: Secondary | ICD-10-CM

## 2013-10-15 DIAGNOSIS — K2100 Gastro-esophageal reflux disease with esophagitis, without bleeding: Secondary | ICD-10-CM

## 2013-10-15 DIAGNOSIS — E782 Mixed hyperlipidemia: Secondary | ICD-10-CM

## 2013-10-15 DIAGNOSIS — G4733 Obstructive sleep apnea (adult) (pediatric): Secondary | ICD-10-CM

## 2013-10-15 NOTE — Patient Instructions (Signed)
.  Your physician recommends that you schedule a follow-up appointment in: AS needed with Dr. Tresa Endo. for sleep.

## 2013-10-17 ENCOUNTER — Encounter: Payer: Self-pay | Admitting: Cardiovascular Disease

## 2013-10-17 DIAGNOSIS — G4733 Obstructive sleep apnea (adult) (pediatric): Secondary | ICD-10-CM | POA: Insufficient documentation

## 2013-10-17 NOTE — Progress Notes (Signed)
Patient ID: Emily Gay, female   DOB: [birth year], 42 y.o.   MRN: [id number]     HPI: Emily Gay is a 43 y.o. female who is referred by Vicie Mutters for sleep evaluation following initiation of CPAP therapy for recently diagnosed obstructive sleep apnea.  Emily Gay is a 43 year old female who has a history of hypertension, hyperlipidemia, and is felt to be prediabetic.  Due to the concerns for obstructive sleep apnea she was referred by Vicie Mutters, [facility] for a diagnostic sleep study in August 2014.  At that time, she had complaints of loud snoring, nonrestorative sleep, and excessive daytime sleepiness.  She was found to have mild sleep apnea.  Overall, with an AHI of 9.8; however, with REM sleep.  Her sleep apnea was severe, with an AHI of 47.4.  She also dropped her oxygen saturation is 76% with non-REM sleep and had evidence for loud snoring.  She was referred for use CPAP titration trial ultimately had this done in April 2015.  She was titrated up to a 10 cm water pressure with excellent benefit noted during her evaluation.  She was started on CPAP therapy.  A download obtained from 06/02/2013 through 10/12/2013 reveals poor compliance.  She has an air since 10 are to set you which has been set at 10 cm.  She states she initially had a nasal mask, as well as a fullface mask.  She had difficulty with headaches and she fell that this was contributed by the clasp on the headgear.  She has only used her equipment 32% of the days in only 18% of the days with their greater than 4 hours.  Use.  Her average usage on days used was 4 hours and 22 minutes.  However, when using it, her AHI was excellent at 0.5.  A review of her records indicates that she had very good CPAP compliance in June, but essentially in August and September has not been using this.  She was felt possibly to have tension headaches or TMJ.  She was also seen by neurology for these headaches.  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 In the past for this.  Presently, she denies residual daytime sleepiness.   Epworth Sleepiness Scale: Situation   Chance of Dozing/Sleeping (0 = never , 1 = slight chance , 2 = moderate chance , 3 = high chance )   sitting and reading 0   watching TV 1   sitting inactive in a public place 0   being a passenger in a motor vehicle for an hour or more 0   lying down in the afternoon 1   sitting and talking to someone 0   sitting quietly after lunch (no alcohol) 0   while stopped for a few minutes in traffic as the driver 0   Total Score  2    Past Medical History  Diagnosis Date  . Allergic rhinitis   . Hypertension   . Obesity   . Cholelithiasis   . Prediabetes   . Chronic low back pain   . OSA on CPAP   . 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 Cholecystectomy      No Known Allergies  Current Outpatient Prescriptions  Medication Sig Dispense Refill  . acetaminophen (TYLENOL) 500 MG tablet Take 500 mg by mouth every 6 (six) hours as needed.      . celecoxib (CELEBREX) 200  MG capsule Take 1 capsule (200 mg total) by mouth 2 (two) times daily. For pain & inflammation  60 capsule  5  . Cholecalciferol (VITAMIN D PO) Take 5,000 Units by mouth 2 (two) times daily.      . fluticasone (FLONASE) 50 MCG/ACT nasal spray Place 2 sprays into both nostrils at bedtime.      Marland Kitchen levocetirizine (XYZAL) 5 MG tablet TAKE 1 TABLET BY MOUTH DAILY  30 tablet  4  . LYRICA 75 MG capsule TAKE 1 CAPSULE BY MOUTH 3 TIMES DAILY AND 2 CAPSULES AT NIGHT  150 capsule  0  . metFORMIN (GLUCOPHAGE XR) 500 MG 24 hr tablet Take 4 tablets daily as directed for insulin resistance  120 tablet  99  . methocarbamol (ROBAXIN) 500 MG tablet Take 1 tablet (500 mg total) by mouth 3 (three) times daily as needed for muscle spasms.  90 tablet  0  . methylPREDNIsolone (MEDROL DOSPACK) 4 MG tablet Take 4 mg by mouth as directed.      . Multiple Vitamin  (MULTIVITAMIN) capsule Take 1 capsule by mouth daily.      . Olopatadine HCl (PATADAY OP) Apply to eye daily.      . pantoprazole (PROTONIX) 40 MG tablet Take 1 tablet (40 mg total) by mouth daily.  90 tablet  2  . TRAMADOL HCL PO Take 50 mg by mouth. Take 1/2 to 1 tablet every 4-6 hours as needed       No current facility-administered medications for this visit.    History   Social History  . Marital Status: Married    Spouse Name: N/A    Number of Children: N/A  . Years of Education: college   Occupational History  . Manager    Social History Main Topics  . Smoking status: Never Smoker   . Smokeless tobacco: Never Used  . Alcohol Use: Yes     Comment: occasionally  . Drug Use: No  . Sexual Activity: Not on file   Other Topics Concern  . 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  She does not have children.  She has 5 cats.  Family history is notable that her mother died at age 70 with a lymphoma.  Her father is living at age 1, but has numerous sinus conditions and most likely has sleep apnea, but never treated.  She has one brother and 2 sisters.  ROS General: Negative; No fevers, chills, or night sweats; positive for morbid obesity HEENT: Negative; No changes in vision or hearing, sinus congestion, difficulty swallowing Pulmonary: Negative; No cough, wheezing, shortness of breath, hemoptysis Cardiovascular: Negative; No chest pain, presyncope, syncope, palpatations GI: Positive for GERD No nausea, vomiting, diarrhea, or abdominal pain GU: Negative; No dysuria, hematuria, or difficulty voiding Musculoskeletal: Negative; no myalgias, joint pain, or weakness Hematologic: Negative; no easy bruising, bleeding Endocrine: Positive for  borderline diabetes;  no heat/cold intolerance Neuro: Positive for left sided headach, which she had attributed to her full face mask.; no changes in balance,  Skin: Negative; No rashes or skin lesions Psychiatric: Negative; No behavioral problems, depression Sleep: Positive for obstructive sleep apnea with possible bruxism; No daytime sleepiness, hypersomnolence, , restless legs, hypnogognic hallucinations, no cataplexy   Physical Exam BP 132/80  Pulse 84  Ht $R'5\' 4"'XP$  (1.626 m)  Wt 288 lb 14.4 oz (131.044 kg)  BMI 49.57 kg/m2  General: Alert, oriented, no distress; morbid obesity Skin: normal turgor, no rashes HEENT: Normocephalic, atraumatic. Pupils round and reactive; sclera anicteric; extraocular muscles intact; Fundi without hemorrhages or exudates Nose without nasal septal hypertrophy Mouth/Parynx benign; Mallinpatti scale 3 Neck: No JVD, no carotid briuts Lungs: clear to ausculatation and percussion; no wheezing or rales  Chest wall: No tenderness to palpation Heart: RRR, s1 s2 normal  Abdomen: soft, nontender; no hepatosplenomehaly, BS+; abdominal aorta nontender and not dilated by palpation. Back: No CVA tenderness Pulses 2+ Extremities: no clubbinbg cyanosis or edema, Homan's sign negative  Neurologic: grossly nonfocal; cranial nerves intact. Psychological: Normal affect and mood.  LABS:  BMET    Component Value Date/Time   NA 138 08/11/2013 1611   K 4.7 08/11/2013 1611   CL 97 08/11/2013 1611   CO2 29 08/11/2013 1611   GLUCOSE 68* 08/11/2013 1611   BUN 18 08/11/2013 1611   CREATININE 0.61 08/11/2013 1611   CREATININE 0.6 11/30/2011 1618   CALCIUM 9.9 08/11/2013 1611   GFRNONAA >89 02/21/2013 1153   GFRNONAA >60 03/03/2009 0534   GFRAA >89 02/21/2013 1153   GFRAA  Value: >60        The eGFR has been calculated using the MDRD equation. This calculation has not been validated in all clinical situations. eGFR's persistently <60 mL/min signify possible Chronic Kidney Disease.  03/03/2009 0534     Hepatic Function Panel     Component Value Date/Time   PROT 8.0 08/11/2013 1611   ALBUMIN 4.7 08/11/2013 1611   AST 12 08/11/2013 1611   ALT 11 08/11/2013 1611   ALKPHOS 72 08/11/2013 1611   BILITOT 0.2 08/11/2013 1611   BILIDIR <0.1 02/21/2013 1153   IBILI NOT CALC 02/21/2013 1153     CBC    Component Value Date/Time   WBC 16.5* 08/11/2013 1611   RBC 4.98 08/11/2013 1611   HGB 13.4 08/11/2013 1611   HCT 40.4 08/11/2013 1611   PLT 443* 08/11/2013 1611   MCV 81.1 08/11/2013 1611   MCH 26.9 08/11/2013 1611   MCHC 33.2 08/11/2013 1611   RDW 14.4 08/11/2013 1611   LYMPHSABS 4.1* 08/11/2013 1611   MONOABS 1.5* 08/11/2013 1611   EOSABS 0.2 08/11/2013 1611   BASOSABS 0.0 08/11/2013 1611     BNP No results found for this basename: probnp    Lipid Panel     Component Value Date/Time   CHOL 179 05/16/2013 1152   TRIG 127 05/16/2013 1152   HDL 42 05/16/2013 1152   CHOLHDL 4.3 05/16/2013 1152   VLDL 25 05/16/2013 1152   LDLCALC 112* 05/16/2013 1152   LDLDIRECT 134.4 07/26/2011 1446     RADIOLOGY: Mr Brain Wo Contrast  10/06/2013     [facility] [address], [place], [place] [address] [phone]  NEUROIMAGING REPORT   STUDY DATE: 10/05/2012 PATIENT NAME: Emily Gay DOB: January 05, 1971 MRN: [id number]  ORDERING CLINICIAN: Dr Jannifer Franklin CLINICAL HISTORY: 32 year patient with headaches and myoclonus COMPARISON FILMS:  MRI Brain 09/03/2011 EXAM: MRI Brain wo TECHNIQUE: MRI of the brain without contrast was obtained utilizing 5 mm  axial slices with T1, T2, T2 flair, T2 star gradient echo and diffusion  weighted views.  T1 sagittal and T2 coronal views were obtained. CONTRAST: none IMAGING SITE: [facility]  FINDINGS:  The brain parenchyma shows no abnormal signal intensities. No structural  lesion, tumor infarcts are noted. The paranasal sinuses show chronic  inflammatory changes involving both ethmoid, maxillary and sigmoid  sinuses.No abnormal lesions are seen  on diffusion-weighted views to  suggest acute ischemia. The cortical sulci, fissures and cisterns are  normal in size and appearance. Lateral, third and fourth ventricle are  normal in size and appearance. No extra-axial fluid collections are seen.  No evidence of mass effect or midline shift.  On sagittal views the  posterior fossa, pituitary gland and corpus callosum are unremarkable. No  evidence of intracranial hemorrhage on gradient-echo views. The orbits and  their contents, paranasal sinuses and calvarium are unremarkable.   Intracranial flow voids are present.      10/06/2013    Unremarkable MRI scan of the brain without contrast. Chronic  severe paranasal sinusitis changes are noted incidentally which appear  more advanced compared with MRI scan dated 09/03/2011    INTERPRETING PHYSICIAN:  PRAMOD SETHI, MD Certified in  Neuroimaging by [employer] and [employer] [facility] Neurological Subspecialities       ASSESSMENT AND PLAN: Emily Gay is a 42 year old female with a significant history of morbid obesity, hypertension, hyperlipidemia, and probable diabetes mellitus.  She has mild sleep apnea.  overall, noted on her diagnostic polysomnogram however, with REM sleep her sleep apnea is severe.  I had a  long discussion with her regarding normal sleep architecture and adverse effect on this  If sleep apnea is present.  We discussed also that the majority of REM sleep occurs in the latter portion of the night and in light of her severity of sleep apnea with REM sleep that itt is imperative that she use CPAP therapy for the duration of the evening.  I suspect some of her noncompliance was due to her headache and feeling that this was related to the CPAP mask as well as sinus infection. In the office today she was given several meds to try for comfort.  I reviewed her download in detail.  When she is using CPAP therapy her AHI is excellent at 0.5/hr.  I discussed the adverse  consequences of obstructive sleep apnea in her overall as well as cardiovascular health if her sleep apnea is left untreated.  A followup download will be obtained in approximately 30 days and hopefully compliance will be significantly improved.  I'll be available to see her as needed fibrously perspective issues arise.     Troy Sine, MD, [facility]  10/17/2013 6:48 PM

## 2013-10-21 ENCOUNTER — Other Ambulatory Visit: Payer: Self-pay | Admitting: Emergency Medicine

## 2013-11-04 ENCOUNTER — Other Ambulatory Visit: Payer: Self-pay | Admitting: Physician Assistant

## 2013-11-13 ENCOUNTER — Encounter: Payer: Self-pay | Admitting: Physician Assistant

## 2013-11-13 ENCOUNTER — Ambulatory Visit (INDEPENDENT_AMBULATORY_CARE_PROVIDER_SITE_OTHER): Payer: BC Managed Care – PPO | Admitting: Physician Assistant

## 2013-11-13 VITALS — BP 128/80 | HR 84 | Temp 97.9°F | Resp 16 | Ht 63.25 in | Wt 292.0 lb

## 2013-11-13 DIAGNOSIS — Z9989 Dependence on other enabling machines and devices: Secondary | ICD-10-CM

## 2013-11-13 DIAGNOSIS — E559 Vitamin D deficiency, unspecified: Secondary | ICD-10-CM

## 2013-11-13 DIAGNOSIS — E782 Mixed hyperlipidemia: Secondary | ICD-10-CM

## 2013-11-13 DIAGNOSIS — M26629 Arthralgia of temporomandibular joint, unspecified side: Secondary | ICD-10-CM

## 2013-11-13 DIAGNOSIS — Z23 Encounter for immunization: Secondary | ICD-10-CM

## 2013-11-13 DIAGNOSIS — R7303 Prediabetes: Secondary | ICD-10-CM

## 2013-11-13 DIAGNOSIS — R7309 Other abnormal glucose: Secondary | ICD-10-CM

## 2013-11-13 DIAGNOSIS — I10 Essential (primary) hypertension: Secondary | ICD-10-CM

## 2013-11-13 DIAGNOSIS — Z79899 Other long term (current) drug therapy: Secondary | ICD-10-CM

## 2013-11-13 DIAGNOSIS — M2662 Arthralgia of temporomandibular joint: Secondary | ICD-10-CM

## 2013-11-13 DIAGNOSIS — G44209 Tension-type headache, unspecified, not intractable: Secondary | ICD-10-CM

## 2013-11-13 DIAGNOSIS — K2100 Gastro-esophageal reflux disease with esophagitis, without bleeding: Secondary | ICD-10-CM

## 2013-11-13 DIAGNOSIS — R6889 Other general symptoms and signs: Secondary | ICD-10-CM

## 2013-11-13 DIAGNOSIS — G4733 Obstructive sleep apnea (adult) (pediatric): Secondary | ICD-10-CM

## 2013-11-13 DIAGNOSIS — Z0001 Encounter for general adult medical examination with abnormal findings: Secondary | ICD-10-CM

## 2013-11-13 LAB — CBC WITH DIFFERENTIAL/PLATELET
Basophils Absolute: 0 10*3/uL (ref 0.0–0.1)
Basophils Relative: 0 % (ref 0–1)
EOS ABS: 0.2 10*3/uL (ref 0.0–0.7)
EOS PCT: 2 % (ref 0–5)
HCT: 35.4 % — ABNORMAL LOW (ref 36.0–46.0)
Hemoglobin: 11.9 g/dL — ABNORMAL LOW (ref 12.0–15.0)
Lymphocytes Relative: 24 % (ref 12–46)
Lymphs Abs: 2.4 10*3/uL (ref 0.7–4.0)
MCH: 27.4 pg (ref 26.0–34.0)
MCHC: 33.6 g/dL (ref 30.0–36.0)
MCV: 81.4 fL (ref 78.0–100.0)
Monocytes Absolute: 0.8 10*3/uL (ref 0.1–1.0)
Monocytes Relative: 8 % (ref 3–12)
NEUTROS PCT: 66 % (ref 43–77)
Neutro Abs: 6.5 10*3/uL (ref 1.7–7.7)
Platelets: 382 10*3/uL (ref 150–400)
RBC: 4.35 MIL/uL (ref 3.87–5.11)
RDW: 14.3 % (ref 11.5–15.5)
WBC: 9.9 10*3/uL (ref 4.0–10.5)

## 2013-11-13 MED ORDER — METRONIDAZOLE 1 % EX GEL: Freq: Every day | CUTANEOUS | Status: DC

## 2013-11-13 MED ORDER — TRAMADOL HCL 50 MG PO TABS: 50.0000 mg | ORAL_TABLET | Freq: Three times a day (TID) | ORAL | Status: DC | PRN

## 2013-11-13 NOTE — Progress Notes (Signed)
Complete Physical  Assessment and Plan: Allergic rhinitis- Allegra OTC, increase H20, allergy hygiene explained.  Hypertension-- continue medications, DASH diet, exercise and monitor at home. Call if greater than 130/80.   Obesity-Obesity with co morbidities- long discussion about weight loss, diet, and exercise  Prediabetes-Discussed general issues about diabetes pathophysiology and management., Educational material distributed., Suggested low cholesterol diet., Encouraged aerobic exercise., Discussed foot care., Reminded to get yearly retinal exam.  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   Nocturnal myoclonus- better with lyrica.    Discussed med's effects and SE's. Screening labs and tests as requested with regular follow-up as recommended.  HPI  43 y.o. female  presents for a complete physical.  Her blood pressure has been controlled at home, today their BP is BP: 128/80 mmHg She does not workout due to chronic pain. She denies chest pain, shortness of breath, dizziness.  She has chronic pain in her lower back, follows with ortho and has had ESI without much help and she is suppose to get an ablation. She also follows with Dr. Nickola Gay, she was recently prescribed prednisone.  She is not on cholesterol medication and denies myalgias. Her cholesterol is at goal. The cholesterol last visit was:   Lab Results  Component Value Date   CHOL 179 05/16/2013   HDL 42 05/16/2013   LDLCALC 112* 05/16/2013   LDLDIRECT 134.4 07/26/2011   TRIG 127 05/16/2013   CHOLHDL 4.3 05/16/2013   She has been working on diet and exercise for prediabetes, and denies polydipsia, polyuria and visual disturbances. Last A1C in the office was:  Lab Results  Component Value Date   HGBA1C 6.0* 05/16/2013  Patient is on Vitamin D supplement.   Lab Results  Component Value Date   VD25OH  60 05/16/2013     She is morbidly obese with a BMI of 50, she had + OSA and was put on CPAP, she had + study got on CPAP but she started to have headaches. She went to her neurologist, normal MRI/EEG and everything was normal, she stopped the CPAP and felt better.  Wt Readings from Last 3 Encounters:  11/13/13 292 lb (132.45 kg)  10/15/13 288 lb 14.4 oz (131.044 kg)  10/07/13 285 lb (129.275 kg)    Current Medications:  Current Outpatient Prescriptions on File Prior to Visit  Medication Sig Dispense Refill  . acetaminophen (TYLENOL) 500 MG tablet Take 500 mg by mouth every 6 (six) hours as needed.      . celecoxib (CELEBREX) 200 MG capsule Take 1 capsule (200 mg total) by mouth 2 (two) times daily. For pain & inflammation  60 capsule  5  . Cholecalciferol (VITAMIN D PO) Take 5,000 Units by mouth 2 (two) times daily.      . fluticasone (FLONASE) 50 MCG/ACT nasal spray Place 2 sprays into both nostrils at bedtime.      Marland Kitchen. levocetirizine (XYZAL) 5 MG tablet TAKE 1 TABLET BY MOUTH DAILY  30 tablet  3  . LYRICA 75 MG capsule TAKE 1 CAPSULE BY MOUTH 3 TIMES DAILY AND 2 CAPSULES AT NIGHT  150 capsule  0  . metFORMIN (GLUCOPHAGE XR) 500 MG 24 hr tablet Take 4 tablets daily as directed for insulin resistance  120 tablet  99  . methocarbamol (ROBAXIN) 500 MG tablet Take 1 tablet (500 mg total) by mouth 3 (three)  times daily as needed for muscle spasms.  90 tablet  0  . methylPREDNIsolone (MEDROL DOSPACK) 4 MG tablet Take 4 mg by mouth as directed.      . Multiple Vitamin (MULTIVITAMIN) capsule Take 1 capsule by mouth daily.      . Olopatadine HCl (PATADAY OP) Apply to eye daily.      . pantoprazole (PROTONIX) 40 MG tablet TAKE 1 TABLET (40 MG TOTAL) BY MOUTH DAILY.  90 tablet  1  . TRAMADOL HCL PO Take 50 mg by mouth. Take 1/2 to 1 tablet every 4-6 hours as needed       No current facility-administered medications on file prior to visit.   Health Maintenance:   Immunization History  Administered  Date(s) Administered  . Influenza Split 11/13/2011, 11/13/2013  . Influenza Whole 11/25/2008  . Td 01/23/1990  . Tdap 08/21/2012   Tetanus: 2014 Pneumovax:  Flu vaccine: 2015 Zostavax: LMP: 2 week ago, not regular Pap: 2014 MGM: 08/2012  DEXA: Colonoscopy: EGD: Last Eye Exam: Dr. Shea Gay  Patient Care Team: Emily Cowboy, MD as PCP - General (Internal Medicine)  Allergies: No Known Allergies Medical History:  Past Medical History  Diagnosis Date  . Allergic rhinitis   . Hypertension   . Obesity   . Cholelithiasis   . Prediabetes   . Chronic low back pain   . OSA on CPAP   . 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   Surgical History:  Past Surgical History  Procedure Laterality Date  . Cholecystectomy     Family History:  Family History  Problem Relation Age of Onset  . Cancer Mother   . Lung disease Mother   . Arthritis Father   . Hypertension Father   . Stroke Father   . Melanoma Sister   . Fibromyalgia Sister   . Mental illness Other     emotional illness (other blood relative)  . Stroke Other     Stroke (parent and grandparent)  . Heart disease Other     (grandparent)  . Hypertension Other     (other blood relative)  . Arthritis Other     Parent and grandparent   Social History:  History  Substance Use Topics  . Smoking status: Never Smoker   . Smokeless tobacco: Never Used  . Alcohol Use: Yes     Comment: occasionally   Review of Systems: see HPI  Physical Exam: Estimated body mass index is 51.29 kg/(m^2) as calculated from the following:   Height as of this encounter: 5' 3.25" (1.607 m).   Weight as of this encounter: 292 lb (132.45 kg). BP 128/80  Pulse 84  Temp(Src) 97.9 F (36.6 C)  Resp 16  Ht 5' 3.25" (1.607 m)  Wt 292 lb (132.45 kg)  BMI 51.29 kg/m2 General Appearance: Well nourished, in no apparent distress.  Eyes: PERRLA, EOMs, conjunctiva no swelling or erythema, normal fundi and vessels.  Sinuses: No Frontal/maxillary tenderness   ENT/Mouth: Ext aud canals clear, normal light reflex with TMs without erythema, bulging. Good dentition. No erythema, swelling, or exudate on post pharynx. Tonsils not swollen or erythematous. Hearing normal.  Neck: Supple, thyroid normal. No bruits  Respiratory: Respiratory effort normal, BS equal bilaterally without rales, rhonchi, wheezing or stridor.  Cardio: RRR without murmurs, rubs or gallops. Brisk peripheral pulses with 1+edema.  Chest: symmetric, with normal excursions and percussion.  Breasts: Symmetric, without lumps, nipple discharge, retractions.  Abdomen: Soft, nontender, obese no guarding, rebound, hernias, masses, or organomegaly. .  Lymphatics: Non  tender without lymphadenopathy.  Genitourinary: defer Musculoskeletal: Full ROM all peripheral extremities,5/5 strength, pain with change in position. Skin: Warm, dry without rashes, lesions, ecchymosis. Neuro: Cranial nerves intact, reflexes equal bilaterally. Normal muscle tone, no cerebellar symptoms. Sensation intact.  Psych: Awake and oriented X 3, normal affect, Insight and Judgment appropriate.   EKG: WNL no changes.   Emily Gay, Emily Gay 3:54 PM [facility]

## 2013-11-13 NOTE — Patient Instructions (Signed)
 Bad carbs also include fruit juice, alcohol, and sweet tea. These are empty calories that do not signal to your brain that you are full.   Please remember the good carbs are still carbs which convert into sugar. So please measure them out no more than 1/2-1 cup of rice, oatmeal, pasta, and beans.  Veggies are however free foods! Pile them on.   I like lean protein at every meal such as chicken, turkey, pork chops, cottage cheese, etc. Just do not fry these meats and please center your meal around vegetable, the meats should be a side dish.   No all fruit is created equal. Please see the list below, the fruit at the bottom is higher in sugars than the fruit at the top   Preventative Care for Adults - Female      MAINTAIN REGULAR HEALTH EXAMS:  A routine yearly physical is a good way to check in with your primary care provider about your health and preventive screening. It is also an opportunity to share updates about your health and any concerns you have, and receive a thorough all-over exam.   Most health insurance companies pay for at least some preventative services.  Check with your health plan for specific coverages.  WHAT PREVENTATIVE SERVICES DO WOMEN NEED?  Adult women should have their weight and blood pressure checked regularly.   Women age 35 and older should have their cholesterol levels checked regularly.  Women should be screened for cervical cancer with a Pap smear and pelvic exam beginning at either age 21, or 3 years after they become sexually activity.    Breast cancer screening generally begins at age 40 with a mammogram and breast exam by your primary care provider.    Beginning at age 50 and continuing to age 75, women should be screened for colorectal cancer.  Certain people may need continued testing until age 85.  Updating vaccinations is part of preventative care.  Vaccinations help protect against diseases such as the flu.  Osteoporosis is a disease in  which the bones lose minerals and strength as we age. Women ages 65 and over should discuss this with their caregivers, as should women after menopause who have other risk factors.  Lab tests are generally done as part of preventative care to screen for anemia and blood disorders, to screen for problems with the kidneys and liver, to screen for bladder problems, to check blood sugar, and to check your cholesterol level.  Preventative services generally include counseling about diet, exercise, avoiding tobacco, drugs, excessive alcohol consumption, and sexually transmitted infections.    GENERAL RECOMMENDATIONS FOR GOOD HEALTH:  Healthy diet:  Eat a variety of foods, including fruit, vegetables, animal or vegetable protein, such as meat, fish, chicken, and eggs, or beans, lentils, tofu, and grains, such as rice.  Drink plenty of water daily.  Decrease saturated fat in the diet, avoid lots of red meat, processed foods, sweets, fast foods, and fried foods.  Exercise:  Aerobic exercise helps maintain good heart health. At least 30-40 minutes of moderate-intensity exercise is recommended. For example, a brisk walk that increases your heart rate and breathing. This should be done on most days of the week.   Find a type of exercise or a variety of exercises that you enjoy so that it becomes a part of your daily life.  Examples are running, walking, swimming, water aerobics, and biking.  For motivation and support, explore group exercise such as aerobic class, spin class,   Zumba, Yoga,or  martial arts, etc.    Set exercise goals for yourself, such as a certain weight goal, walk or run in a race such as a 5k walk/run.  Speak to your primary care provider about exercise goals.  Disease prevention:  If you smoke or chew tobacco, find out from your caregiver how to quit. It can literally save your life, no matter how long you have been a tobacco user. If you do not use tobacco, never begin.   Maintain  a healthy diet and normal weight. Increased weight leads to problems with blood pressure and diabetes.   The Body Mass Index or BMI is a way of measuring how much of your body is fat. Having a BMI above 27 increases the risk of heart disease, diabetes, hypertension, stroke and other problems related to obesity. Your caregiver can help determine your BMI and based on it develop an exercise and dietary program to help you achieve or maintain this important measurement at a healthful level.  High blood pressure causes heart and blood vessel problems.  Persistent high blood pressure should be treated with medicine if weight loss and exercise do not work.   Fat and cholesterol leaves deposits in your arteries that can block them. This causes heart disease and vessel disease elsewhere in your body.  If your cholesterol is found to be high, or if you have heart disease or certain other medical conditions, then you may need to have your cholesterol monitored frequently and be treated with medication.   Ask if you should have a cardiac stress test if your history suggests this. A stress test is a test done on a treadmill that looks for heart disease. This test can find disease prior to there being a problem.  Menopause can be associated with physical symptoms and risks. Hormone replacement therapy is available to decrease these. You should talk to your caregiver about whether starting or continuing to take hormones is right for you.   Osteoporosis is a disease in which the bones lose minerals and strength as we age. This can result in serious bone fractures. Risk of osteoporosis can be identified using a bone density scan. Women ages 65 and over should discuss this with their caregivers, as should women after menopause who have other risk factors. Ask your caregiver whether you should be taking a calcium supplement and Vitamin D, to reduce the rate of osteoporosis.   Avoid drinking alcohol in excess (more than  two drinks per day).  Avoid use of street drugs. Do not share needles with anyone. Ask for professional help if you need assistance or instructions on stopping the use of alcohol, cigarettes, and/or drugs.  Brush your teeth twice a day with fluoride toothpaste, and floss once a day. Good oral hygiene prevents tooth decay and gum disease. The problems can be painful, unattractive, and can cause other health problems. Visit your dentist for a routine oral and dental check up and preventive care every 6-12 months.   Look at your skin regularly.  Use a mirror to look at your back. Notify your caregivers of changes in moles, especially if there are changes in shapes, colors, a size larger than a pencil eraser, an irregular border, or development of new moles.  Safety:  Use seatbelts 100% of the time, whether driving or as a passenger.  Use safety devices such as hearing protection if you work in environments with loud noise or significant background noise.  Use safety glasses when doing   any work that could send debris in to the eyes.  Use a helmet if you ride a bike or motorcycle.  Use appropriate safety gear for contact sports.  Talk to your caregiver about gun safety.  Use sunscreen with a SPF (or skin protection factor) of 15 or greater.  Lighter skinned people are at a greater risk of skin cancer. Don't forget to also wear sunglasses in order to protect your eyes from too much damaging sunlight. Damaging sunlight can accelerate cataract formation.   Practice safe sex. Use condoms. Condoms are used for birth control and to help reduce the spread of sexually transmitted infections (or STIs).  Some of the STIs are gonorrhea (the clap), chlamydia, syphilis, trichomonas, herpes, HPV (human papilloma virus) and HIV (human immunodeficiency virus) which causes AIDS. The herpes, HIV and HPV are viral illnesses that have no cure. These can result in disability, cancer and death.   Keep carbon monoxide and smoke  detectors in your home functioning at all times. Change the batteries every 6 months or use a model that plugs into the wall.   Vaccinations:  Stay up to date with your tetanus shots and other required immunizations. You should have a booster for tetanus every 10 years. Be sure to get your flu shot every year, since 5%-20% of the [place] population comes down with the flu. The flu vaccine changes each year, so being vaccinated once is not enough. Get your shot in the fall, before the flu season peaks.   Other vaccines to consider:  Human Papilloma Virus or HPV causes cancer of the cervix, and other infections that can be transmitted from person to person. There is a vaccine for HPV, and females should get immunized between the ages of 11 and 26. It requires a series of 3 shots.   Pneumococcal vaccine to protect against certain types of pneumonia.  This is normally recommended for adults age 65 or older.  However, adults younger than 41 years old with certain underlying conditions such as diabetes, heart or lung disease should also receive the vaccine.  Shingles vaccine to protect against Varicella Zoster if you are older than age 60, or younger than 41 years old with certain underlying illness.  Hepatitis A vaccine to protect against a form of infection of the liver by a virus acquired from food.  Hepatitis B vaccine to protect against a form of infection of the liver by a virus acquired from blood or body fluids, particularly if you work in health care.  If you plan to travel internationally, check with your local health department for specific vaccination recommendations.  Cancer Screening:  Breast cancer screening is essential to preventive care for women. All women age 20 and older should perform a breast self-exam every month. At age 40 and older, women should have their caregiver complete a breast exam each year. Women at ages 40 and older should have a mammogram (x-ray film) of the breasts.  Your caregiver can discuss how often you need mammograms.    Cervical cancer screening includes taking a Pap smear (sample of cells examined under a microscope) from the cervix (end of the uterus). It also includes testing for HPV (Human Papilloma Virus, which can cause cervical cancer). Screening and a pelvic exam should begin at age 21, or 3 years after a woman becomes sexually active. Screening should occur every year, with a Pap smear but no HPV testing, up to age 30. After age 30, you should have a Pap smear   every 3 years with HPV testing, if no HPV was found previously.   Most routine colon cancer screening begins at the age of 50. On a yearly basis, doctors may provide special easy to use take-home tests to check for hidden blood in the stool. Sigmoidoscopy or colonoscopy can detect the earliest forms of colon cancer and is life saving. These tests use a small camera at the end of a tube to directly examine the colon. Speak to your caregiver about this at age 50, when routine screening begins (and is repeated every 5 years unless early forms of pre-cancerous polyps or small growths are found).   

## 2013-11-14 LAB — LIPID PANEL
CHOL/HDL RATIO: 4.2 ratio
CHOLESTEROL: 186 mg/dL (ref 0–200)
HDL: 44 mg/dL (ref >39)
LDL Cholesterol: 115 mg/dL — ABNORMAL HIGH (ref 0–99)
TRIGLYCERIDES: 137 mg/dL (ref <150)
VLDL: 27 mg/dL (ref 0–40)

## 2013-11-14 LAB — URINALYSIS, ROUTINE W REFLEX MICROSCOPIC
Bilirubin Urine: NEGATIVE
GLUCOSE, UA: NEGATIVE mg/dL
Hgb urine dipstick: NEGATIVE
KETONES UR: NEGATIVE mg/dL
Leukocytes, UA: NEGATIVE
Nitrite: NEGATIVE
PH: 6.5 (ref 5.0–8.0)
Protein, ur: NEGATIVE mg/dL
Specific Gravity, Urine: 1.01 (ref 1.005–1.030)
UROBILINOGEN UA: 0.2 mg/dL (ref 0.0–1.0)

## 2013-11-14 LAB — MICROALBUMIN / CREATININE URINE RATIO
CREATININE, URINE: 50.2 mg/dL
MICROALB UR: 0.2 mg/dL (ref <2.0)
MICROALB/CREAT RATIO: 4 mg/g (ref 0.0–30.0)

## 2013-11-14 LAB — HEMOGLOBIN A1C
Hgb A1c MFr Bld: 5.6 % (ref <5.7)
Mean Plasma Glucose: 114 mg/dL (ref <117)

## 2013-11-14 LAB — HEPATIC FUNCTION PANEL
ALBUMIN: 4 g/dL (ref 3.5–5.2)
ALT: 13 U/L (ref 0–35)
AST: 15 U/L (ref 0–37)
Alkaline Phosphatase: 80 U/L (ref 39–117)
BILIRUBIN TOTAL: 0.3 mg/dL (ref 0.2–1.2)
Bilirubin, Direct: 0.1 mg/dL (ref 0.0–0.3)
Indirect Bilirubin: 0.2 mg/dL (ref 0.2–1.2)
Total Protein: 6.7 g/dL (ref 6.0–8.3)

## 2013-11-14 LAB — IRON AND TIBC
%SAT: 20 % (ref 20–55)
IRON: 64 ug/dL (ref 42–145)
TIBC: 315 ug/dL (ref 250–470)
UIBC: 251 ug/dL (ref 125–400)

## 2013-11-14 LAB — VITAMIN B12: VITAMIN B 12: 654 pg/mL (ref 211–911)

## 2013-11-14 LAB — BASIC METABOLIC PANEL WITH GFR
BUN: 11 mg/dL (ref 6–23)
CALCIUM: 8.8 mg/dL (ref 8.4–10.5)
CO2: 29 mEq/L (ref 19–32)
CREATININE: 0.62 mg/dL (ref 0.50–1.10)
Chloride: 104 mEq/L (ref 96–112)
GFR, Est African American: >89 mL/min
Glucose, Bld: 81 mg/dL (ref 70–99)
Potassium: 4.1 mEq/L (ref 3.5–5.3)
SODIUM: 139 meq/L (ref 135–145)

## 2013-11-14 LAB — TSH: TSH: 2.263 u[IU]/mL (ref 0.350–4.500)

## 2013-11-14 LAB — INSULIN, FASTING: Insulin fasting, serum: 16.3 u[IU]/mL (ref 2.0–19.6)

## 2013-11-14 LAB — MAGNESIUM: MAGNESIUM: 1.9 mg/dL (ref 1.5–2.5)

## 2013-11-14 LAB — FERRITIN: Ferritin: 80 ng/mL (ref 10–291)

## 2013-11-14 LAB — VITAMIN D 25 HYDROXY (VIT D DEFICIENCY, FRACTURES): Vit D, 25-Hydroxy: 54 ng/mL (ref 30–89)

## 2013-11-17 ENCOUNTER — Other Ambulatory Visit: Payer: Self-pay | Admitting: Physician Assistant

## 2013-11-18 ENCOUNTER — Other Ambulatory Visit: Payer: Self-pay | Admitting: Physician Assistant

## 2013-11-19 ENCOUNTER — Other Ambulatory Visit: Payer: Self-pay

## 2013-11-19 MED ORDER — PREGABALIN 75 MG PO CAPS: ORAL_CAPSULE | ORAL | Status: DC

## 2013-11-30 ENCOUNTER — Encounter: Payer: Self-pay | Admitting: Physician Assistant

## 2013-12-13 ENCOUNTER — Other Ambulatory Visit: Payer: Self-pay | Admitting: Physician Assistant

## 2013-12-15 ENCOUNTER — Ambulatory Visit: Payer: BC Managed Care – PPO | Admitting: Adult Health

## 2013-12-16 ENCOUNTER — Other Ambulatory Visit: Payer: Self-pay | Admitting: Physician Assistant

## 2013-12-17 ENCOUNTER — Ambulatory Visit: Payer: BC Managed Care – PPO | Admitting: Adult Health

## 2014-01-07 ENCOUNTER — Encounter: Payer: Self-pay | Admitting: Adult Health

## 2014-01-07 ENCOUNTER — Ambulatory Visit (INDEPENDENT_AMBULATORY_CARE_PROVIDER_SITE_OTHER): Payer: BC Managed Care – PPO | Admitting: Adult Health

## 2014-01-07 VITALS — BP 117/72 | HR 82 | Temp 98.5°F | Ht 64.0 in | Wt 289.0 lb

## 2014-01-07 DIAGNOSIS — R51 Headache: Secondary | ICD-10-CM

## 2014-01-07 DIAGNOSIS — R519 Headache, unspecified: Secondary | ICD-10-CM

## 2014-01-07 DIAGNOSIS — G253 Myoclonus: Secondary | ICD-10-CM

## 2014-01-07 NOTE — Progress Notes (Signed)
PATIENT: Emily Gay DOB: 1970/11/23  REASON FOR VISIT: follow up HISTORY FROM: patient  HISTORY OF PRESENT ILLNESS: [name] is a 43 year old female with a history of sleep apnea, myoclonic jerks and headaches. She returns today for follow-up. The patient had an EEG that was unremarkable. She is currently taking lyrica. Patient reports that her headaches has improved. The myclonic jerks have stayed the same. She reports that she has approximately 5 episodes of jerking a week. Her husband states that she was sometimes do before bedtime but the patient is unaware of this. Overall the patient states that she has learned to live with this. She does have a history of obstructive sleep apnea and was using a CPAP machine. She states that she could not tolerate the CPAP Mask therefore she is no longer using the machine. She does state that she would like to have another sleep study with a another physician. The patient states that she has low back pain due to a disk issue. She sees an orthopedist for this. She states that this pain causes weakness and numbness in the lower legs. She has tried conservative therapy without benefit. She is considering surgical options.  HISTORY 10/07/13 ([name]): 43 year old right-handed white female with a history of sleep apnea, and she currently is on CPAP, but she is not tolerating the mask well. The patient has had some issues with headaches as well. She reports episodes of muscle jerks of the last 2 years, that seemed to be better initially with the use of Lyrica. Within the last 2 months, there has been some increase in the number of myoclonic episodes that she is now having. The episodes may oftentimes occur when she is trying to get to sleep at night, and may wake her up. She reports that she is under increased stress as she has been promoted at work. The patient may have episodes of jerking of the head with head turning to the right, or she may have jerking of the  arm. During the episodes, there may be some clouding of consciousness. The patient may have episodes of sudden onset drowsiness, with a need to sleep. She will be seeing her sleep doctor in the near future. She returns to this office following an MRI evaluation of the brain done recently that was unremarkable with exception of some sinus disease. The patient has been set up for an EEG study, but this has not yet been done.  REVIEW OF SYSTEMS: Out of a complete 14 system review of symptoms, the patient complains only of the following symptoms, and all other reviewed systems are negative.    ALLERGIES: No Known Allergies  HOME MEDICATIONS: Outpatient Prescriptions Prior to Visit  Medication Sig Dispense Refill  . acetaminophen (TYLENOL) 500 MG tablet Take 500 mg by mouth every 6 (six) hours as needed.    . celecoxib (CELEBREX) 200 MG capsule Take 1 capsule (200 mg total) by mouth 2 (two) times daily. For pain & inflammation 60 capsule 5  . Cholecalciferol (VITAMIN D PO) Take 5,000 Units by mouth 2 (two) times daily.    . fluticasone (FLONASE) 50 MCG/ACT nasal spray Place 2 sprays into both nostrils at bedtime.    Marland Kitchen levocetirizine (XYZAL) 5 MG tablet TAKE 1 TABLET BY MOUTH DAILY 30 tablet 3  . metFORMIN (GLUCOPHAGE XR) 500 MG 24 hr tablet Take 4 tablets daily as directed for insulin resistance 120 tablet 99  . methocarbamol (ROBAXIN) 500 MG tablet TAKE 1 TABLET (  500 MG TOTAL) BY MOUTH 3 (THREE) TIMES DAILY AS NEEDED FOR MUSCLE SPASMS. 90 tablet 0  . metroNIDAZOLE (METROGEL) 1 % gel Apply topically daily. 45 g 0  . Multiple Vitamin (MULTIVITAMIN) capsule Take 1 capsule by mouth daily.    . Olopatadine HCl (PATADAY OP) Apply to eye daily.    . pantoprazole (PROTONIX) 40 MG tablet TAKE 1 TABLET (40 MG TOTAL) BY MOUTH DAILY. 90 tablet 1  . pregabalin (LYRICA) 75 MG capsule TAKE 1 CAPSULE BY MOUTH 3 TIMES DAILY AND 2 CAPSULES AT NIGHT 150 capsule 3  . traMADol (ULTRAM) 50 MG tablet TAKE 1 TABLET BY  MOUTH THREE TIMES DAILY AS NEEDED 90 tablet 0  . methylPREDNIsolone (MEDROL DOSPACK) 4 MG tablet Take 4 mg by mouth as directed.     No facility-administered medications prior to visit.    PAST MEDICAL HISTORY: Past Medical History  Diagnosis Date  . Allergic rhinitis   . Hypertension   . Obesity   . Cholelithiasis   . Prediabetes   . Chronic low back pain   . OSA on CPAP   . 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 Cholecystectomy      FAMILY HISTORY: Family History  Problem Relation Age of Onset  . Cancer Mother   . Lung disease Mother   . Arthritis Father   . Hypertension Father   . Stroke Father   . Melanoma Sister   . Fibromyalgia Sister   . Mental illness Other     emotional illness (other blood relative)  . Stroke Other     Stroke (parent and grandparent)  . Heart disease Other     (grandparent)  . Hypertension Other     (other blood relative)  . Arthritis Other     Parent and grandparent    SOCIAL HISTORY: History   Social History  . Marital Status: Married    Spouse Name: N/A    Number of Children: N/A  . Years of Education: college   Occupational History  . Manager    Social History Main Topics  . Smoking status: Never Smoker   . Smokeless tobacco: Never Used  . Alcohol Use: Yes     Comment: occasionally  . Drug Use: No  . Sexual Activity: Not on file   Other Topics Concern  . 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      PHYSICAL EXAM  Filed Vitals:   01/07/14 0817  BP: 117/72  Pulse: 82  Temp: 98.5 F (36.9 C)  TempSrc: Oral  Height: $Remove'5\' 4"'bYDWtyu$  (1.626 m)  Weight: 289 lb (131.09 kg)   Body mass index is 49.58 kg/(m^2).  Generalized: Well developed, in no acute  distress, obese  Neurological examination  Mentation: Alert oriented to time, place, history taking. Follows all commands speech and language fluent Cranial nerve II-XII: Pupils were equal round reactive to light. Extraocular movements were full, visual field were full on confrontational test. Facial sensation and strength were normal. Uvula tongue midline. Head turning and shoulder shrug  were normal and symmetric. Motor: The motor testing reveals 5 over 5 strength of all 4 extremities. Good symmetric motor tone is noted throughout.  Sensory: Sensory testing is intact to soft touch on all 4 extremities.  No evidence of extinction is noted.  Coordination: Cerebellar testing reveals good finger-nose-finger and heel-to-shin bilaterally.  Gait and station: Gait is normal. Tandem gait is normal. Romberg is negative but unsteady. No drift is seen.  Reflexes: Deep tendon reflexes are symmetric and normal bilaterally.    DIAGNOSTIC DATA (LABS, IMAGING, TESTING) - I reviewed patient records, labs, notes, testing and imaging myself where available.  Lab Results  Component Value Date   WBC 9.9 11/13/2013   HGB 11.9* 11/13/2013   HCT 35.4* 11/13/2013   MCV 81.4 11/13/2013   PLT 382 11/13/2013      Component Value Date/Time   NA 139 11/13/2013 1627   K 4.1 11/13/2013 1627   CL 104 11/13/2013 1627   CO2 29 11/13/2013 1627   GLUCOSE 81 11/13/2013 1627   BUN 11 11/13/2013 1627   CREATININE 0.62 11/13/2013 1627   CREATININE 0.6 11/30/2011 1618   CALCIUM 8.8 11/13/2013 1627   PROT 6.7 11/13/2013 1627   ALBUMIN 4.0 11/13/2013 1627   AST 15 11/13/2013 1627   ALT 13 11/13/2013 1627   ALKPHOS 80 11/13/2013 1627   BILITOT 0.3 11/13/2013 1627   GFRNONAA >89 11/13/2013 1627   GFRNONAA >60 03/03/2009 0534   GFRAA >89 11/13/2013 1627   GFRAA  03/03/2009 0534    >60        The eGFR has been calculated using the MDRD equation. This calculation has not been validated in all  clinical situations. eGFR's persistently <60 mL/min signify possible Chronic Kidney Disease.   Lab Results  Component Value Date   CHOL 186 11/13/2013   HDL 44 11/13/2013   LDLCALC 115* 11/13/2013   LDLDIRECT 134.4 07/26/2011   TRIG 137 11/13/2013   CHOLHDL 4.2 11/13/2013   Lab Results  Component Value Date   HGBA1C 5.6 11/13/2013   Lab Results  Component Value Date   ZOXWRUEA54 098 11/13/2013   Lab Results  Component Value Date   TSH 2.263 11/13/2013      ASSESSMENT AND PLAN 43 y.o. year old female  has a past medical history of Allergic rhinitis; Hypertension; Obesity; Cholelithiasis; Prediabetes; Chronic low back pain; OSA on CPAP; and Nocturnal myoclonus (10/07/2013). here with:  1. Headache 2. Myoclonic jerks  The patient states that her headache has improved. She is no longer having issues with this. She continues to have daily myoclonic jerks but states that she has learned to live with this. The patient is concerned that she may have fibromyalgia and plans to talk to her primary care provider about this. She is currently taking Lyrica prescribed by her PCP and she is considering increasing the dose to help with her discomfort. Patient will let us know if her symptoms worsen or she develops new symptoms otherwise she will follow up in 6 months or sooner if needed.   Ward Givens, MSN, NP-C 01/07/2014, 8:32 AM [facility] [address] [place], [place] [address] [phone]  Note: This document was prepared with digital dictation and possible smart phrase technology. Any transcriptional errors that result from this process are unintentional.

## 2014-01-07 NOTE — Progress Notes (Signed)
I have read the note, and I agree with the clinical assessment and plan.  Emily Gay   

## 2014-01-07 NOTE — Patient Instructions (Signed)
Pregabalin capsules What is this medicine? PREGABALIN (pre GAB a lin) is used to treat nerve pain from diabetes, shingles, spinal cord injury, and fibromyalgia. It is also used to control seizures in epilepsy. This medicine may be used for other purposes; ask your health care provider or pharmacist if you have questions. COMMON BRAND NAME(S): Lyrica What should I tell my health care provider before I take this medicine? They need to know if you have any of these conditions: -bleeding problems -heart disease, including heart failure -history of alcohol or drug abuse -kidney disease -suicidal thoughts, plans, or attempt; a previous suicide attempt by you or a family member -an unusual or allergic reaction to pregabalin, gabapentin, other medicines, foods, dyes, or preservatives -pregnant or trying to get pregnant or trying to conceive with your partner -breast-feeding How should I use this medicine? Take this medicine by mouth with a glass of water. Follow the directions on the prescription label. You can take this medicine with or without food. Take your doses at regular intervals. Do not take your medicine more often than directed. Do not stop taking except on your doctor's advice. A special MedGuide will be given to you by the pharmacist with each prescription and refill. Be sure to read this information carefully each time. Talk to your pediatrician regarding the use of this medicine in children. Special care may be needed. Overdosage: If you think you have taken too much of this medicine contact a poison control center or emergency room at once. NOTE: This medicine is only for you. Do not share this medicine with others. What if I miss a dose? If you miss a dose, take it as soon as you can. If it is almost time for your next dose, take only that dose. Do not take double or extra doses. What may interact with this medicine? -alcohol -certain medicines for blood pressure like captopril,  enalapril, or lisinopril -certain medicines for diabetes, like pioglitazone or rosiglitazone -certain medicines for anxiety or sleep -narcotic medicines for pain This list may not describe all possible interactions. Give your health care provider a list of all the medicines, herbs, non-prescription drugs, or dietary supplements you use. Also tell them if you smoke, drink alcohol, or use illegal drugs. Some items may interact with your medicine. What should I watch for while using this medicine? Tell your doctor or healthcare professional if your symptoms do not start to get better or if they get worse. Visit your doctor or health care professional for regular checks on your progress. Do not stop taking except on your doctor's advice. You may develop a severe reaction. Your doctor will tell you how much medicine to take. Wear a medical identification bracelet or chain if you are taking this medicine for seizures, and carry a card that describes your disease and details of your medicine and dosage times. You may get drowsy or dizzy. Do not drive, use machinery, or do anything that needs mental alertness until you know how this medicine affects you. Do not stand or sit up quickly, especially if you are an older patient. This reduces the risk of dizzy or fainting spells. Alcohol may interfere with the effect of this medicine. Avoid alcoholic drinks. If you have a heart condition, like congestive heart failure, and notice that you are retaining water and have swelling in your hands or feet, contact your health care provider immediately. The use of this medicine may increase the chance of suicidal thoughts or actions. Pay special attention   to how you are responding while on this medicine. Any worsening of mood, or thoughts of suicide or dying should be reported to your health care professional right away. This medicine has caused reduced sperm counts in some men. This may interfere with the ability to father a  child. You should talk to your doctor or health care professional if you are concerned about your fertility. Women who become pregnant while using this medicine for seizures may enroll in the [place] American Antiepileptic Drug Pregnancy Registry by calling 1-888-233-2334. This registry collects information about the safety of antiepileptic drug use during pregnancy. What side effects may I notice from receiving this medicine? Side effects that you should report to your doctor or health care professional as soon as possible: -allergic reactions like skin rash, itching or hives, swelling of the face, lips, or tongue -breathing problems -changes in vision -chest pain -confusion -jerking or unusual movements of any part of your body -loss of memory -muscle pain, tenderness, or weakness -suicidal thoughts or other mood changes -swelling of the ankles, feet, hands -unusual bruising or bleeding Side effects that usually do not require medical attention (Report these to your doctor or health care professional if they continue or are bothersome.): -dizziness -drowsiness -dry mouth -headache -nausea -tremors -trouble sleeping -weight gain This list may not describe all possible side effects. Call your doctor for medical advice about side effects. You may report side effects to FDA at 1-800-FDA-1088. Where should I keep my medicine? Keep out of the reach of children. This medicine can be abused. Keep your medicine in a safe place to protect it from theft. Do not share this medicine with anyone. Selling or giving away this medicine is dangerous and against the law. Store at room temperature between 15 and 30 degrees C (59 and 86 degrees F). Throw away any unused medicine after the expiration date. NOTE: This sheet is a summary. It may not cover all possible information. If you have questions about this medicine, talk to your doctor, pharmacist, or health care provider.  2015, Elsevier/Gold Standard.  (2010-07-14 20:00:36)  

## 2014-01-13 ENCOUNTER — Other Ambulatory Visit: Payer: Self-pay | Admitting: Physician Assistant

## 2014-01-13 MED ORDER — TRAMADOL HCL 50 MG PO TABS: 50.0000 mg | ORAL_TABLET | Freq: Three times a day (TID) | ORAL | Status: DC | PRN

## 2014-02-03 ENCOUNTER — Other Ambulatory Visit: Payer: Self-pay | Admitting: Internal Medicine

## 2014-02-11 ENCOUNTER — Other Ambulatory Visit: Payer: Self-pay | Admitting: Physician Assistant

## 2014-02-12 ENCOUNTER — Other Ambulatory Visit: Payer: Self-pay | Admitting: Physician Assistant

## 2014-02-12 ENCOUNTER — Other Ambulatory Visit: Payer: Self-pay | Admitting: Internal Medicine

## 2014-02-17 ENCOUNTER — Other Ambulatory Visit: Payer: Self-pay | Admitting: Physician Assistant

## 2014-02-24 ENCOUNTER — Ambulatory Visit: Payer: Self-pay | Admitting: Physician Assistant

## 2014-02-25 ENCOUNTER — Ambulatory Visit (INDEPENDENT_AMBULATORY_CARE_PROVIDER_SITE_OTHER): Payer: BLUE CROSS/BLUE SHIELD | Admitting: Physician Assistant

## 2014-02-25 ENCOUNTER — Encounter: Payer: Self-pay | Admitting: Physician Assistant

## 2014-02-25 VITALS — BP 120/80 | HR 84 | Temp 87.7°F | Resp 16 | Ht 64.0 in | Wt 285.0 lb

## 2014-02-25 DIAGNOSIS — E8881 Metabolic syndrome: Secondary | ICD-10-CM

## 2014-02-25 DIAGNOSIS — R7303 Prediabetes: Secondary | ICD-10-CM

## 2014-02-25 DIAGNOSIS — E782 Mixed hyperlipidemia: Secondary | ICD-10-CM

## 2014-02-25 DIAGNOSIS — Z79899 Other long term (current) drug therapy: Secondary | ICD-10-CM

## 2014-02-25 DIAGNOSIS — E559 Vitamin D deficiency, unspecified: Secondary | ICD-10-CM

## 2014-02-25 DIAGNOSIS — R7309 Other abnormal glucose: Secondary | ICD-10-CM

## 2014-02-25 DIAGNOSIS — I10 Essential (primary) hypertension: Secondary | ICD-10-CM

## 2014-02-25 LAB — CBC WITH DIFFERENTIAL/PLATELET
BASOS ABS: 0 10*3/uL (ref 0.0–0.1)
Basophils Relative: 0 % (ref 0–1)
Eosinophils Absolute: 0.2 10*3/uL (ref 0.0–0.7)
Eosinophils Relative: 2 % (ref 0–5)
HCT: 37.8 % (ref 36.0–46.0)
Hemoglobin: 12.6 g/dL (ref 12.0–15.0)
Lymphocytes Relative: 17 % (ref 12–46)
Lymphs Abs: 1.7 10*3/uL (ref 0.7–4.0)
MCH: 27.5 pg (ref 26.0–34.0)
MCHC: 33.3 g/dL (ref 30.0–36.0)
MCV: 82.4 fL (ref 78.0–100.0)
MPV: 9.8 fL (ref 8.6–12.4)
Monocytes Absolute: 0.7 10*3/uL (ref 0.1–1.0)
Monocytes Relative: 7 % (ref 3–12)
NEUTROS ABS: 7.5 10*3/uL (ref 1.7–7.7)
NEUTROS PCT: 74 % (ref 43–77)
Platelets: 397 10*3/uL (ref 150–400)
RBC: 4.59 MIL/uL (ref 3.87–5.11)
RDW: 14 % (ref 11.5–15.5)
WBC: 10.2 10*3/uL (ref 4.0–10.5)

## 2014-02-25 MED ORDER — BUPROPION HCL ER (XL) 150 MG PO TB24: 150.0000 mg | ORAL_TABLET | ORAL | Status: DC

## 2014-02-25 MED ORDER — PREGABALIN 150 MG PO CAPS: ORAL_CAPSULE | ORAL | Status: DC

## 2014-02-25 MED ORDER — PREDNISONE 20 MG PO TABS: ORAL_TABLET | ORAL | Status: DC

## 2014-02-25 MED ORDER — PHENTERMINE HCL 37.5 MG PO TABS: 37.5000 mg | ORAL_TABLET | Freq: Every day | ORAL | Status: DC

## 2014-02-25 NOTE — Progress Notes (Signed)
Assessment and Plan:  Hypertension: Continue medication, monitor blood pressure at home. Continue DASH diet.  Reminder to go to the ER if any CP, SOB, nausea, dizziness, severe HA, changes vision/speech, left arm numbness and tingling, and jaw pain. Cholesterol: Continue diet and exercise. Check cholesterol.  Pre-diabetes-Continue diet and exercise. Check A1C Vitamin D Def- check level and continue medications.  Depression- will add Wellbutrin, if this does not tolerate will do cymbalta Morbid obesity- will try phentermine, with pending surgery coming up, would be beneficial to have weight loss to decrease complications. Did discuss weight loss surgery, information given to patient.  Chronic pain- will increase lyrica and continue follow up with ortho. Responds well to steroids, follows with Dr. Lendon ColonelHawks, negative inflammation work up- will give steroid taper, patient understands risk/increase weight associated with it but states only way to function at work some time.   Continue diet and meds as discussed. Further disposition pending results of labs. OVER 40 minutes of exam, counseling, chart review, referral performed  HPI 44 y.o. female  presents for 3 month follow up with hypertension, hyperlipidemia, prediabetes and vitamin D.  Her blood pressure has been controlled at home, today their BP is BP: 120/80 mmHg  She does not workout. She denies chest pain, shortness of breath, dizziness.  She is not on cholesterol medication and denies myalgias. Her cholesterol is not at goal. The cholesterol last visit was:   Lab Results  Component Value Date   CHOL 186 11/13/2013   HDL 44 11/13/2013   LDLCALC 115* 11/13/2013   LDLDIRECT 134.4 07/26/2011   TRIG 137 11/13/2013   CHOLHDL 4.2 11/13/2013    She has been working on diet and exercise for prediabetes, and denies paresthesia of the feet, polydipsia, polyuria and visual disturbances. Last A1C in the office was:  Lab Results  Component Value Date    HGBA1C 5.6 11/13/2013  Patient is on Vitamin D supplement.   Lab Results  Component Value Date   VD25OH 54 11/13/2013  Has chronic lower back pain, following with ortho and neuro. She has had increasing problem with pain management, saw Dr. Yevette Edwardsumonski Jan 5th, ESI was not helpful and is considering surgery. States has fallen once due to the pain. She would like to try to increase the lyrica for the pain.  She is on 75mg  3 during the day and 2 at night.  She has OSA, is unable to tolerate CPAP.  She states that she is having problems with depression due to her weight and her chronic pain.  BMI is Body mass index is 48.9 kg/(m^2)., she is working on diet and exercise. Wt Readings from Last 3 Encounters:  02/25/14 285 lb (129.275 kg)  01/07/14 289 lb (131.09 kg)  11/13/13 292 lb (132.45 kg)     Current Medications:  Current Outpatient Prescriptions on File Prior to Visit  Medication Sig Dispense Refill  . acetaminophen (TYLENOL) 500 MG tablet Take 500 mg by mouth every 6 (six) hours as needed.    . celecoxib (CELEBREX) 200 MG capsule TAKE 1 CAPSULE (200 MG TOTAL) BY MOUTH 2 (TWO) TIMES DAILY. FOR PAIN & INFLAMMATION 60 capsule 4  . Cholecalciferol (VITAMIN D PO) Take 5,000 Units by mouth 2 (two) times daily.    . fluticasone (FLONASE) 50 MCG/ACT nasal spray Place 2 sprays into both nostrils at bedtime.    Marland Kitchen. levocetirizine (XYZAL) 5 MG tablet TAKE 1 TABLET BY MOUTH DAILY 30 tablet 2  . LYRICA 75 MG capsule TAKE 1  CAPSULE BY MOUTH 3 TIMES DAILY AND 2 CAPSULES AT NIGHT 150 capsule 3  . methocarbamol (ROBAXIN) 500 MG tablet TAKE 1 TABLET (500 MG TOTAL) BY MOUTH 3 (THREE) TIMES DAILY AS NEEDED FOR MUSCLE SPASMS. 90 tablet 0  . metroNIDAZOLE (METROGEL) 1 % gel Apply topically daily. 45 g 0  . Multiple Vitamin (MULTIVITAMIN) capsule Take 1 capsule by mouth daily.    . pantoprazole (PROTONIX) 40 MG tablet TAKE 1 TABLET (40 MG TOTAL) BY MOUTH DAILY. 90 tablet 0  . traMADol (ULTRAM) 50 MG tablet  TAKE 1 TABLET BY MOUTH THREE TIMES DAILY AS NEEDED 90 tablet 0   No current facility-administered medications on file prior to visit.   Medical History:  Past Medical History  Diagnosis Date  . Allergic rhinitis   . Hypertension   . Obesity   . Cholelithiasis   . Prediabetes   . Chronic low back pain   . OSA on CPAP   . 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   Allergies: No Known Allergies   Review of Systems:  Review of Systems  Constitutional: Positive for malaise/fatigue. Negative for fever, chills, weight loss and diaphoresis.  HENT: Negative for congestion, ear discharge, ear pain, hearing loss, nosebleeds, sore throat and tinnitus.   Eyes: Negative.   Respiratory: Negative.  Negative for stridor.   Cardiovascular: Negative.   Gastrointestinal: Positive for diarrhea and constipation. Negative for heartburn, nausea, vomiting, abdominal pain, blood in stool and melena.  Genitourinary: Negative.   Musculoskeletal: Positive for myalgias, back pain, joint pain and falls. Negative for neck pain.  Skin: Negative.   Neurological: Positive for dizziness, sensory change, focal weakness and headaches. Negative for tingling, tremors, speech change, seizures, loss of consciousness and weakness.  Psychiatric/Behavioral: Positive for depression. Negative for suicidal ideas, hallucinations, memory loss and substance abuse. The patient has insomnia. The patient is not nervous/anxious.     Family history- Review and unchanged Social history- Review and unchanged Physical Exam: BP 120/80 mmHg  Pulse 84  Temp(Src) 87.7 F (30.9 C)  Resp 16  Ht  (1.626 m)  Wt 285 lb (129.275 kg)  BMI 48.90 kg/m2 Wt Readings from Last 3 Encounters:  02/25/14 285 lb (129.275 kg)  01/07/14 289 lb (131.09 kg)  11/13/13 292 lb (132.45 kg)   General Appearance: Well nourished, in no apparent distress. Eyes: PERRLA, EOMs, conjunctiva no swelling or erythema Sinuses: No Frontal/maxillary tenderness ENT/Mouth:  Ext aud canals clear, TMs without erythema, bulging. No erythema, swelling, or exudate on post pharynx.  Tonsils not swollen or erythematous. Hearing normal.  Neck: Supple, thyroid normal.  Respiratory: Respiratory effort normal, BS equal bilaterally without rales, rhonchi, wheezing or stridor.  Cardio: RRR with no MRGs. Brisk peripheral pulses with 1+ edema.  Abdomen: Soft, + BS, obese,  Non tender, no guarding, rebound, hernias, masses. Lymphatics: Non tender without lymphadenopathy.  Musculoskeletal: Full ROM, 5/5 strength, Antalgic gait, pain with change in position Skin: Warm, dry without rashes, lesions, ecchymosis.  Neuro: Cranial nerves intact. Normal muscle tone, no cerebellar symptoms. Psych: Awake and oriented X 3, normal affect, Insight and Judgment appropriate.    Quentin Mulling, PA-C 10:10 AM [facility]

## 2014-02-25 NOTE — Patient Instructions (Signed)
Phentermine  While taking the medication we may ask that you come into the office once a month or once every 2-3 months to monitor your weight, blood pressure, and heart rate. In addition we can help answer your questions about diet, exercise, and help you every step of the way with your weight loss journey. Sometime it is helpful if you bring in a food diary or use an app on your phone such as myfitnesspal to record your calorie intake, especially in the beginning.   You can start out on 1/3 to 1/2 a pill in the morning and if you are tolerating it well you can increase to one pill daily. I also have some patients that take 1/3 or 1/2 at lunch to help prevent night time eating.  This medication is cheapest CASH pay at [facility] OR [facility] and you do NOT need a membership to get meds from there.    What is this medicine? PHENTERMINE (FEN ter meen) decreases your appetite. This medicine is intended to be used in addition to a healthy reduced calorie diet and exercise. The best results are achieved this way. This medicine is only indicated for short-term use. Eventually your weight loss may level out and the medication will no longer be needed.   How should I use this medicine? Take this medicine by mouth. Follow the directions on the prescription label. The tablets should stay in the bottle until immediately before you take your dose. Take your doses at regular intervals. Do not take your medicine more often than directed.  Overdosage: If you think you have taken too much of this medicine contact a poison control center or emergency room at once. NOTE: This medicine is only for you. Do not share this medicine with others.  What if I miss a dose? If you miss a dose, take it as soon as you can. If it is almost time for your next dose, take only that dose. Do not take double or extra doses. Do not increase or in any way change your dose without consulting your doctor.  What should I watch for while using  this medicine? Notify your physician immediately if you become short of breath while doing your normal activities. Do not take this medicine within 6 hours of bedtime. It can keep you from getting to sleep. Avoid drinks that contain caffeine and try to stick to a regular bedtime every night. Do not stand or sit up quickly, especially if you are an older patient. This reduces the risk of dizzy or fainting spells. Avoid alcoholic drinks.  What side effects may I notice from receiving this medicine? Side effects that you should report to your doctor or health care professional as soon as possible: -chest pain, palpitations -depression or severe changes in mood -increased blood pressure -irritability -nervousness or restlessness -severe dizziness -shortness of breath -problems urinating -unusual swelling of the legs -vomiting  Side effects that usually do not require medical attention (report to your doctor or health care professional if they continue or are bothersome): -blurred vision or other eye problems -changes in sexual ability or desire -constipation or diarrhea -difficulty sleeping -dry mouth or unpleasant taste -headache -nausea This list may not describe all possible side effects. Call your doctor for medical advice about side effects. You may report side effects to FDA at 1-800-FDA-1088.  Before you even begin to attack a weight-loss plan, it pays to remember this: You are not fat. You have fat. Losing weight isn't about blame  or shame; it's simply another achievement to accomplish. Dieting is like any other skill-you have to buckle down and work at it. As long as you act in a smart, reasonable way, you'll ultimately get where you want to be. Here are some weight loss pearls for you.  1. It's Not a Diet. It's a Lifestyle Thinking of a diet as something you're on and suffering through only for the short term doesn't work. To shed weight and keep it off, you need to make permanent  changes to the way you eat. It's OK to indulge occasionally, of course, but if you cut calories temporarily and then revert to your old way of eating, you'll gain back the weight quicker than you can say yo-yo. Use it to lose it. Research shows that one of the best predictors of long-term weight loss is how many pounds you drop in the first month. For that reason, nutritionists often suggest being stricter for the first two weeks of your new eating strategy to build momentum. Cut out added sugar and alcohol and avoid unrefined carbs. After that, figure out how you can reincorporate them in a way that's healthy and maintainable.  2. There's a Right Way to Exercise Working out burns calories and fat and boosts your metabolism by building muscle. But those trying to lose weight are notorious for overestimating the number of calories they burn and underestimating the amount they take in. Unfortunately, your system is biologically programmed to hold on to extra pounds and that means when you start exercising, your body senses the deficit and ramps up its hunger signals. If you're not diligent, you'll eat everything you burn and then some. Use it to lose it. Cardio gets all the exercise glory, but strength and interval training are the real heroes. They help you build lean muscle, which in turn increases your metabolism and calorie-burning ability 3. Don't Overreact to Mild Hunger Some people have a hard time losing weight because of hunger anxiety. To them, being hungry is bad-something to be avoided at all costs-so they carry snacks with them and eat when they don't need to. Others eat because they're stressed out or bored. While you never want to get to the point of being ravenous (that's when bingeing is likely to happen), a hunger pang, a craving, or the fact that it's 3:00 p.m. should not send you racing for the vending machine or obsessing about the energy bar in your purse. Ideally, you should put off eating  until your stomach is growling and it's difficult to concentrate.  Use it to lose it. When you feel the urge to eat, use the HALT method. Ask yourself, Am I really hungry? Or am I angry or anxious, lonely or bored, or tired? If you're still not certain, try the apple test. If you're truly hungry, an apple should seem delicious; if it doesn't, something else is going on. Or you can try drinking water and making yourself busy, if you are still hungry try a healthy snack.  4. Not All Calories Are Created Equal The mechanics of weight loss are pretty simple: Take in fewer calories than you use for energy. But the kind of food you eat makes all the difference. Processed food that's high in saturated fat and refined starch or sugar can cause inflammation that disrupts the hormone signals that tell your brain you're full. The result: You eat a lot more.  Use it to lose it. Clean up your diet. Swap in whole, unprocessed  foods, including vegetables, lean protein, and healthy fats that will fill you up and give you the biggest nutritional bang for your calorie buck. In a few weeks, as your brain starts receiving regular hunger and fullness signals once again, you'll notice that you feel less hungry overall and naturally start cutting back on the amount you eat.  5. Protein, Produce, and Plant-Based Fats Are Your Weight-Loss Trinity Here's why eating the three Ps regularly will help you drop pounds. Protein fills you up. You need it to build lean muscle, which keeps your metabolism humming so that you can torch more fat. People in a weight-loss program who ate double the recommended daily allowance for protein (about 110 grams for a 150-pound woman) lost 70 percent of their weight from fat, while people who ate the RDA lost only about 40 percent, one study found. Produce is packed with filling fiber. "It's very difficult to consume too many calories if you're eating a lot of vegetables. Example: Three cups of broccoli  is a lot of food, yet only 93 calories. (Fruit is another story. It can be easy to overeat and can contain a lot of calories from sugar, so be sure to monitor your intake.) Plant-based fats like olive oil and those in avocados and nuts are healthy and extra satiating.  Use it to lose it. Aim to incorporate each of the three Ps into every meal and snack. People who eat protein throughout the day are able to keep weight off, according to a study in the American Journal of Clinical Nutrition. In addition to meat, poultry and seafood, good sources are beans, lentils, eggs, tofu, and yogurt. As for fat, keep portion sizes in check by measuring out salad dressing, oil, and nut butters (shoot for one to two tablespoons). Finally, eat veggies or a little fruit at every meal. People who did that consumed 308 fewer calories but didn't feel any hungrier than when they didn't eat more produce.  7. How You Eat Is As Important As What You Eat In order for your brain to register that you're full, you need to focus on what you're eating. Sit down whenever you eat, preferably at a table. Turn off the TV or computer, put down your phone, and look at your food. Smell it. Chew slowly, and don't put another bite on your fork until you swallow. When women ate lunch this attentively, they consumed 30 percent less when snacking later than those who listened to an audiobook at lunchtime, according to a study in the British Journal of Nutrition. 8. We[place]ighing Yourself Really Works The scale provides the best evidence about whether your efforts are paying off. Seeing the numbers tick up or down or stagnate is motivation to keep going-or to rethink your approach. A 2015 study at [facility] found that daily weigh-ins helped people lose more weight, keep it off, and maintain that loss, even after two years. Use it to lose it. Step on the scale at the same time every day for the best results. If your weight shoots up several pounds  from one weigh-in to the next, don't freak out. Eating a lot of salt the night before or having your period is the likely culprit. The number should return to normal in a day or two. It's a steady climb that you need to do something about. 9. Too Much Stress and Too Little Sleep Are Your Enemies When you're tired and frazzled, your body cranks up the production of cortisol, the  stress hormone that can cause carb cravings. Not getting enough sleep also boosts your levels of ghrelin, a hormone associated with hunger, while suppressing leptin, a hormone that signals fullness and satiety. People on a diet who slept only five and a half hours a night for two weeks lost 55 percent less fat and were hungrier than those who slept eight and a half hours, according to a study in the [place] [employer] Journal. Use it to lose it. Prioritize sleep, aiming for seven hours or more a night, which research shows helps lower stress. And make sure you're getting quality zzz's. If a snoring spouse or a fidgety cat wakes you up frequently throughout the night, you may end up getting the equivalent of just four hours of sleep, according to a study from [facility]. Keep pets out of the bedroom, and use a white-noise app to drown out snoring. 10. You Will Hit a plateau-And You Can Bust Through It As you slim down, your body releases much less leptin, the fullness hormone.  If you're not strength training, start right now. Building muscle can raise your metabolism to help you overcome a plateau. To keep your body challenged and burning calories, incorporate new moves and more intense intervals into your workouts or add another sweat session to your weekly routine. Alternatively, cut an extra 100 calories or so a day from your diet. Now that you've lost weight, your body simply doesn't need as much fuel.   Here is a number for you to call if you are considering bariatric surgery as a treatment for your obesity.  This is an effective treatment for weight loss however it too is not a simple fix. The surgery can have complications during and after, and on average patients gain back the weight 10-15 years after the surgery if they do not change their eating habits. Please call [phone] to learn more and to register for a seminar.

## 2014-02-26 LAB — HEPATIC FUNCTION PANEL
ALT: 9 U/L (ref 0–35)
AST: 12 U/L (ref 0–37)
Albumin: 4.3 g/dL (ref 3.5–5.2)
Alkaline Phosphatase: 76 U/L (ref 39–117)
Bilirubin, Direct: 0.1 mg/dL (ref 0.0–0.3)
Indirect Bilirubin: 0.4 mg/dL (ref 0.2–1.2)
Total Bilirubin: 0.5 mg/dL (ref 0.2–1.2)
Total Protein: 7.3 g/dL (ref 6.0–8.3)

## 2014-02-26 LAB — BASIC METABOLIC PANEL WITH GFR
BUN: 11 mg/dL (ref 6–23)
CALCIUM: 9.9 mg/dL (ref 8.4–10.5)
CHLORIDE: 102 meq/L (ref 96–112)
CO2: 27 mEq/L (ref 19–32)
Creat: 0.54 mg/dL (ref 0.50–1.10)
Glucose, Bld: 84 mg/dL (ref 70–99)
Potassium: 4.5 mEq/L (ref 3.5–5.3)
Sodium: 138 mEq/L (ref 135–145)

## 2014-02-26 LAB — LIPID PANEL
Cholesterol: 188 mg/dL (ref 0–200)
HDL: 49 mg/dL (ref >39)
LDL CALC: 114 mg/dL — AB (ref 0–99)
Total CHOL/HDL Ratio: 3.8 Ratio
Triglycerides: 126 mg/dL (ref <150)
VLDL: 25 mg/dL (ref 0–40)

## 2014-02-26 LAB — INSULIN, FASTING: Insulin fasting, serum: 12.8 u[IU]/mL (ref 2.0–19.6)

## 2014-02-26 LAB — MAGNESIUM: Magnesium: 2 mg/dL (ref 1.5–2.5)

## 2014-02-26 LAB — TSH: TSH: 2.604 u[IU]/mL (ref 0.350–4.500)

## 2014-02-26 LAB — VITAMIN D 25 HYDROXY (VIT D DEFICIENCY, FRACTURES): Vit D, 25-Hydroxy: 25 ng/mL — ABNORMAL LOW (ref 30–100)

## 2014-03-18 ENCOUNTER — Telehealth: Payer: Self-pay | Admitting: Physician Assistant

## 2014-03-18 ENCOUNTER — Encounter: Payer: Self-pay | Admitting: Physician Assistant

## 2014-03-18 MED ORDER — CELECOXIB 200 MG PO CAPS: ORAL_CAPSULE | ORAL | Status: DC

## 2014-03-18 MED ORDER — TRAMADOL HCL 50 MG PO TABS: 50.0000 mg | ORAL_TABLET | Freq: Three times a day (TID) | ORAL | Status: DC | PRN

## 2014-03-25 ENCOUNTER — Other Ambulatory Visit: Payer: Self-pay | Admitting: Internal Medicine

## 2014-04-06 ENCOUNTER — Other Ambulatory Visit: Payer: Self-pay | Admitting: Physician Assistant

## 2014-04-08 ENCOUNTER — Ambulatory Visit: Payer: Self-pay | Admitting: Physician Assistant

## 2014-04-16 ENCOUNTER — Other Ambulatory Visit: Payer: Self-pay | Admitting: Physician Assistant

## 2014-04-27 ENCOUNTER — Encounter: Payer: Self-pay | Admitting: Physician Assistant

## 2014-04-27 ENCOUNTER — Ambulatory Visit (INDEPENDENT_AMBULATORY_CARE_PROVIDER_SITE_OTHER): Payer: BLUE CROSS/BLUE SHIELD | Admitting: Physician Assistant

## 2014-04-27 DIAGNOSIS — G8929 Other chronic pain: Secondary | ICD-10-CM

## 2014-04-27 DIAGNOSIS — G542 Cervical root disorders, not elsewhere classified: Secondary | ICD-10-CM

## 2014-04-27 DIAGNOSIS — F329 Major depressive disorder, single episode, unspecified: Secondary | ICD-10-CM

## 2014-04-27 DIAGNOSIS — M5416 Radiculopathy, lumbar region: Secondary | ICD-10-CM

## 2014-04-27 DIAGNOSIS — F32A Depression, unspecified: Secondary | ICD-10-CM

## 2014-04-27 MED ORDER — BUPROPION HCL ER (XL) 300 MG PO TB24: 300.0000 mg | ORAL_TABLET | ORAL | Status: DC

## 2014-04-27 MED ORDER — METHYLPREDNISOLONE 4 MG PO KIT: PACK | ORAL | Status: DC

## 2014-04-27 NOTE — Progress Notes (Signed)
Assessment and Plan: 1. Morbid obesity (BMI 50) Obesity with co morbidities- long discussion about weight loss, diet, and exercise, will start the patient on phentermine- hand out given and AE's discussed, will do close follow up.   2. Cervical nerve root impingement Impingement Syndrome- get cervical neck pillow, given exercises,  was not ordered cervical neck xray, suggest PT, follow up in the office if not better.   3. Depression - buPROPion (WELLBUTRIN XL) 300 MG 24 hr tablet; Take 1 tablet (300 mg total) by mouth every morning.  Dispense: 30 tablet; Refill: 2  4. Chronic radicular lumbar pain Follow up with ortho, informed patient if she wants a refill of Norco to follow up with Ortho - methylPREDNISolone (MEDROL DOSEPAK) 4 MG tablet; follow package directions  Dispense: 21 tablet; Refill: 0  Future Appointments Date Time Provider Department Center  11/18/2014 2:00 PM Quentin MullingAmanda Collier, PA-C [id number] None    HPI 44 y.o.female presents for follow up from OV in Feb. She was started on wellbutrin and phentermine. Her mother in law passed recently so she has not been able to have as much weight loss with being at the hospital with her dad. She tried the phentermine but went right up to 1 pill and could not tolerate it due to nausea. She states the wellbutrin is helping and she would like to go up. She is having severe leg pain and hip pain from her back, surgery has been postponed until the summer and she is requesting another prednisone.  BMI is Body mass index is 48.55 kg/(m^2)., she is working on diet and exercise. Wt Readings from Last 3 Encounters:  04/27/14 283 lb (128.368 kg)  02/25/14 285 lb (129.275 kg)  01/07/14 289 lb (131.09 kg)   She has bilateral hand numbness when she wakes up, worse with lying on that side.   Past Medical History  Diagnosis Date  . Allergic rhinitis   . Hypertension   . Obesity   . Cholelithiasis   . Prediabetes   . Chronic low back pain   . OSA  on CPAP   . 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 acetaminophen (TYLENOL) 500 MG tablet Take 500 mg by mouth every 6 (six) hours as needed.    Marland Kitchen. buPROPion (WELLBUTRIN XL) 150 MG 24 hr tablet TAKE 1 TABLET BY MOUTH DAILY IN THE MORNING 30 tablet 1  . celecoxib (CELEBREX) 200 MG capsule TAKE 1 CAPSULE (200 MG TOTAL) BY MOUTH 2 (TWO) TIMES DAILY. FOR PAIN & INFLAMMATION 60 capsule 4  . Cholecalciferol (VITAMIN D PO) Take 5,000 Units by mouth 2 (two) times daily.    . fluticasone (FLONASE) 50 MCG/ACT nasal spray Place 2 sprays into both nostrils at bedtime.    Marland Kitchen. HYDROCODONE-ACETAMINOPHEN PO Take by mouth as needed.    Marland Kitchen. levocetirizine (XYZAL) 5 MG tablet TAKE 1 TABLET BY MOUTH DAILY 30 tablet 2  . methocarbamol (ROBAXIN) 500 MG tablet TAKE 1 TABLET (500 MG TOTAL) BY MOUTH 3 (THREE) TIMES DAILY AS NEEDED FOR MUSCLE SPASMS. 90 tablet 0  . metroNIDAZOLE (METROGEL) 1 % gel Apply topically daily. 45 g 0  . Multiple Vitamin (MULTIVITAMIN) capsule Take 1 capsule by mouth daily.    . pantoprazole (PROTONIX) 40 MG tablet TAKE 1 TABLET (40 MG TOTAL) BY MOUTH DAILY. 90 tablet 0  . pregabalin (LYRICA) 150 MG capsule TAKE 1 CAPSULE  BY MOUTH 3- 4 TIMES A DAY 120 capsule 3  . traMADol (ULTRAM) 50 MG tablet Take 1 tablet (50 mg total) by mouth 3 (three) times daily as needed. 90 tablet 0  . phentermine (ADIPEX-P) 37.5 MG tablet Take 1 tablet (37.5 mg total) by mouth daily before breakfast. (Patient not taking: Reported on 04/27/2014) 30 tablet 0   No current facility-administered medications on file prior to visit.    ROS: all negative except above.   Physical Exam: Filed Weights   04/27/14 1538  Weight: 283 lb (128.368 kg)   BP 124/80 mmHg  Pulse 90  Temp(Src) 98 F (36.7 C) (Temporal)  Resp 18  Ht  (1.626 m)  Wt 283 lb (128.368 kg)  BMI 48.55 kg/m2  LMP 04/06/2014 General  Appearance: Well nourished, in no apparent distress. Eyes: PERRLA, EOMs, conjunctiva no swelling or erythema Sinuses: No Frontal/maxillary tenderness ENT/Mouth: Ext aud canals clear, TMs without erythema, bulging. No erythema, swelling, or exudate on post pharynx.  Tonsils not swollen or erythematous. Hearing normal.  Neck: Supple, thyroid normal.  Respiratory: Respiratory effort normal, BS equal bilaterally without rales, rhonchi, wheezing or stridor.  Cardio: RRR with no MRGs. Brisk peripheral pulses with 1+ edema.  Abdomen: Soft, + BS, obese,  Non tender, no guarding, rebound, hernias, masses. Lymphatics: Non tender without lymphadenopathy.  Musculoskeletal: Full ROM, 5/5 strength, Antalgic gait, pain with change in position Skin: Warm, dry without rashes, lesions, ecchymosis.  Neuro: Cranial nerves intact. Normal muscle tone, no cerebellar symptoms. Psych: Awake and oriented X 3, normal affect, Insight and Judgment appropriate.   Quentin Mulling, PA-C 3:59 PM [facility]

## 2014-04-27 NOTE — Patient Instructions (Addendum)
Phentermine  While taking the medication we may ask that you come into the office once a month or once every 2-3 months to monitor your weight, blood pressure, and heart rate. In addition we can help answer your questions about diet, exercise, and help you every step of the way with your weight loss journey. Sometime it is helpful if you bring in a food diary or use an app on your phone such as myfitnesspal to record your calorie intake, especially in the beginning.   You can start out on 1/3 to 1/2 a pill in the morning and if you are tolerating it well you can increase to one pill daily. I also have some patients that take 1/3 or 1/2 at lunch to help prevent night time eating.  This medication is cheapest CASH pay at [facility] OR [facility] OR [employer] is 14-17 dollars and you do NOT need a membership to get meds from there.    What is this medicine? PHENTERMINE (FEN ter meen) decreases your appetite. This medicine is intended to be used in addition to a healthy reduced calorie diet and exercise. The best results are achieved this way. This medicine is only indicated for short-term use. Eventually your weight loss may level out and the medication will no longer be needed.   How should I use this medicine? Take this medicine by mouth. Follow the directions on the prescription label. The tablets should stay in the bottle until immediately before you take your dose. Take your doses at regular intervals. Do not take your medicine more often than directed.  Overdosage: If you think you have taken too much of this medicine contact a poison control center or emergency room at once. NOTE: This medicine is only for you. Do not share this medicine with others.  What if I miss a dose? If you miss a dose, take it as soon as you can. If it is almost time for your next dose, take only that dose. Do not take double or extra doses. Do not increase or in any way change your dose without consulting your  doctor.  What should I watch for while using this medicine? Notify your physician immediately if you become short of breath while doing your normal activities. Do not take this medicine within 6 hours of bedtime. It can keep you from getting to sleep. Avoid drinks that contain caffeine and try to stick to a regular bedtime every night. Do not stand or sit up quickly, especially if you are an older patient. This reduces the risk of dizzy or fainting spells. Avoid alcoholic drinks.  What side effects may I notice from receiving this medicine? Side effects that you should report to your doctor or health care professional as soon as possible: -chest pain, palpitations -depression or severe changes in mood -increased blood pressure -irritability -nervousness or restlessness -severe dizziness -shortness of breath -problems urinating -unusual swelling of the legs -vomiting  Side effects that usually do not require medical attention (report to your doctor or health care professional if they continue or are bothersome): -blurred vision or other eye problems -changes in sexual ability or desire -constipation or diarrhea -difficulty sleeping -dry mouth or unpleasant taste -headache -nausea This list may not describe all possible side effects. Call your doctor for medical advice about side effects. You may report side effects to FDA at 1-800-FDA-1088.  Cervical Radiculopathy Cervical radiculopathy happens when a nerve in the neck is pinched or bruised by a slipped (herniated) disk  or by arthritic changes in the bones of the cervical spine. This can occur due to an injury or as part of the normal aging process. Pressure on the cervical nerves can cause pain or numbness that runs from your neck all the way down into your arm and fingers. CAUSES  There are many possible causes, including:  Injury.  Muscle tightness in the neck from overuse.  Swollen, painful joints (arthritis).  Breakdown or  degeneration in the bones and joints of the spine (spondylosis) due to aging.  Bone spurs that may develop near the cervical nerves. SYMPTOMS  Symptoms include pain, weakness, or numbness in the affected arm and hand. Pain can be severe or irritating. Symptoms may be worse when extending or turning the neck. DIAGNOSIS  Your caregiver will ask about your symptoms and do a physical exam. He or she may test your strength and reflexes. X-rays, CT scans, and MRI scans may be needed in cases of injury or if the symptoms do not go away after a period of time. Electromyography (EMG) or nerve conduction testing may be done to study how your nerves and muscles are working. TREATMENT  Your caregiver may recommend certain exercises to help relieve your symptoms. Cervical radiculopathy can, and often does, get better with time and treatment. If your problems continue, treatment options may include:  Wearing a soft collar for short periods of time.  Physical therapy to strengthen the neck muscles.  Medicines, such as nonsteroidal anti-inflammatory drugs (NSAIDs), oral corticosteroids, or spinal injections.  Surgery. Different types of surgery may be done depending on the cause of your problems. HOME CARE INSTRUCTIONS   Put ice on the affected area.  Put ice in a plastic bag.  Place a towel between your skin and the bag.  Leave the ice on for 15-20 minutes, 03-04 times a day or as directed by your caregiver.  If ice does not help, you can try using heat. Take a warm shower or bath, or use a hot water bottle as directed by your caregiver.  You may try a gentle neck and shoulder massage.  Use a flat pillow when you sleep.  Only take over-the-counter or prescription medicines for pain, discomfort, or fever as directed by your caregiver.  If physical therapy was prescribed, follow your caregiver's directions.  If a soft collar was prescribed, use it as directed. SEEK IMMEDIATE MEDICAL CARE IF:    Your pain gets much worse and cannot be controlled with medicines.  You have weakness or numbness in your hand, arm, face, or leg.  You have a high fever or a stiff, rigid neck.  You lose bowel or bladder control (incontinence).  You have trouble with walking, balance, or speaking. MAKE SURE YOU:   Understand these instructions.  Will watch your condition.  Will get help right away if you are not doing well or get worse. Document Released: 10/04/2000 Document Revised: 04/03/2011 Document Reviewed: 08/23/2010 [facility] Patient Information 2015 [employer] [place]. This information is not intended to replace advice given to you by your health care provider. Make sure you discuss any questions you have with your health care provider.

## 2014-05-15 ENCOUNTER — Other Ambulatory Visit: Payer: Self-pay | Admitting: Physician Assistant

## 2014-05-15 MED ORDER — TRAMADOL HCL 50 MG PO TABS: 50.0000 mg | ORAL_TABLET | Freq: Three times a day (TID) | ORAL | Status: DC | PRN

## 2014-05-19 ENCOUNTER — Ambulatory Visit: Payer: Self-pay | Admitting: Physician Assistant

## 2014-06-16 ENCOUNTER — Other Ambulatory Visit: Payer: Self-pay | Admitting: Internal Medicine

## 2014-06-16 ENCOUNTER — Encounter: Payer: Self-pay | Admitting: Physician Assistant

## 2014-06-16 ENCOUNTER — Other Ambulatory Visit: Payer: Self-pay | Admitting: Physician Assistant

## 2014-06-17 ENCOUNTER — Encounter: Payer: Self-pay | Admitting: Physician Assistant

## 2014-06-17 ENCOUNTER — Other Ambulatory Visit: Payer: Self-pay | Admitting: Physician Assistant

## 2014-06-26 ENCOUNTER — Ambulatory Visit (INDEPENDENT_AMBULATORY_CARE_PROVIDER_SITE_OTHER): Payer: BLUE CROSS/BLUE SHIELD | Admitting: Physician Assistant

## 2014-06-26 ENCOUNTER — Encounter: Payer: Self-pay | Admitting: Physician Assistant

## 2014-06-26 DIAGNOSIS — E559 Vitamin D deficiency, unspecified: Secondary | ICD-10-CM

## 2014-06-26 DIAGNOSIS — R7309 Other abnormal glucose: Secondary | ICD-10-CM

## 2014-06-26 DIAGNOSIS — Z79899 Other long term (current) drug therapy: Secondary | ICD-10-CM

## 2014-06-26 DIAGNOSIS — I10 Essential (primary) hypertension: Secondary | ICD-10-CM

## 2014-06-26 DIAGNOSIS — R7303 Prediabetes: Secondary | ICD-10-CM

## 2014-06-26 DIAGNOSIS — F32A Depression, unspecified: Secondary | ICD-10-CM

## 2014-06-26 DIAGNOSIS — F329 Major depressive disorder, single episode, unspecified: Secondary | ICD-10-CM

## 2014-06-26 DIAGNOSIS — E782 Mixed hyperlipidemia: Secondary | ICD-10-CM

## 2014-06-26 MED ORDER — TRAMADOL HCL 50 MG PO TABS: 50.0000 mg | ORAL_TABLET | Freq: Three times a day (TID) | ORAL | Status: DC | PRN

## 2014-06-26 NOTE — Patient Instructions (Signed)
Before you even begin to attack a weight-loss plan, it pays to remember this: You are not fat. You have fat. Losing weight isn't about blame or shame; it's simply another achievement to accomplish. Dieting is like any other skill-you have to buckle down and work at it. As long as you act in a smart, reasonable way, you'll ultimately get where you want to be. Here are some weight loss pearls for you.  1. It's Not a Diet. It's a Lifestyle Thinking of a diet as something you're on and suffering through only for the short term doesn't work. To shed weight and keep it off, you need to make permanent changes to the way you eat. It's OK to indulge occasionally, of course, but if you cut calories temporarily and then revert to your old way of eating, you'll gain back the weight quicker than you can say yo-yo. Use it to lose it. Research shows that one of the best predictors of long-term weight loss is how many pounds you drop in the first month. For that reason, nutritionists often suggest being stricter for the first two weeks of your new eating strategy to build momentum. Cut out added sugar and alcohol and avoid unrefined carbs. After that, figure out how you can reincorporate them in a way that's healthy and maintainable.  2. There's a Right Way to Exercise Working out burns calories and fat and boosts your metabolism by building muscle. But those trying to lose weight are notorious for overestimating the number of calories they burn and underestimating the amount they take in. Unfortunately, your system is biologically programmed to hold on to extra pounds and that means when you start exercising, your body senses the deficit and ramps up its hunger signals. If you're not diligent, you'll eat everything you burn and then some. Use it to lose it. Cardio gets all the exercise glory, but strength and interval training are the real heroes. They help you build lean muscle, which in turn increases your metabolism and  calorie-burning ability 3. Don't Overreact to Mild Hunger Some people have a hard time losing weight because of hunger anxiety. To them, being hungry is bad-something to be avoided at all costs-so they carry snacks with them and eat when they don't need to. Others eat because they're stressed out or bored. While you never want to get to the point of being ravenous (that's when bingeing is likely to happen), a hunger pang, a craving, or the fact that it's 3:00 p.m. should not send you racing for the vending machine or obsessing about the energy bar in your purse. Ideally, you should put off eating until your stomach is growling and it's difficult to concentrate.  Use it to lose it. When you feel the urge to eat, use the HALT method. Ask yourself, Am I really hungry? Or am I angry or anxious, lonely or bored, or tired? If you're still not certain, try the apple test. If you're truly hungry, an apple should seem delicious; if it doesn't, something else is going on. Or you can try drinking water and making yourself busy, if you are still hungry try a healthy snack.  4. Not All Calories Are Created Equal The mechanics of weight loss are pretty simple: Take in fewer calories than you use for energy. But the kind of food you eat makes all the difference. Processed food that's high in saturated fat and refined starch or sugar can cause inflammation that disrupts the hormone signals that tell   your brain you're full. The result: You eat a lot more.  Use it to lose it. Clean up your diet. Swap in whole, unprocessed foods, including vegetables, lean protein, and healthy fats that will fill you up and give you the biggest nutritional bang for your calorie buck. In a few weeks, as your brain starts receiving regular hunger and fullness signals once again, you'll notice that you feel less hungry overall and naturally start cutting back on the amount you eat.  5. Protein, Produce, and Plant-Based Fats Are Your Weight-Loss  Trinity Here's why eating the three Ps regularly will help you drop pounds. Protein fills you up. You need it to build lean muscle, which keeps your metabolism humming so that you can torch more fat. People in a weight-loss program who ate double the recommended daily allowance for protein (about 110 grams for a 150-pound woman) lost 70 percent of their weight from fat, while people who ate the RDA lost only about 40 percent, one study found. Produce is packed with filling fiber. "It's very difficult to consume too many calories if you're eating a lot of vegetables. Example: Three cups of broccoli is a lot of food, yet only 93 calories. (Fruit is another story. It can be easy to overeat and can contain a lot of calories from sugar, so be sure to monitor your intake.) Plant-based fats like olive oil and those in avocados and nuts are healthy and extra satiating.  Use it to lose it. Aim to incorporate each of the three Ps into every meal and snack. People who eat protein throughout the day are able to keep weight off, according to a study in the American Journal of Clinical Nutrition. In addition to meat, poultry and seafood, good sources are beans, lentils, eggs, tofu, and yogurt. As for fat, keep portion sizes in check by measuring out salad dressing, oil, and nut butters (shoot for one to two tablespoons). Finally, eat veggies or a little fruit at every meal. People who did that consumed 308 fewer calories but didn't feel any hungrier than when they didn't eat more produce.  7. How You Eat Is As Important As What You Eat In order for your brain to register that you're full, you need to focus on what you're eating. Sit down whenever you eat, preferably at a table. Turn off the TV or computer, put down your phone, and look at your food. Smell it. Chew slowly, and don't put another bite on your fork until you swallow. When women ate lunch this attentively, they consumed 30 percent less when snacking later than  those who listened to an audiobook at lunchtime, according to a study in the British Journal of Nutrition. 8. Weighing Yourself Really Works The scale provides the best evidence about whether your efforts are paying off. Seeing the numbers tick up or down or stagnate is motivation to keep going-or to rethink your approach. A 2015 study at [employer] found that daily weigh-ins helped people lose more weight, keep it off, and maintain that loss, even after two years. Use it to lose it. Step on the scale at the same time every day for the best results. If your weight shoots up several pounds from one weigh-in to the next, don't freak out. Eating a lot of salt the night before or having your period is the likely culprit. The number should return to normal in a day or two. It's a steady climb that you need to do something about.   9. Too Much Stress and Too Little Sleep Are Your Enemies When you're tired and frazzled, your body cranks up the production of cortisol, the stress hormone that can cause carb cravings. Not getting enough sleep also boosts your levels of ghrelin, a hormone associated with hunger, while suppressing leptin, a hormone that signals fullness and satiety. People on a diet who slept only five and a half hours a night for two weeks lost 55 percent less fat and were hungrier than those who slept eight and a half hours, according to a study in the [employer] Association Journal. Use it to lose it. Prioritize sleep, aiming for seven hours or more a night, which research shows helps lower stress. And make sure you're getting quality zzz's. If a snoring spouse or a fidgety cat wakes you up frequently throughout the night, you may end up getting the equivalent of just four hours of sleep, according to a study from [employer]. Keep pets out of the bedroom, and use a white-noise app to drown out snoring. 10. You Will Hit a plateau-And You Can Bust Through It As you slim down, your  body releases much less leptin, the fullness hormone.  If you're not strength training, start right now. Building muscle can raise your metabolism to help you overcome a plateau. To keep your body challenged and burning calories, incorporate new moves and more intense intervals into your workouts or add another sweat session to your weekly routine. Alternatively, cut an extra 100 calories or so a day from your diet. Now that you've lost weight, your body simply doesn't need as much fuel.   Ways to cut 100 calories  1. Eat your eggs with hot sauce OR salsa instead of cheese.  Eggs are great for breakfast, but many people consider eggs and cheese to be BFFs. Instead of cheese-1 oz. of cheddar has 114 calories-top your eggs with hot sauce, which contains no calories and helps with satiety and metabolism. Salsa is also a great option!!  2. Top your toast, waffles or pancakes with mashed berries instead of jelly or syrup. Half a cup of berries-fresh, frozen or thawed-has about 40 calories, compared with 2 tbsp. of maple syrup or jelly, which both have about 100 calories. The berries will also give you a good punch of fiber, which helps keep you full and satisfied and won't spike blood sugar quickly like the jelly or syrup. 3. Swap the non-fat latte for black coffee with a splash of half-and-half. Contrary to its name, that non-fat latte has 130 calories and a startling 19g of carbohydrates per 16 oz. serving. Replacing that 'light' drinkable dessert with a black coffee with a splash of half-and-half saves you more than 100 calories per 16 oz. serving. 4. Sprinkle salads with freeze-dried raspberries instead of dried cranberries. If you want a sweet addition to your nutritious salad, stay away from dried cranberries. They have a whopping 130 calories per  cup and 30g carbohydrates. Instead, sprinkle freeze-dried raspberries guilt-free and save more than 100 calories per  cup serving, adding 3g of belly-filling  fiber. 5. Go for mustard in place of mayo on your sandwich. Mustard can add really nice flavor to any sandwich, and there are tons of varieties, from spicy to honey. A serving of mayo is 95 calories, versus 10 calories in a serving of mustard. 6. Choose a DIY salad dressing instead of the store-bought kind. Mix Dijon or whole grain mustard with low-fat Kefir or red wine vinegar   and garlic. 7. Use hummus as a spread instead of a dip. Use hummus as a spread on a high-fiber cracker or tortilla with a sandwich and save on calories without sacrificing taste. 8. Pick just one salad "accessory." Salad isn't automatically a calorie winner. It's easy to over-accessorize with toppings. Instead of topping your salad with nuts, avocado and cranberries (all three will clock in at 313 calories), just pick one. The next day, choose a different accessory, which will also keep your salad interesting. You don't wear all your jewelry every day, right? 9. Ditch the white pasta in favor of spaghetti squash. One cup of cooked spaghetti squash has about 40 calories, compared with traditional spaghetti, which comes with more than 200. Spaghetti squash is also nutrient-dense. It's a good source of fiber and Vitamins A and C, and it can be eaten just like you would eat pasta-with a great tomato sauce and turkey meatballs or with pesto, tofu and spinach, for example. 10. Dress up your chili, soups and stews with non-fat Greek yogurt instead of sour cream. Just a 'dollop' of sour cream can set you back 115 calories and a whopping 12g of fat-seven of which are of the artery-clogging variety. Added bonus: Greek yogurt is packed with muscle-building protein, calcium and B Vitamins. 11. Mash cauliflower instead of mashed potatoes. One cup of traditional mashed potatoes-in all their creamy goodness-has more than 200 calories, compared to mashed cauliflower, which you can typically eat for less than 100 calories per 1 cup serving.  Cauliflower is a great source of the antioxidant indole-3-carbinol (I3C), which may help reduce the risk of some cancers, like breast cancer. 12. Ditch the ice cream sundae in favor of a Greek yogurt parfait. Instead of a cup of ice cream or fro-yo for dessert, try 1 cup of nonfat Greek yogurt topped with fresh berries and a sprinkle of cacao nibs. Both toppings are packed with antioxidants, which can help reduce cellular inflammation and oxidative damage. And the comparison is a no-brainer: One cup of ice cream has about 275 calories; one cup of frozen yogurt has about 230; and a cup of Greek yogurt has just 130, plus twice the protein, so you're less likely to return to the freezer for a second helping. 13. Put olive oil in a spray container instead of using it directly from the bottle. Each tablespoon of olive oil is 120 calories and 15g of fat. Use a mister instead of pouring it straight into the pan or onto a salad. This allows for portion control and will save you more than 100 calories. 14. When baking, substitute canned pumpkin for butter or oil. Canned pumpkin-not pumpkin pie mix-is loaded with Vitamin A, which is important for skin and eye health, as well as immunity. And the comparisons are pretty crazy:  cup of canned pumpkin has about 40 calories, compared to butter or oil, which has more than 800 calories. Yes, 800 calories. Applesauce and mashed banana can also serve as good substitutions for butter or oil, usually in a 1:1 ratio. 15. Top casseroles with high-fiber cereal instead of breadcrumbs. Breadcrumbs are typically made with white bread, while breakfast cereals contain 5-9g of fiber per serving. Not only will you save more than 150 calories per  cup serving, the swap will also keep you more full and you'll get a metabolism boost from the added fiber. 16. Snack on pistachios instead of macadamia nuts. Believe it or not, you get the same amount of calories from 35   pistachios (100  calories) as you would from only five macadamia nuts. 17. Chow down on kale chips rather than potato chips. This is my favorite 'don't knock it 'till you try it' swap. Kale chips are so easy to make at home, and you can spice them up with a little grated parmesan or chili powder. Plus, they're a mere fraction of the calories of potato chips, but with the same crunch factor we crave so often. 18. Add seltzer and some fruit slices to your cocktail instead of soda or fruit juice. One cup of soda or fruit juice can pack on as much as 140 calories. Instead, use seltzer and fruit slices. The fruit provides valuable phytochemicals, such as flavonoids and anthocyanins, which help to combat cancer and stave off the aging process.  

## 2014-06-26 NOTE — Progress Notes (Signed)
Assessment and Plan:  1. Hypertension -Continue medication, monitor blood pressure at home. Continue DASH diet.  Reminder to go to the ER if any CP, SOB, nausea, dizziness, severe HA, changes vision/speech, left arm numbness and tingling and jaw pain.  2. Cholesterol -Continue diet and exercise. Check cholesterol.   3. Obesity with co morbidities - long discussion about weight loss, diet, and exercise Stop phentermine, discussed tompamax as an option for HA/weight loss  4. Vitamin D Def - check level and continue medications.   5. Chronic pain- TMJ/lower back Tramadol, will refill until surgery.  TMJ and OSA- suggest mouth piece.   Continue diet and meds as discussed. Further disposition pending results of labs. Over 30 minutes of exam, counseling, chart review, and critical decision making was performed  HPI 44 y.o. female  presents for 3 month follow up on hypertension, cholesterol, prediabetes, and vitamin D deficiency.   Her blood pressure has been controlled at home, today their BP is BP: 122/72 mmHg  She does not workout due to chronic pain. She denies chest pain, shortness of breath, dizziness. She follows with ortho for pain, she started prednisone recently for pain, and she is scheduling for surgery in august. She takes tramadol and the hydrocodone is just a rescue med.   She is not on cholesterol medication and denies myalgias. Her cholesterol is at goal. The cholesterol last visit was:   Lab Results  Component Value Date   CHOL 188 02/25/2014   HDL 49 02/25/2014   LDLCALC 114* 02/25/2014   LDLDIRECT 134.4 07/26/2011   TRIG 126 02/25/2014   CHOLHDL 3.8 02/25/2014    Last A1C in the office was:  Lab Results  Component Value Date   HGBA1C 5.6 11/13/2013   BMI is Body mass index is 47.35 kg/(m^2)., she is working on diet and exercise, she is also has phentermine but stopped it due to nausea.  Wt Readings from Last 3 Encounters:  06/26/14 276 lb (125.193 kg)   04/27/14 283 lb (128.368 kg)  02/25/14 285 lb (129.275 kg)   Patient is on Vitamin D supplement.   Lab Results  Component Value Date   VD25OH 25* 02/25/2014      Current Medications:  Current Outpatient Prescriptions on File Prior to Visit  Medication Sig Dispense Refill  . acetaminophen (TYLENOL) 500 MG tablet Take 500 mg by mouth every 6 (six) hours as needed.    Marland Kitchen. buPROPion (WELLBUTRIN XL) 300 MG 24 hr tablet Take 1 tablet (300 mg total) by mouth every morning. 30 tablet 2  . celecoxib (CELEBREX) 200 MG capsule TAKE 1 CAPSULE (200 MG TOTAL) BY MOUTH 2 (TWO) TIMES DAILY. FOR PAIN & INFLAMMATION 60 capsule 4  . Cholecalciferol (VITAMIN D PO) Take 5,000 Units by mouth 2 (two) times daily.    . fluticasone (FLONASE) 50 MCG/ACT nasal spray Place 2 sprays into both nostrils at bedtime.    Marland Kitchen. HYDROCODONE-ACETAMINOPHEN PO Take by mouth as needed.    Marland Kitchen. levocetirizine (XYZAL) 5 MG tablet TAKE 1 TABLET BY MOUTH DAILY 30 tablet 3  . methocarbamol (ROBAXIN) 500 MG tablet TAKE 1 TABLET (500 MG TOTAL) BY MOUTH 3 (THREE) TIMES DAILY AS NEEDED FOR MUSCLE SPASMS. 90 tablet 0  . methylPREDNISolone (MEDROL DOSEPAK) 4 MG tablet follow package directions 21 tablet 0  . metroNIDAZOLE (METROGEL) 1 % gel Apply topically daily. 45 g 0  . Multiple Vitamin (MULTIVITAMIN) capsule Take 1 capsule by mouth daily.    . pantoprazole (PROTONIX) 40 MG  tablet TAKE 1 TABLET (40 MG TOTAL) BY MOUTH DAILY. 90 tablet 0  . phentermine (ADIPEX-P) 37.5 MG tablet Take 1 tablet (37.5 mg total) by mouth daily before breakfast. (Patient not taking: Reported on 04/27/2014) 30 tablet 0  . pregabalin (LYRICA) 150 MG capsule TAKE 1 CAPSULE BY MOUTH 3- 4 TIMES A DAY 120 capsule 3  . traMADol (ULTRAM) 50 MG tablet Take 1 tablet (50 mg total) by mouth 3 (three) times daily as needed. 90 tablet 0   No current facility-administered medications on file prior to visit.   Medical History:  Past Medical History  Diagnosis Date  . Allergic  rhinitis   . Hypertension   . Obesity   . Cholelithiasis   . Prediabetes   . Chronic low back pain   . OSA on CPAP   . 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   HENT: Negative for congestion, ear discharge, ear pain, hearing loss, nosebleeds, sore throat and tinnitus.   Eyes: Negative.   Respiratory: Negative.  Negative for stridor.   Cardiovascular: Negative.   Gastrointestinal: Negative.   Genitourinary: Negative.   Musculoskeletal: Positive for back pain. Negative for myalgias, joint pain, falls and neck pain.  Skin: Negative.   Neurological: Headaches:  TMJ.  Endo/Heme/Allergies: Negative.   Psychiatric/Behavioral: Negative.     Family history- Review and unchanged Social history- Review and unchanged Physical Exam: BP 122/72 mmHg  Pulse 64  Temp(Src) 97.7 F (36.5 C)  Resp 16  Wt 276 lb (125.193 kg) Wt Readings from Last 3 Encounters:  06/26/14 276 lb (125.193 kg)  04/27/14 283 lb (128.368 kg)  02/25/14 285 lb (129.275 kg)   General Appearance: Well nourished, in no apparent distress. Eyes: PERRLA, EOMs, conjunctiva no swelling or erythema Sinuses: No Frontal/maxillary tenderness ENT/Mouth: Ext aud canals clear, TMs without erythema, bulging. No erythema, swelling, or exudate on post pharynx.  Tonsils not swollen or erythematous. Hearing normal.  Neck: Supple, thyroid normal.  Respiratory: Respiratory effort normal, BS equal bilaterally without rales, rhonchi, wheezing or stridor.  Cardio: RRR with no MRGs. Brisk peripheral pulses without edema.  Abdomen: Soft, obese, + BS,  Non tender, no guarding, rebound, hernias, masses. Lymphatics: Non tender without lymphadenopathy.  Musculoskeletal: Full ROM, 5/5 strength, Antalgic gait Skin: Warm, dry without rashes, lesions, ecchymosis.  Neuro: Cranial nerves intact. Normal muscle tone, no cerebellar symptoms. Psych: Awake and  oriented X 3, normal affect, Insight and Judgment appropriate.    Quentin Mulling, PA-C 11:08 AM [facility]

## 2014-07-01 ENCOUNTER — Ambulatory Visit: Payer: Self-pay | Admitting: Physician Assistant

## 2014-07-23 ENCOUNTER — Other Ambulatory Visit: Payer: Self-pay | Admitting: Physician Assistant

## 2014-07-23 MED ORDER — METHOCARBAMOL 500 MG PO TABS: ORAL_TABLET | ORAL | Status: DC

## 2014-08-05 ENCOUNTER — Other Ambulatory Visit: Payer: Self-pay | Admitting: Physician Assistant

## 2014-08-05 ENCOUNTER — Other Ambulatory Visit: Payer: Self-pay | Admitting: Internal Medicine

## 2014-08-13 ENCOUNTER — Other Ambulatory Visit: Payer: Self-pay | Admitting: Physician Assistant

## 2014-09-02 ENCOUNTER — Other Ambulatory Visit: Payer: Self-pay | Admitting: Physician Assistant

## 2014-09-09 ENCOUNTER — Encounter: Payer: Self-pay | Admitting: Physician Assistant

## 2014-09-30 ENCOUNTER — Encounter: Payer: Self-pay | Admitting: Physician Assistant

## 2014-10-01 ENCOUNTER — Other Ambulatory Visit: Payer: Self-pay

## 2014-10-01 ENCOUNTER — Other Ambulatory Visit: Payer: Self-pay | Admitting: Orthopedic Surgery

## 2014-10-02 ENCOUNTER — Encounter: Payer: Self-pay | Admitting: Vascular Surgery

## 2014-10-07 ENCOUNTER — Other Ambulatory Visit: Payer: Self-pay | Admitting: Internal Medicine

## 2014-10-07 MED ORDER — BUPROPION HCL ER (XL) 300 MG PO TB24: ORAL_TABLET | ORAL | Status: DC

## 2014-10-12 ENCOUNTER — Encounter: Payer: Self-pay | Admitting: Vascular Surgery

## 2014-10-14 ENCOUNTER — Encounter (HOSPITAL_COMMUNITY): Payer: Self-pay

## 2014-10-14 ENCOUNTER — Encounter (HOSPITAL_COMMUNITY)
Admission: RE | Admit: 2014-10-14 | Discharge: 2014-10-14 | Disposition: A | Discharge status: Home / Self Care | Payer: BLUE CROSS/BLUE SHIELD | Source: Ambulatory Visit | Attending: Orthopedic Surgery | Admitting: Orthopedic Surgery

## 2014-10-14 ENCOUNTER — Other Ambulatory Visit (HOSPITAL_COMMUNITY): Payer: Self-pay

## 2014-10-14 ENCOUNTER — Ambulatory Visit (INDEPENDENT_AMBULATORY_CARE_PROVIDER_SITE_OTHER): Payer: BLUE CROSS/BLUE SHIELD | Admitting: Vascular Surgery

## 2014-10-14 ENCOUNTER — Encounter: Payer: Self-pay | Admitting: Vascular Surgery

## 2014-10-14 VITALS — BP 127/82 | HR 85 | Temp 97.8°F | Resp 16 | Ht 64.0 in | Wt 270.0 lb

## 2014-10-14 DIAGNOSIS — M4806 Spinal stenosis, lumbar region: Secondary | ICD-10-CM | POA: Diagnosis not present

## 2014-10-14 DIAGNOSIS — Z01812 Encounter for preprocedural laboratory examination: Secondary | ICD-10-CM | POA: Diagnosis not present

## 2014-10-14 DIAGNOSIS — Z0183 Encounter for blood typing: Secondary | ICD-10-CM | POA: Insufficient documentation

## 2014-10-14 DIAGNOSIS — M5136 Other intervertebral disc degeneration, lumbar region: Secondary | ICD-10-CM

## 2014-10-14 DIAGNOSIS — Z01818 Encounter for other preprocedural examination: Secondary | ICD-10-CM | POA: Diagnosis present

## 2014-10-14 HISTORY — DX: Headache, unspecified: R51.9

## 2014-10-14 HISTORY — DX: Anemia, unspecified: D64.9

## 2014-10-14 HISTORY — DX: Headache: R51

## 2014-10-14 HISTORY — DX: Gastro-esophageal reflux disease without esophagitis: K21.9

## 2014-10-14 HISTORY — DX: Unspecified osteoarthritis, unspecified site: M19.90

## 2014-10-14 LAB — URINALYSIS, ROUTINE W REFLEX MICROSCOPIC
Bilirubin Urine: NEGATIVE
GLUCOSE, UA: NEGATIVE mg/dL
HGB URINE DIPSTICK: NEGATIVE
KETONES UR: NEGATIVE mg/dL
LEUKOCYTES UA: NEGATIVE
Nitrite: NEGATIVE
PROTEIN: NEGATIVE mg/dL
Specific Gravity, Urine: 1.009 (ref 1.005–1.030)
UROBILINOGEN UA: 0.2 mg/dL (ref 0.0–1.0)
pH: 5.5 (ref 5.0–8.0)

## 2014-10-14 LAB — ABO/RH: ABO/RH(D): O POS

## 2014-10-14 LAB — CBC WITH DIFFERENTIAL/PLATELET
BASOS ABS: 0.1 10*3/uL (ref 0.0–0.1)
BASOS PCT: 1 %
EOS ABS: 0.3 10*3/uL (ref 0.0–0.7)
EOS PCT: 3 %
HCT: 37.2 % (ref 36.0–46.0)
Hemoglobin: 12.4 g/dL (ref 12.0–15.0)
Lymphocytes Relative: 30 %
Lymphs Abs: 3.1 10*3/uL (ref 0.7–4.0)
MCH: 28.2 pg (ref 26.0–34.0)
MCHC: 33.3 g/dL (ref 30.0–36.0)
MCV: 84.7 fL (ref 78.0–100.0)
MONO ABS: 0.7 10*3/uL (ref 0.1–1.0)
MONOS PCT: 6 %
Neutro Abs: 6.4 10*3/uL (ref 1.7–7.7)
Neutrophils Relative %: 60 %
PLATELETS: 383 10*3/uL (ref 150–400)
RBC: 4.39 MIL/uL (ref 3.87–5.11)
RDW: 13.4 % (ref 11.5–15.5)
WBC: 10.5 10*3/uL (ref 4.0–10.5)

## 2014-10-14 LAB — COMPREHENSIVE METABOLIC PANEL
ALBUMIN: 4 g/dL (ref 3.5–5.0)
ALT: 15 U/L (ref 14–54)
ANION GAP: 10 (ref 5–15)
AST: 17 U/L (ref 15–41)
Alkaline Phosphatase: 87 U/L (ref 38–126)
BILIRUBIN TOTAL: 0.2 mg/dL — AB (ref 0.3–1.2)
BUN: 15 mg/dL (ref 6–20)
CHLORIDE: 101 mmol/L (ref 101–111)
CO2: 24 mmol/L (ref 22–32)
Calcium: 9.1 mg/dL (ref 8.9–10.3)
Creatinine, Ser: 0.69 mg/dL (ref 0.44–1.00)
GFR calc Af Amer: >60 mL/min (ref >60)
GFR calc non Af Amer: >60 mL/min (ref >60)
GLUCOSE: 76 mg/dL (ref 65–99)
POTASSIUM: 4 mmol/L (ref 3.5–5.1)
Sodium: 135 mmol/L (ref 135–145)
TOTAL PROTEIN: 7.3 g/dL (ref 6.5–8.1)

## 2014-10-14 LAB — SURGICAL PCR SCREEN
MRSA, PCR: NEGATIVE
Staphylococcus aureus: NEGATIVE

## 2014-10-14 LAB — PROTIME-INR
INR: 0.99 (ref 0.00–1.49)
Prothrombin Time: 13.3 seconds (ref 11.6–15.2)

## 2014-10-14 LAB — HCG, SERUM, QUALITATIVE: Preg, Serum: NEGATIVE

## 2014-10-14 LAB — APTT: APTT: 33 s (ref 24–37)

## 2014-10-14 LAB — PREPARE RBC (CROSSMATCH)

## 2014-10-14 NOTE — Pre-Procedure Instructions (Signed)
    Damon M Sedita  10/14/2014      [facility] [address] [place], [place] - [address] AT [facility] [address] [place] [address] Phone: [phone] Fax: [phone]  [facility] - [place], [place] - [address] [place] [address] Phone: [phone] Fax: [phone]    Your procedure is scheduled on 10-22-2014  Thursday   Report to [facility] Admitting at 5:30 A.M.   Call this number if you have problems the morning of surgery:  [phone]   Remember:  Do not eat food or drink liquids after midnight.   Take these medicines the morning of surgery with A SIP OF WATER Wellbutrin,pain medication if needed,lyricia,methocarbenol(Robaxin),tramadol if needed   Do not wear jewelry, make-up or nail polish.  Do not wear lotions, powders, or perfumes.  You may not wear deodorant.  Do not shave 48 hours prior to surgery. .  Do not bring valuables to the hospital.  [facility] is not responsible for any belongings or valuables.  Contacts, dentures or bridgework may not be worn into surgery.  Leave your suitcase in the car.  After surgery it may be brought to your room.  For patients admitted to the hospital, discharge time will be determined by your treatment team.     Special instructions:  See attached sheet for instructions on CHG shower       Please read over the following fact sheets that you were given. Pain Booklet, Coughing and Deep Breathing, Blood Transfusion Information and Surgical Site Infection Prevention

## 2014-10-14 NOTE — Progress Notes (Signed)
[facility] [place]  Patient name: Emily Gay MRN: [id number] DOB: 09/14/1970 Sex: female  REASON FOR CONSULT: Evaluate for anterior retroperitoneal exposure of L4-L5. Referred by Dr. Yevette Edwards.   HPI: Emily Gay is a 44 y.o. female who is had a long history of low back pain and also leg pain. She has failed conservative treatment. She was sent for evaluation for anterior retroperitoneal exposure of L5-S1.  I have reviewed the records that were sent from Dr. Marshell Levan office. The patient has had low back pain and bilateral leg pain. This has been present for about 2 years. She has failed multiple forms of conservative care. She has significant L4-L5 degenerative disc disease.  Past Medical History  Diagnosis Date  . Allergic rhinitis   . Hypertension   . Obesity   . Cholelithiasis   . Prediabetes   . Chronic low back pain   . OSA on CPAP   . 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   Her cholecystectomy was done laparoscopically.  Family History  Problem Relation Age of Onset  . Cancer Mother   . Lung disease Mother   . Arthritis Father   . Hypertension Father   . Stroke Father   . Melanoma Sister   . Fibromyalgia Sister   . Mental illness Other     emotional illness (other blood relative)  . Stroke Other     Stroke (parent and grandparent)  . Heart disease Other     (grandparent)  . Hypertension Other     (other blood relative)  . Arthritis Other     Parent and grandparent   SOCIAL HISTORY: Social History  Substance Use Topics  . Smoking status: Never Smoker   . Smokeless tobacco: Never Used  . Alcohol Use: Yes     Comment: occasionally   No Known Allergies Current Outpatient Prescriptions  Medication Sig Dispense Refill  . acetaminophen (TYLENOL) 500 MG tablet Take 500 mg by mouth every 6 (six) hours as needed (pain).     Marland Kitchen buPROPion (WELLBUTRIN XL) 300 MG 24 hr tablet TAKE 1 TABLET (300 MG TOTAL) BY MOUTH EVERY MORNING. 30 tablet 2  .  celecoxib (CELEBREX) 200 MG capsule TAKE 1 CAPSULE (200 MG TOTAL) BY MOUTH 2 (TWO) TIMES DAILY. FOR PAIN & INFLAMMATION 60 capsule 4  . Cholecalciferol (VITAMIN D PO) Take 2,000 Units by mouth daily.     . fluticasone (FLONASE) 50 MCG/ACT nasal spray Place 2 sprays into both nostrils at bedtime.    Marland Kitchen HYDROCODONE-ACETAMINOPHEN PO Take by mouth as needed.    Marland Kitchen levocetirizine (XYZAL) 5 MG tablet TAKE 1 TABLET BY MOUTH DAILY 30 tablet 3  . LYRICA 75 MG capsule TAKE 1 CAPSULE BY MOUTH 3 TIMES DAILY AND 2 CAPSULES AT NIGHT (Patient taking differently: TAKE 2 CAPSULE BY MOUTH  TIMES DAILY) 150 capsule 3  . methocarbamol (ROBAXIN) 500 MG tablet TAKE 1 TABLET (500 MG TOTAL) BY MOUTH 3 (THREE) TIMES DAILY AS NEEDED FOR MUSCLE SPASMS. 90 tablet 1  . methylPREDNISolone (MEDROL DOSEPAK) 4 MG tablet follow package directions 21 tablet 0  . Multiple Vitamin (MULTIVITAMIN) capsule Take 1 capsule by mouth daily.    . pantoprazole (PROTONIX) 40 MG tablet TAKE 1 TABLET (40 MG TOTAL) BY MOUTH DAILY. 90 tablet 0  . phentermine (ADIPEX-P) 37.5 MG tablet Take 1 tablet (37.5 mg total) by mouth daily before breakfast. 30 tablet 0  . traMADol (ULTRAM) 50 MG tablet Take 1 tablet (50 mg total) by mouth  3 (three) times daily as needed. (Patient taking differently: Take 50 mg by mouth 3 (three) times daily as needed (pain). ) 90 tablet 0   No current facility-administered medications for this visit.   REVIEW OF SYSTEMS: Arly.Keller ] denotes positive finding; [  ] denotes negative finding  CARDIOVASCULAR:   chest pain    chest pressure    palpitations    orthopnea    dyspnea on exertion    claudication    rest pain    DVT    phlebitis PULMONARY:    productive cough    asthma    wheezing NEUROLOGIC:    weakness   paresthesias   aphasia   amaurosis   dizziness HEMATOLOGIC:    bleeding problems    clotting disorders MUSCULOSKELETAL:   joint pain    joint swelling  leg  swelling GASTROINTESTINAL:   blood in stool    hematemesis GENITOURINARY:    dysuria    hematuria PSYCHIATRIC:   history of major depression INTEGUMENTARY:   rashes   ulcers CONSTITUTIONAL:   fever    chills  PHYSICAL EXAM: There were no vitals filed for this visit.  Body mass index is 46.32 kg/(m^2).  GENERAL: The patient is a morbidly obese female, in no acute distress. The vital signs are documented above. CARDIAC: There is a regular rate and rhythm.  VASCULAR: I do not detect carotid bruits. She has palpable posterior tibial pulses bilaterally. She has mild bilateral lower extremity swelling. PULMONARY: There is good air exchange bilaterally without wheezing or rales. ABDOMEN: Obese. Normal pitched bowel sounds. MUSCULOSKELETAL: There are no major deformities or cyanosis. NEUROLOGIC: No focal weakness or paresthesias are detected. SKIN: There are no ulcers or rashes noted. PSYCHIATRIC: The patient has a normal affect.  DATA:  Plain lumbar spine films from 11/18/2012: This shows multilevel degenerative disc disease and lumbar spondylosis including 6 mm of anterolisthesis at L4-L5.  I reviewed her previous MRI. I do not see any technical issues at the L4-L5 level.  MEDICAL ISSUES:  DEGENERATIVE DISC DISEASE L4-L5: I have been asked to evaluate her for anterior rectum until exposure of L4 L5. I'm concerned about her weight. Her BMI is 46. I've explained that the L4-L5 level is the most dangerous level given that we need to retract the aorta, left common iliac artery, and left common iliac vein all the way to the right side to allow adequate exposure of the L4-L5 disc. Try to call Dr. Yevette Edwards today but he is in surgery. If there are other approaches that can be used, I would favor an alternative approach. If this is the only approach, given the severity of her symptoms, I would be willing to attempt the exposure. I have explained that if I did not think he  could be done safely is a chance that we would not be successful inadequately boasting the disc. We have discussed the potential complications of surgery including the risk of injury, bleeding, or clotting of the arteries or veins. She would be at increased risk because of her weight.   Waverly Ferrari [facility] [place] Beeper: [phone]  \

## 2014-10-21 ENCOUNTER — Encounter: Payer: Self-pay | Admitting: Vascular Surgery

## 2014-10-21 MED ORDER — CHLORHEXIDINE GLUCONATE 4 % EX LIQD: 60.0000 mL | Freq: Once | CUTANEOUS | Status: DC

## 2014-10-21 MED ORDER — POVIDONE-IODINE 7.5 % EX SOLN
CUTANEOUS | Status: DC
  Filled 2014-10-21: qty 118

## 2014-10-21 MED ORDER — DEXTROSE 5 % IV SOLN
3.0000 g | INTRAVENOUS | Status: AC
  Administered 2014-10-22: 3 g via INTRAVENOUS
  Administered 2014-10-22: 2 g via INTRAVENOUS
  Filled 2014-10-21: qty 3000

## 2014-10-21 NOTE — H&P (Signed)
PREOPERATIVE H&P  Chief Complaint: Bilateral leg pain  HPI: Emily Gay is a 44 y.o. female who presents with ongoing pain in the bilateral legs x 2.5 years  MRI reveals severe stenosis at L4/5. Radiographs reveal a high grade 2 spondylolisthesis. Initial plan was to proceed with an ALIF via an anterior approach with Dr. Edilia Bo. However, he did discuss with me that, given patient's obesity, he was not comfortable proceeding with an anterior approach. I then reviewed patient's images and felt that a lateral retroperitoneal approach would be an alternative option. This was discussed with patient, and patient did elect to proceed.   Patient has failed multiple forms of conservative care and continues to have pain (see office notes for additional details regarding the patient's full course of treatment)  Past Medical History  Diagnosis Date  . Allergic rhinitis   . Hypertension   . Obesity   . Chronic low back pain   . 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 OSA on CPAP     does not use CPAP  . GERD (gastroesophageal reflux disease)   . Headache     migraine  . Arthritis   . Anemia    Past Surgical History  Procedure Laterality Date  . Cholecystectomy  Feb. 2011    Gall Bladder   Social History   Social History  . Marital Status: Married    Spouse Name: N/A  . Number of Children: N/A  . Years of Education: college   Occupational History  . Manager    Social History Main Topics  . Smoking status: Never Smoker   . Smokeless tobacco: Never Used  . Alcohol Use: Yes     Comment: occasionally  . Drug Use: No  . Sexual Activity: Not on file   Other Topics Concern  . 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   Family History  Problem Relation Age of Onset  . Cancer Mother   .  Lung disease Mother   . Arthritis Father   . Hypertension Father   . Stroke Father   . Melanoma Sister   . Cancer Sister     Melanoma  . Fibromyalgia Sister   . Mental illness Other     emotional illness (other blood relative)  . Stroke Other     Stroke (parent and grandparent)  . Heart disease Other     (grandparent)  . Hypertension Other     (other blood relative)  . Arthritis Other     Parent and grandparent   No Known Allergies Prior to Admission medications   Medication Sig Start Date End Date Taking? Authorizing Provider  acetaminophen (TYLENOL) 500 MG tablet Take 500 mg by mouth every 6 (six) hours as needed (pain).    Yes Historical Provider, MD  buPROPion (WELLBUTRIN XL) 300 MG 24 hr tablet TAKE 1 TABLET (300 MG TOTAL) BY MOUTH EVERY MORNING. 10/07/14  Yes Lucky Cowboy, MD  celecoxib (CELEBREX) 200 MG capsule TAKE 1 CAPSULE (200 MG TOTAL) BY MOUTH 2 (TWO) TIMES DAILY. FOR PAIN & INFLAMMATION 03/25/14  Yes Quentin Mulling, PA-C  Cholecalciferol (VITAMIN D PO) Take 2,000 Units by mouth daily.    Yes Historical Provider, MD  fluticasone (FLONASE) 50 MCG/ACT nasal spray Place 2 sprays into both nostrils at bedtime.   Yes Historical  Provider, MD  HYDROcodone-acetaminophen (NORCO/VICODIN) 5-325 MG per tablet Take 1 tablet by mouth 2 (two) times daily as needed for severe pain.  09/09/14  Yes Historical Provider, MD  levocetirizine (XYZAL) 5 MG tablet TAKE 1 TABLET BY MOUTH DAILY 06/16/14  Yes Lucky Cowboy, MD  LYRICA 75 MG capsule TAKE 1 CAPSULE BY MOUTH 3 TIMES DAILY AND 2 CAPSULES AT NIGHT Patient taking differently: TAKE 2 CAPSULE BY MOUTH 3  TIMES DAILY 09/03/14  Yes Quentin Mulling, PA-C  methocarbamol (ROBAXIN) 500 MG tablet TAKE 1 TABLET (500 MG TOTAL) BY MOUTH 3 (THREE) TIMES DAILY AS NEEDED FOR MUSCLE SPASMS. 09/03/14  Yes Quentin Mulling, PA-C  Multiple Vitamin (MULTIVITAMIN) capsule Take 1 capsule by mouth daily.   Yes Historical Provider, MD  pantoprazole (PROTONIX) 40 MG  tablet TAKE 1 TABLET (40 MG TOTAL) BY MOUTH DAILY. 08/05/14  Yes Courtney Forcucci, PA-C  traMADol (ULTRAM) 50 MG tablet Take 1 tablet (50 mg total) by mouth 3 (three) times daily as needed. Patient taking differently: Take 50 mg by mouth every 4 (four) hours as needed (pain).  06/26/14  Yes Quentin Mulling, PA-C     All other systems have been reviewed and were otherwise negative with the exception of those mentioned in the HPI and as above.  Physical Exam: There were no vitals filed for this visit.  General: Alert, no acute distress Cardiovascular: No pedal edema Respiratory: No cyanosis, no use of accessory musculature Skin: No lesions in the area of chief complaint Neurologic: Sensation intact distally Psychiatric: Patient is competent for consent with normal mood and affect Lymphatic: No axillary or cervical lymphadenopathy    Assessment/Plan: Spinal stenosis, grade 2 spondylolisthesis, L4/5 Plan for Procedure(s): ANTERIOR LUMBAR FUSION 1 LEVEL POSTERIOR LUMBAR FUSION 1 LEVEL   [name], MD 10/21/2014 3:32 PM

## 2014-10-22 ENCOUNTER — Encounter (HOSPITAL_COMMUNITY)
Admission: RE | Disposition: A | Discharge status: Home / Self Care | Payer: Self-pay | Source: Ambulatory Visit | Attending: Orthopedic Surgery

## 2014-10-22 ENCOUNTER — Inpatient Hospital Stay (HOSPITAL_COMMUNITY): Payer: BLUE CROSS/BLUE SHIELD | Admitting: Anesthesiology

## 2014-10-22 ENCOUNTER — Encounter (HOSPITAL_COMMUNITY): Payer: Self-pay

## 2014-10-22 ENCOUNTER — Inpatient Hospital Stay (HOSPITAL_COMMUNITY)
Admission: RE | Admit: 2014-10-22 | Discharge: 2014-10-24 | DRG: 454 | Disposition: A | Discharge status: Home / Self Care | Payer: BLUE CROSS/BLUE SHIELD | Source: Ambulatory Visit | Attending: Orthopedic Surgery | Admitting: Orthopedic Surgery

## 2014-10-22 ENCOUNTER — Inpatient Hospital Stay (HOSPITAL_COMMUNITY): Payer: BLUE CROSS/BLUE SHIELD

## 2014-10-22 DIAGNOSIS — Z8249 Family history of ischemic heart disease and other diseases of the circulatory system: Secondary | ICD-10-CM | POA: Diagnosis not present

## 2014-10-22 DIAGNOSIS — M62830 Muscle spasm of back: Secondary | ICD-10-CM | POA: Diagnosis not present

## 2014-10-22 DIAGNOSIS — M5416 Radiculopathy, lumbar region: Principal | ICD-10-CM | POA: Diagnosis present

## 2014-10-22 DIAGNOSIS — I10 Essential (primary) hypertension: Secondary | ICD-10-CM | POA: Diagnosis present

## 2014-10-22 DIAGNOSIS — Z823 Family history of stroke: Secondary | ICD-10-CM | POA: Diagnosis not present

## 2014-10-22 DIAGNOSIS — M4806 Spinal stenosis, lumbar region: Secondary | ICD-10-CM | POA: Diagnosis present

## 2014-10-22 DIAGNOSIS — M4316 Spondylolisthesis, lumbar region: Secondary | ICD-10-CM | POA: Diagnosis present

## 2014-10-22 DIAGNOSIS — Z79891 Long term (current) use of opiate analgesic: Secondary | ICD-10-CM

## 2014-10-22 DIAGNOSIS — Z8261 Family history of arthritis: Secondary | ICD-10-CM

## 2014-10-22 DIAGNOSIS — K219 Gastro-esophageal reflux disease without esophagitis: Secondary | ICD-10-CM | POA: Diagnosis present

## 2014-10-22 DIAGNOSIS — Z79899 Other long term (current) drug therapy: Secondary | ICD-10-CM | POA: Diagnosis not present

## 2014-10-22 DIAGNOSIS — M4800 Spinal stenosis, site unspecified: Secondary | ICD-10-CM

## 2014-10-22 DIAGNOSIS — M79606 Pain in leg, unspecified: Secondary | ICD-10-CM | POA: Diagnosis present

## 2014-10-22 DIAGNOSIS — Z818 Family history of other mental and behavioral disorders: Secondary | ICD-10-CM

## 2014-10-22 DIAGNOSIS — Z6841 Body Mass Index (BMI) 40.0 and over, adult: Secondary | ICD-10-CM

## 2014-10-22 DIAGNOSIS — M5410 Radiculopathy, site unspecified: Secondary | ICD-10-CM | POA: Diagnosis present

## 2014-10-22 DIAGNOSIS — G4733 Obstructive sleep apnea (adult) (pediatric): Secondary | ICD-10-CM | POA: Diagnosis present

## 2014-10-22 HISTORY — PX: ANTERIOR LAT LUMBAR FUSION: SHX1168

## 2014-10-22 SURGERY — POSTERIOR LUMBAR FUSION 1 LEVEL
Anesthesia: General | Site: Back

## 2014-10-22 MED ORDER — BUPROPION HCL ER (XL) 300 MG PO TB24
300.0000 mg | ORAL_TABLET | Freq: Every day | ORAL | Status: DC
  Administered 2014-10-23 – 2014-10-24 (×2): 300 mg via ORAL
  Filled 2014-10-22 (×2): qty 1

## 2014-10-22 MED ORDER — THROMBIN 20000 UNITS EX KIT
PACK | CUTANEOUS | Status: AC
  Filled 2014-10-22: qty 2

## 2014-10-22 MED ORDER — BUPIVACAINE-EPINEPHRINE 0.25% -1:200000 IJ SOLN
INTRAMUSCULAR | Status: DC | PRN
  Administered 2014-10-22: 10 mL

## 2014-10-22 MED ORDER — DEXAMETHASONE SODIUM PHOSPHATE 4 MG/ML IJ SOLN
INTRAMUSCULAR | Status: AC
  Filled 2014-10-22: qty 2

## 2014-10-22 MED ORDER — PROPOFOL 10 MG/ML IV BOLUS
INTRAVENOUS | Status: DC | PRN
Start: 2014-10-22 — End: 2014-10-22
  Administered 2014-10-22: 150 mg via INTRAVENOUS

## 2014-10-22 MED ORDER — CEFAZOLIN SODIUM 1-5 GM-% IV SOLN
1.0000 g | Freq: Three times a day (TID) | INTRAVENOUS | Status: AC
  Administered 2014-10-22 – 2014-10-23 (×2): 1 g via INTRAVENOUS
  Filled 2014-10-22 (×2): qty 50

## 2014-10-22 MED ORDER — MORPHINE SULFATE (PF) 2 MG/ML IV SOLN
1.0000 mg | INTRAVENOUS | Status: DC | PRN
  Administered 2014-10-23: 4 mg via INTRAVENOUS
  Filled 2014-10-22: qty 2

## 2014-10-22 MED ORDER — ACETAMINOPHEN 650 MG RE SUPP: 650.0000 mg | RECTAL | Status: DC | PRN

## 2014-10-22 MED ORDER — LIDOCAINE HCL (CARDIAC) 20 MG/ML IV SOLN
INTRAVENOUS | Status: DC | PRN
  Administered 2014-10-22: 40 mg via INTRAVENOUS
  Administered 2014-10-22: 60 mg via INTRAVENOUS

## 2014-10-22 MED ORDER — PANTOPRAZOLE SODIUM 40 MG PO TBEC
40.0000 mg | DELAYED_RELEASE_TABLET | Freq: Every day | ORAL | Status: DC
  Administered 2014-10-22 – 2014-10-24 (×3): 40 mg via ORAL
  Filled 2014-10-22 (×3): qty 1

## 2014-10-22 MED ORDER — SODIUM CHLORIDE 0.9 % IJ SOLN: 3.0000 mL | INTRAMUSCULAR | Status: DC | PRN

## 2014-10-22 MED ORDER — MIDAZOLAM HCL 2 MG/2ML IJ SOLN
INTRAMUSCULAR | Status: AC
  Filled 2014-10-22: qty 4

## 2014-10-22 MED ORDER — ZOLPIDEM TARTRATE 5 MG PO TABS
5.0000 mg | ORAL_TABLET | Freq: Every evening | ORAL | Status: DC | PRN
  Filled 2014-10-22: qty 1

## 2014-10-22 MED ORDER — FENTANYL CITRATE (PF) 100 MCG/2ML IJ SOLN
INTRAMUSCULAR | Status: DC | PRN
  Administered 2014-10-22 (×7): 50 ug via INTRAVENOUS
  Administered 2014-10-22: 100 ug via INTRAVENOUS
  Administered 2014-10-22: 50 ug via INTRAVENOUS

## 2014-10-22 MED ORDER — VITAMIN D 1000 UNITS PO TABS
2000.0000 [IU] | ORAL_TABLET | Freq: Every day | ORAL | Status: DC
  Administered 2014-10-22 – 2014-10-23 (×2): 2000 [IU] via ORAL
  Filled 2014-10-22 (×2): qty 2

## 2014-10-22 MED ORDER — FENTANYL CITRATE (PF) 250 MCG/5ML IJ SOLN
INTRAMUSCULAR | Status: AC
  Filled 2014-10-22: qty 5

## 2014-10-22 MED ORDER — 0.9 % SODIUM CHLORIDE (POUR BTL) OPTIME
TOPICAL | Status: DC | PRN
  Administered 2014-10-22: 1000 mL

## 2014-10-22 MED ORDER — HYDROMORPHONE HCL 1 MG/ML IJ SOLN
INTRAMUSCULAR | Status: AC
  Administered 2014-10-22: 0.5 mg via INTRAVENOUS
  Filled 2014-10-22: qty 1

## 2014-10-22 MED ORDER — PREGABALIN 75 MG PO CAPS
75.0000 mg | ORAL_CAPSULE | Freq: Three times a day (TID) | ORAL | Status: DC
  Administered 2014-10-22 – 2014-10-24 (×6): 75 mg via ORAL
  Filled 2014-10-22 (×6): qty 1

## 2014-10-22 MED ORDER — DIAZEPAM 5 MG PO TABS
5.0000 mg | ORAL_TABLET | Freq: Four times a day (QID) | ORAL | Status: DC | PRN
  Administered 2014-10-22 – 2014-10-24 (×6): 5 mg via ORAL
  Filled 2014-10-22 (×5): qty 1

## 2014-10-22 MED ORDER — LIDOCAINE HCL (CARDIAC) 20 MG/ML IV SOLN
INTRAVENOUS | Status: AC
  Filled 2014-10-22: qty 5

## 2014-10-22 MED ORDER — OXYCODONE-ACETAMINOPHEN 5-325 MG PO TABS
1.0000 | ORAL_TABLET | ORAL | Status: DC | PRN
  Administered 2014-10-22 – 2014-10-23 (×7): 2 via ORAL
  Filled 2014-10-22 (×7): qty 2

## 2014-10-22 MED ORDER — BUPIVACAINE-EPINEPHRINE (PF) 0.25% -1:200000 IJ SOLN
INTRAMUSCULAR | Status: AC
  Filled 2014-10-22: qty 30

## 2014-10-22 MED ORDER — MENTHOL 3 MG MT LOZG: 1.0000 | LOZENGE | OROMUCOSAL | Status: DC | PRN

## 2014-10-22 MED ORDER — PROMETHAZINE HCL 25 MG/ML IJ SOLN
INTRAMUSCULAR | Status: AC
Start: 2014-10-22 — End: 2014-10-22
  Administered 2014-10-22: 6.25 mg via INTRAVENOUS
  Filled 2014-10-22: qty 1

## 2014-10-22 MED ORDER — ONDANSETRON HCL 4 MG/2ML IJ SOLN
INTRAMUSCULAR | Status: AC
  Filled 2014-10-22: qty 2

## 2014-10-22 MED ORDER — SENNOSIDES-DOCUSATE SODIUM 8.6-50 MG PO TABS
1.0000 | ORAL_TABLET | Freq: Every evening | ORAL | Status: DC | PRN
  Administered 2014-10-23: 1 via ORAL
  Filled 2014-10-22: qty 1

## 2014-10-22 MED ORDER — PROPOFOL 500 MG/50ML IV EMUL
INTRAVENOUS | Status: DC | PRN
  Administered 2014-10-22: 50 ug/kg/min via INTRAVENOUS

## 2014-10-22 MED ORDER — OXYCODONE HCL 5 MG PO TABS
ORAL_TABLET | ORAL | Status: AC
  Administered 2014-10-22: 5 mg via ORAL
  Filled 2014-10-22: qty 1

## 2014-10-22 MED ORDER — ACETAMINOPHEN 325 MG PO TABS: 650.0000 mg | ORAL_TABLET | ORAL | Status: DC | PRN

## 2014-10-22 MED ORDER — DIAZEPAM 5 MG PO TABS
ORAL_TABLET | ORAL | Status: AC
  Administered 2014-10-22: 5 mg via ORAL
  Filled 2014-10-22: qty 1

## 2014-10-22 MED ORDER — SODIUM CHLORIDE 0.9 % IJ SOLN
3.0000 mL | Freq: Two times a day (BID) | INTRAMUSCULAR | Status: DC
  Administered 2014-10-22: 3 mL via INTRAVENOUS

## 2014-10-22 MED ORDER — ACETAMINOPHEN 10 MG/ML IV SOLN
INTRAVENOUS | Status: DC | PRN
  Administered 2014-10-22: 1000 mg via INTRAVENOUS

## 2014-10-22 MED ORDER — SUCCINYLCHOLINE CHLORIDE 20 MG/ML IJ SOLN
INTRAMUSCULAR | Status: DC | PRN
  Administered 2014-10-22: 100 mg via INTRAVENOUS

## 2014-10-22 MED ORDER — PHENOL 1.4 % MT LIQD: 1.0000 | OROMUCOSAL | Status: DC | PRN

## 2014-10-22 MED ORDER — DOCUSATE SODIUM 100 MG PO CAPS
100.0000 mg | ORAL_CAPSULE | Freq: Two times a day (BID) | ORAL | Status: DC
  Administered 2014-10-22 – 2014-10-24 (×5): 100 mg via ORAL
  Filled 2014-10-22 (×5): qty 1

## 2014-10-22 MED ORDER — SODIUM CHLORIDE 0.9 % IV SOLN: 250.0000 mL | INTRAVENOUS | Status: DC

## 2014-10-22 MED ORDER — PHENYLEPHRINE HCL 10 MG/ML IJ SOLN
10.0000 mg | INTRAMUSCULAR | Status: DC | PRN
  Administered 2014-10-22: 10 ug/min via INTRAVENOUS

## 2014-10-22 MED ORDER — HYDROMORPHONE HCL 1 MG/ML IJ SOLN
0.2500 mg | INTRAMUSCULAR | Status: DC | PRN
  Administered 2014-10-22 (×4): 0.5 mg via INTRAVENOUS

## 2014-10-22 MED ORDER — MIDAZOLAM HCL 5 MG/5ML IJ SOLN
INTRAMUSCULAR | Status: DC | PRN
  Administered 2014-10-22: 2 mg via INTRAVENOUS

## 2014-10-22 MED ORDER — THROMBIN 20000 UNITS EX SOLR
CUTANEOUS | Status: AC
  Filled 2014-10-22: qty 40000

## 2014-10-22 MED ORDER — LORATADINE 10 MG PO TABS
10.0000 mg | ORAL_TABLET | Freq: Every day | ORAL | Status: DC
  Administered 2014-10-22 – 2014-10-24 (×3): 10 mg via ORAL
  Filled 2014-10-22 (×3): qty 1

## 2014-10-22 MED ORDER — OXYCODONE HCL 5 MG PO TABS
5.0000 mg | ORAL_TABLET | Freq: Once | ORAL | Status: AC | PRN
  Administered 2014-10-22: 5 mg via ORAL

## 2014-10-22 MED ORDER — LEVOCETIRIZINE DIHYDROCHLORIDE 5 MG PO TABS: 5.0000 mg | ORAL_TABLET | Freq: Every day | ORAL | Status: DC

## 2014-10-22 MED ORDER — FLUTICASONE PROPIONATE 50 MCG/ACT NA SUSP
2.0000 | Freq: Every day | NASAL | Status: DC
  Administered 2014-10-22: 2 via NASAL
  Filled 2014-10-22: qty 16

## 2014-10-22 MED ORDER — ONDANSETRON HCL 4 MG/2ML IJ SOLN: 4.0000 mg | INTRAMUSCULAR | Status: DC | PRN

## 2014-10-22 MED ORDER — ONDANSETRON HCL 4 MG/2ML IJ SOLN
INTRAMUSCULAR | Status: DC | PRN
Start: 2014-10-22 — End: 2014-10-22
  Administered 2014-10-22: 4 mg via INTRAVENOUS

## 2014-10-22 MED ORDER — ACETAMINOPHEN 10 MG/ML IV SOLN
INTRAVENOUS | Status: AC
  Filled 2014-10-22: qty 100

## 2014-10-22 MED ORDER — PROPOFOL 10 MG/ML IV BOLUS
INTRAVENOUS | Status: AC
  Filled 2014-10-22: qty 20

## 2014-10-22 MED ORDER — INFLUENZA VAC SPLIT QUAD 0.5 ML IM SUSY
0.5000 mL | PREFILLED_SYRINGE | INTRAMUSCULAR | Status: DC
  Filled 2014-10-22: qty 0.5

## 2014-10-22 MED ORDER — ALUM & MAG HYDROXIDE-SIMETH 200-200-20 MG/5ML PO SUSP: 30.0000 mL | Freq: Four times a day (QID) | ORAL | Status: DC | PRN

## 2014-10-22 MED ORDER — SODIUM CHLORIDE 0.9 % IV SOLN: INTRAVENOUS | Status: DC

## 2014-10-22 MED ORDER — BISACODYL 5 MG PO TBEC: 5.0000 mg | DELAYED_RELEASE_TABLET | Freq: Every day | ORAL | Status: DC | PRN

## 2014-10-22 MED ORDER — PROMETHAZINE HCL 25 MG/ML IJ SOLN
6.2500 mg | INTRAMUSCULAR | Status: DC | PRN
  Administered 2014-10-22: 6.25 mg via INTRAVENOUS

## 2014-10-22 MED ORDER — LACTATED RINGERS IV SOLN
INTRAVENOUS | Status: DC | PRN
  Administered 2014-10-22: 07:00:00 via INTRAVENOUS

## 2014-10-22 MED ORDER — METHYLENE BLUE 1 % INJ SOLN
INTRAMUSCULAR | Status: AC
  Filled 2014-10-22: qty 10

## 2014-10-22 MED ORDER — OXYCODONE HCL 5 MG/5ML PO SOLN: 5.0000 mg | Freq: Once | ORAL | Status: AC | PRN

## 2014-10-22 SURGICAL SUPPLY — 140 items
APPLIER CLIP 11 MED OPEN (CLIP)
BENZOIN TINCTURE PRP APPL 2/3 (GAUZE/BANDAGES/DRESSINGS) ×3 IMPLANT
BLADE SURG 10 STRL SS (BLADE) ×6 IMPLANT
BLADE SURG ROTATE 9660 (MISCELLANEOUS) IMPLANT
BOLT DECADE 5.5X40 (Bolt) ×3 IMPLANT
BOLT SPNL LRG 45X5.5XPLAT NS (Screw) ×2 IMPLANT
BUR PRESCISION 1.7 ELITE (BURR) IMPLANT
BUR ROUND PRECISION 4.0 (BURR) IMPLANT
BUR SABER RD CUTTING 3.0 (BURR) IMPLANT
CARTRIDGE OIL MAESTRO DRILL (MISCELLANEOUS) IMPLANT
CLIP APPLIE 11 MED OPEN (CLIP) IMPLANT
CLSR STERI-STRIP ANTIMIC 1/2X4 (GAUZE/BANDAGES/DRESSINGS) ×3 IMPLANT
CONT SPEC STER OR (MISCELLANEOUS) ×3 IMPLANT
CORDS BIPOLAR (ELECTRODE) ×6 IMPLANT
COROENT XL LORDOTIC 12X18X45 (Intraocular Lens) ×3 IMPLANT
COVER LIGHT HANDLE STERIS (MISCELLANEOUS) ×3 IMPLANT
COVER MAYO STAND STRL (DRAPES) ×6 IMPLANT
COVER SURGICAL LIGHT HANDLE (MISCELLANEOUS) ×6 IMPLANT
COVER TABLE BACK 60X90 (DRAPES) ×3 IMPLANT
DIFFUSER DRILL AIR PNEUMATIC (MISCELLANEOUS) IMPLANT
DRAIN CHANNEL 15F RND FF W/TCR (WOUND CARE) IMPLANT
DRAPE C-ARM 42X72 X-RAY (DRAPES) ×9 IMPLANT
DRAPE C-ARMOR (DRAPES) ×6 IMPLANT
DRAPE INCISE IOBAN 66X45 STRL (DRAPES) ×6 IMPLANT
DRAPE POUCH INSTRU U-SHP 10X18 (DRAPES) ×3 IMPLANT
DRAPE SURG 17X23 STRL (DRAPES) ×12 IMPLANT
DRSG MEPILEX BORDER 4X12 (GAUZE/BANDAGES/DRESSINGS) IMPLANT
DURAPREP 26ML APPLICATOR (WOUND CARE) ×3 IMPLANT
ELECT BLADE 4.0 EZ CLEAN MEGAD (MISCELLANEOUS)
ELECT CAUTERY BLADE 6.4 (BLADE) ×9 IMPLANT
ELECT REM PT RETURN 9FT ADLT (ELECTROSURGICAL) ×3
ELECTRODE BLDE 4.0 EZ CLN MEGD (MISCELLANEOUS) IMPLANT
ELECTRODE REM PT RTRN 9FT ADLT (ELECTROSURGICAL) ×2 IMPLANT
EVACUATOR SILICONE 100CC (DRAIN) IMPLANT
FEE INTRAOP MONITOR IMPULS NCS (MISCELLANEOUS) ×2 IMPLANT
GAUZE SPONGE 4X4 12PLY STRL (GAUZE/BANDAGES/DRESSINGS) ×3 IMPLANT
GAUZE SPONGE 4X4 16PLY XRAY LF (GAUZE/BANDAGES/DRESSINGS) ×12 IMPLANT
GLOVE BIO SURGEON STRL SZ7 (GLOVE) ×9 IMPLANT
GLOVE BIO SURGEON STRL SZ8 (GLOVE) ×12 IMPLANT
GLOVE BIOGEL PI IND STRL 7.0 (GLOVE) ×8 IMPLANT
GLOVE BIOGEL PI IND STRL 8 (GLOVE) ×12 IMPLANT
GLOVE BIOGEL PI INDICATOR 7.0 (GLOVE) ×4
GLOVE BIOGEL PI INDICATOR 8 (GLOVE) ×6
GLOVE ECLIPSE 7.5 STRL STRAW (GLOVE) ×3 IMPLANT
GOWN STRL REUS W/ TWL LRG LVL3 (GOWN DISPOSABLE) ×12 IMPLANT
GOWN STRL REUS W/ TWL XL LVL3 (GOWN DISPOSABLE) ×6 IMPLANT
GOWN STRL REUS W/TWL LRG LVL3 (GOWN DISPOSABLE) ×6
GOWN STRL REUS W/TWL XL LVL3 (GOWN DISPOSABLE) ×3
GUIDEWIRE BLUNT VIPER II 1.45 (WIRE) IMPLANT
GUIDEWIRE SHARP VIPER II (WIRE) ×12 IMPLANT
GUIDEWIRE VIPER BLUNT 1.45DIA (WIRE) ×3 IMPLANT
HEMOSTAT SURGICEL 2X14 (HEMOSTASIS) IMPLANT
INSERT FOGARTY 61MM (MISCELLANEOUS) IMPLANT
INSERT FOGARTY SM (MISCELLANEOUS) IMPLANT
INTRAOP MONITOR FEE IMPULS NCS (MISCELLANEOUS) ×2
INTRAOP MONITOR FEE IMPULSE (MISCELLANEOUS) ×1
IV CATH 14GX2 1/4 (CATHETERS) ×3 IMPLANT
KIT BASIN OR (CUSTOM PROCEDURE TRAY) ×3 IMPLANT
KIT DILATOR XLIF 5 (KITS) ×2 IMPLANT
KIT NEEDLE NVM5 EMG ELECT (KITS) ×2 IMPLANT
KIT NEEDLE NVM5 EMG ELECTRODE (KITS) ×1
KIT POSITION SURG JACKSON T1 (MISCELLANEOUS) ×3 IMPLANT
KIT ROOM TURNOVER OR (KITS) ×9 IMPLANT
KIT SURGICAL ACCESS MAXCESS 4 (KITS) ×3 IMPLANT
KIT XLIF (KITS) ×1
LIQUID BAND (GAUZE/BANDAGES/DRESSINGS) IMPLANT
LOOP VESSEL MAXI BLUE (MISCELLANEOUS) IMPLANT
LOOP VESSEL MINI RED (MISCELLANEOUS) IMPLANT
MARKER SKIN DUAL TIP RULER LAB (MISCELLANEOUS) ×6 IMPLANT
NDL SAFETY ECLIPSE 18X1.5 (NEEDLE) ×2 IMPLANT
NEEDLE 22X1 1/2 (OR ONLY) (NEEDLE) ×3 IMPLANT
NEEDLE HYPO 18GX1.5 SHARP (NEEDLE) ×1
NEEDLE HYPO 25GX1X1/2 BEV (NEEDLE) ×3 IMPLANT
NEEDLE JAMSHIDI VIPER (NEEDLE) ×9 IMPLANT
NEEDLE SPNL 18GX3.5 QUINCKE PK (NEEDLE) ×6 IMPLANT
NS IRRIG 1000ML POUR BTL (IV SOLUTION) ×3 IMPLANT
OIL CARTRIDGE MAESTRO DRILL (MISCELLANEOUS)
PACK LAMINECTOMY ORTHO (CUSTOM PROCEDURE TRAY) ×3 IMPLANT
PACK UNIVERSAL I (CUSTOM PROCEDURE TRAY) ×6 IMPLANT
PAD ARMBOARD 7.5X6 YLW CONV (MISCELLANEOUS) ×27 IMPLANT
PATTIES SURGICAL .5 X1 (DISPOSABLE) ×3 IMPLANT
PATTIES SURGICAL .5X1.5 (GAUZE/BANDAGES/DRESSINGS) ×3 IMPLANT
PLATE DECADE XLIP 2H SZ12 (Plate) ×3 IMPLANT
PUTTY BONE DBX 2.5 MIS (Bone Implant) ×3 IMPLANT
PUTTY BONE DBX 5CC MIX (Putty) ×3 IMPLANT
ROD VIPER II LORDOSED 5.5X40 (Rod) ×3 IMPLANT
SCREW 45MM (Screw) ×1 IMPLANT
SCREW SET SINGLE INNER MIS (Screw) ×12 IMPLANT
SCREW VIPER X-TAB 6.0X40 (Screw) ×6 IMPLANT
SCREW X-TAB VIPER 6X30 (Screw) ×3 IMPLANT
SPONGE GAUZE 4X4 12PLY STER LF (GAUZE/BANDAGES/DRESSINGS) ×3 IMPLANT
SPONGE INTESTINAL PEANUT (DISPOSABLE) ×9 IMPLANT
SPONGE LAP 18X18 X RAY DECT (DISPOSABLE) IMPLANT
SPONGE LAP 4X18 X RAY DECT (DISPOSABLE) IMPLANT
SPONGE SURGIFOAM ABS GEL 100 (HEMOSTASIS) ×6 IMPLANT
STAPLER VISISTAT (STAPLE) IMPLANT
STAPLER VISISTAT 35W (STAPLE) ×3 IMPLANT
STRIP CLOSURE SKIN 1/2X4 (GAUZE/BANDAGES/DRESSINGS) ×6 IMPLANT
SURGIFLO W/THROMBIN 8M KIT (HEMOSTASIS) IMPLANT
SUT MNCRL AB 4-0 PS2 18 (SUTURE) ×6 IMPLANT
SUT PDS AB 1 CTX 36 (SUTURE) ×6 IMPLANT
SUT PROLENE 4 0 RB 1 (SUTURE)
SUT PROLENE 4-0 RB1 .5 CRCL 36 (SUTURE) IMPLANT
SUT PROLENE 5 0 C 1 24 (SUTURE) IMPLANT
SUT PROLENE 5 0 CC1 (SUTURE) IMPLANT
SUT PROLENE 6 0 C 1 30 (SUTURE) IMPLANT
SUT PROLENE 6 0 CC (SUTURE) IMPLANT
SUT SILK 0 TIES 10X30 (SUTURE) IMPLANT
SUT SILK 2 0 TIES 10X30 (SUTURE) ×3 IMPLANT
SUT SILK 2 0SH CR/8 30 (SUTURE) IMPLANT
SUT SILK 3 0 TIES 10X30 (SUTURE) ×3 IMPLANT
SUT SILK 3 0SH CR/8 30 (SUTURE) IMPLANT
SUT VIC AB 0 CT1 18XCR BRD 8 (SUTURE) ×4 IMPLANT
SUT VIC AB 0 CT1 27 (SUTURE) ×1
SUT VIC AB 0 CT1 27XBRD ANBCTR (SUTURE) ×2 IMPLANT
SUT VIC AB 0 CT1 8-18 (SUTURE) ×2
SUT VIC AB 1 CT1 18XCR BRD 8 (SUTURE) ×4 IMPLANT
SUT VIC AB 1 CT1 27 (SUTURE) ×2
SUT VIC AB 1 CT1 27XBRD ANBCTR (SUTURE) ×4 IMPLANT
SUT VIC AB 1 CT1 8-18 (SUTURE) ×2
SUT VIC AB 1 CTX 36 (SUTURE) ×2
SUT VIC AB 1 CTX36XBRD ANBCTR (SUTURE) ×4 IMPLANT
SUT VIC AB 2-0 CT2 18 VCP726D (SUTURE) ×6 IMPLANT
SUT VIC AB 2-0 CTB1 (SUTURE) ×3 IMPLANT
SUT VIC AB 3-0 SH 27 (SUTURE) ×1
SUT VIC AB 3-0 SH 27X BRD (SUTURE) ×2 IMPLANT
SYR 20CC LL (SYRINGE) ×3 IMPLANT
SYR BULB IRRIGATION 50ML (SYRINGE) ×3 IMPLANT
SYR CONTROL 10ML LL (SYRINGE) ×6 IMPLANT
SYR TB 1ML LUER SLIP (SYRINGE) ×3 IMPLANT
TABS EXTENSION VIPER 6X35 (Neuro Prosthesis/Implant) ×2 IMPLANT
TAP CANN VIPER2 DL 5.0 (TAP) ×6 IMPLANT
TAPE CLOTH SURG 4X10 WHT LF (GAUZE/BANDAGES/DRESSINGS) ×3 IMPLANT
TOWEL OR 17X24 6PK STRL BLUE (TOWEL DISPOSABLE) ×3 IMPLANT
TOWEL OR 17X26 10 PK STRL BLUE (TOWEL DISPOSABLE) ×3 IMPLANT
TRAY FOLEY CATH 16FR SILVER (SET/KITS/TRAYS/PACK) ×3 IMPLANT
TRAY FOLEY CATH 16FRSI W/METER (SET/KITS/TRAYS/PACK) IMPLANT
WATER STERILE IRR 1000ML POUR (IV SOLUTION) ×3 IMPLANT
X-TABS VIPER 6X35 (Neuro Prosthesis/Implant) ×3 IMPLANT
YANKAUER SUCT BULB TIP NO VENT (SUCTIONS) ×6 IMPLANT

## 2014-10-22 NOTE — Anesthesia Procedure Notes (Signed)
Procedure Name: Intubation Date/Time: 10/22/2014 7:40 AM Performed by: Kyung Rudd Pre-anesthesia Checklist: Patient identified, Emergency Drugs available, Suction available, Patient being monitored and Timeout performed Patient Re-evaluated:Patient Re-evaluated prior to inductionOxygen Delivery Method: Circle system utilized Preoxygenation: Pre-oxygenation with 100% oxygen Intubation Type: IV induction Laryngoscope Size: Mac and 3 Grade View: Grade I Tube type: Oral Tube size: 7.0 mm Number of attempts: 1 Airway Equipment and Method: Stylet and LTA kit utilized Placement Confirmation: ETT inserted through vocal cords under direct vision,  positive ETCO2 and breath sounds checked- equal and bilateral Secured at: 21 cm Tube secured with: Tape Dental Injury: Teeth and Oropharynx as per pre-operative assessment

## 2014-10-22 NOTE — Anesthesia Postprocedure Evaluation (Signed)
  Anesthesia Post-op Note  Patient: Emily Gay  Procedure(s) Performed: Procedure(s) (LRB): POSTERIOR LUMBAR FUSION 1 LEVEL (N/A) ANTERIOR LATERAL LUMBAR FUSION 1 LEVEL (N/A)  Patient Location: PACU  Anesthesia Type: General  Level of Consciousness: awake and alert   Airway and Oxygen Therapy: Patient Spontanous Breathing  Post-op Pain: mild  Post-op Assessment: Post-op Vital signs reviewed, Patient's Cardiovascular Status Stable, Respiratory Function Stable, Patent Airway and No signs of Nausea or vomiting  Last Vitals:  Filed Vitals:   10/22/14 1415  BP:   Pulse: 100  Temp:   Resp: 19    Post-op Vital Signs: stable   Complications: No apparent anesthesia complications

## 2014-10-22 NOTE — Anesthesia Preprocedure Evaluation (Addendum)
Anesthesia Evaluation  Patient identified by MRN, date of birth, ID band Patient awake    Reviewed: Allergy & Precautions, H&P , NPO status , Patient's Chart, lab work & pertinent test results  History of Anesthesia Complications Negative for: history of anesthetic complications  Airway Mallampati: II  TM Distance: >3 FB Neck ROM: full    Dental no notable dental hx. (+) Teeth Intact   Pulmonary sleep apnea (does not use cPAP) ,    Pulmonary exam normal breath sounds clear to auscultation       Cardiovascular hypertension, Normal cardiovascular exam Rhythm:regular Rate:Normal     Neuro/Psych negative neurological ROS     GI/Hepatic Neg liver ROS, GERD  ,  Endo/Other  Morbid obesity  Renal/GU negative Renal ROS     Musculoskeletal  (+) Arthritis ,   Abdominal (+) + obese,   Peds  Hematology  (+) anemia ,   Anesthesia Other Findings   Reproductive/Obstetrics negative OB ROS                           Anesthesia Physical Anesthesia Plan  ASA: II  Anesthesia Plan: General   Post-op Pain Management:    Induction: Intravenous  Airway Management Planned: Oral ETT  Additional Equipment: Arterial line  Intra-op Plan:   Post-operative Plan: Extubation in OR  Informed Consent: I have reviewed the patients History and Physical, chart, labs and discussed the procedure including the risks, benefits and alternatives for the proposed anesthesia with the patient or authorized representative who has indicated his/her understanding and acceptance.   Dental Advisory Given  Plan Discussed with: Anesthesiologist, CRNA and Surgeon  Anesthesia Plan Comments: (GA with oral ETT Arterial Line 2 PIVs, if unable will need central line Multimodal analgesia including tylenol, opioids, steroids and ketamine )       Anesthesia Quick Evaluation

## 2014-10-22 NOTE — Transfer of Care (Signed)
Immediate Anesthesia Transfer of Care Note  Patient: MANMEET ARZOLA  Procedure(s) Performed: Procedure(s) with comments: POSTERIOR LUMBAR FUSION 1 LEVEL (N/A) - Lumbar 4-5 Posterior spinal fusion with instrumentation ANTERIOR LATERAL LUMBAR FUSION 1 LEVEL (N/A)  Patient Location: PACU  Anesthesia Type:General  Level of Consciousness: sedated  Airway & Oxygen Therapy: Patient Spontanous Breathing and Patient connected to face mask oxygen  Post-op Assessment: Report given to RN and Post -op Vital signs reviewed and stable  Post vital signs: Reviewed and stable  Last Vitals:  Filed Vitals:   10/22/14 0629  BP: 126/61  Pulse: 80  Temp: 36.9 C  Resp: 18    Complications: No apparent anesthesia complications

## 2014-10-22 NOTE — Care Management (Signed)
Utilization review completed. Susan Brady, RN Case Manager [phone]. 

## 2014-10-22 NOTE — Op Note (Signed)
NAME:  Emily Gay, Emily Gay NO.:  [id number]  MEDICAL RECORD NO.:  [id number]  LOCATION:  3C04C                        FACILITY:  [facility]  PHYSICIAN:  Estill Bamberg, MD      DATE OF BIRTH:  Oct 12, 1970  DATE OF PROCEDURE:  10/22/2014                              OPERATIVE REPORT   PREOPERATIVE DIAGNOSES: 1. Grade 2 L4-5 spondylolisthesis. 2. Moderate to severe L4-5 spinal stenosis. 3. Low back and bilateral leg pain.  POSTOPERATIVE DIAGNOSES: 1. Grade 2 L4-5 spondylolisthesis. 2. Moderate to severe L4-5 spinal stenosis. 3. Low back and bilateral leg pain.  PROCEDURES: 1. Anterior lumbar interbody fusion via a right-sided direct lateral     approach, L4-5. 2. Placement of interbody device x1 (12 x 45 mm NuVasive interbody     spacer). 3. Placement of anterior instrumentation, L4-5. 4. Use of morselized allograft-DBX mix. 5. Posterior spinal fusion, L4-5. 6. Placement of posterior instrumentation L4-5 (6-mm screws).  SURGEON:  Estill Bamberg, MD.  ASSISTANTJason Coop, PA-C.  ANESTHESIA:  General endotracheal anesthesia.  COMPLICATIONS:  None.  DISPOSITION:  Stable.  ESTIMATED BLOOD LOSS:  Minimal.  INDICATIONS FOR SURGERY:  Briefly, Ms. Manzer is a very pleasant 44 year old female, who did initially present to me approximately 2 years ago with low back and bilateral leg pain.  Over the course of the last 2 years, her pain had been increasing and has become severe.  The pain in the legs was increased to standing and walking.  An MRI did reveal the findings noted above.  The patient did fail appropriate forms of conservative care and did continue to have ongoing pain.  We, therefore, did discuss proceeding with the procedure reflected above.  Of note, the initial plan was to proceed with an anterior lumbar fusion via a direct anterior approach, however, it was felt by the approach surgeon, they would be performing the approach, that does not safe to  proceed with an anterior approach, given the patient's body habitus.  We, therefore, did elect to proceed with the procedure noted above.  OPERATIVE DETAILS:  On October 22, 2014, the patient was brought to surgery and general endotracheal anesthesia was administered.  The patient was placed in the lateral decubitus position with the right side up.  The patient's hips and knees were appropriately flexed.  All bony prominences were padded appropriately.  The bed was then flexed slightly to help gain access to the L4-5 intervertebral space.  The region of the right flank was prepped and draped and a time-out procedure was performed and antibiotics were given.  I then made a transverse incision in line with the L4-5 intervertebral space.  Dissection was carried down to the external-internal oblique musculature.  The transversalis fascia was identified and entered.  The retroperitoneal space was noted and the peritoneum was bluntly swept anteriorly.  I then placed the initial dilator through the psoas muscle using neurologic monitoring.  I did also liberally use AP and lateral fluoroscopy.  I was pleased with the final resting position of the initial dilator.  Neurologic monitoring did support that there were no neurologic structures in the immediate vicinity of the initial dilator.  I then sequentially dilated.  A self- retaining retractor was then advanced over the dilators and secured into position and attached to the bed using a rigid arm.  Once the retractor was placed in the appropriate position, it was opened.  I then additionally performed neurologic monitoring in order to ensure that there were no neurologic structures in the vicinity of the exposure.  I then used a knife to perform an annulotomy.  I then performed a thorough L4-5 intervertebral diskectomy, nearing towards the posterior aspect of the disc.  Once the diskectomy and endplate preparation were complete, I placed a  series of trials and I did ultimately fill a 45 x 12 mm NuVasive interbody spacer implant with DBX mix.  The implant was then advanced across the intervertebral space in the usual fashion.  I was extremely pleased with the press-fit of the implant, and I was very pleased with the restoration of intervertebral height across the L4-5 space, and I was very pleased with the partial reduction of the spondylolisthesis.  I then chose a 2-hole anterior lumbar plate, which was placed over the right lateral aspect of the lumbar spine.  I then used an awl through the 2-hole plate to prepare the trajectory of the vertebral body screws.  Screws of the appropriate length were then advanced through the plate into the vertebral bodies.  I did note an excellent press-fit of the screws.  The screws were then locked to the plate.  The bed was then leveled and the plate was locked as well.  The wound was copiously irrigated.  I was extremely pleased with the final AP and lateral fluoroscopic images.  The fascia was then closed using #1 Vicryl.  The wound was then closed in layers using 0 Vicryl, followed by 2-0 Vicryl, followed by 3-0 Monocryl.  At this point, the patient was positioned prone on a well-padded flat Jackson bed with a spinal frame. I then made 2 paramedian incisions lateral to the L4 and L5 pedicles. The posterior elements and posterolateral gutter on the left were identified and exposed and decorticated.  The remainder of the allograft was packed along the posterior elements.  I then advanced Jamshidi needles across the L4 and L5 pedicles bilaterally, liberally using AP and lateral fluoroscopy.  Guidewires were advanced through the Jamshidi needles into the vertebral bodies.  I then used a 5-mm tap over the guidewires.  I did use triggered EMG to test each of the taps, and there was no tap that tested below 20 milliamps.  I then advanced a 6-mm screws of the appropriate length over the  guidewires bilaterally at L4 and at L5.  I was very pleased with the press-fit of the screws and the appearance of the AP and lateral fluoroscopic images.  I then secured a rod of the appropriate length over the Tulip heads of the screws bilaterally.  Caps were then placed and a final locking procedure was performed.  Again, I was very pleased with the AP and lateral fluoroscopic images.  The wounds were then irrigated and closed in layers using #1 Vicryl, followed by 0 Vicryl, followed by 2-0 Vicryl, followed by 3-0 Monocryl.  Benzoin and Steri-Strips were applied, followed by sterile dressing.  All instrument counts were correct at the termination of the procedure.  Of note, there was no abnormal sustained EMG activity noted throughout the entire surgery from start to finish.  Of note, Jason Coop, was my first assistant throughout the surgery, including both the anterior  and posterior portions of the procedure, and did aid in essential retraction, suctioning, and closure.     Estill Bamberg, MD     MD/MEDQ  D:  10/22/2014  T:  10/22/2014  Job:  [id number]  cc:   Quentin Mulling, PA-C

## 2014-10-23 ENCOUNTER — Encounter (HOSPITAL_COMMUNITY): Payer: Self-pay | Admitting: Orthopedic Surgery

## 2014-10-23 MED ORDER — HYDROCODONE-ACETAMINOPHEN 5-325 MG PO TABS
2.0000 | ORAL_TABLET | ORAL | Status: DC | PRN
  Administered 2014-10-23 – 2014-10-24 (×4): 2 via ORAL
  Filled 2014-10-23 (×4): qty 2

## 2014-10-23 MED ORDER — METHOCARBAMOL 500 MG PO TABS
500.0000 mg | ORAL_TABLET | Freq: Four times a day (QID) | ORAL | Status: DC | PRN
  Administered 2014-10-23 – 2014-10-24 (×2): 500 mg via ORAL
  Filled 2014-10-23 (×2): qty 1

## 2014-10-23 MED ORDER — HYDROMORPHONE HCL 1 MG/ML IJ SOLN
1.0000 mg | Freq: Once | INTRAMUSCULAR | Status: AC
  Administered 2014-10-23: 1 mg via INTRAMUSCULAR
  Filled 2014-10-23: qty 1

## 2014-10-23 NOTE — Progress Notes (Signed)
    Patient doing well Patient report moderate LBP, as expected Denies leg pain Has been ambulating Had bilateral hand pain, left hand pain resolved, R hand pain persists  Physical Exam: Filed Vitals:   10/23/14 0438  BP: 116/48  Pulse: 87  Temp: 98.3 F (36.8 C)  Resp: 18    Dressing in place NVI + pain in R hand with grip and fingerflexion  POD #1 s/p A/P L4/5 fusion for severe stenosis and a grade 2 spondylolisthesis  - up with PT/OT, encourage ambulation - Percocet for pain, Valium for muscle spasms - likely d/c home Saturday - brace when up and ambulating - hand pain unclear, likely due to positioning, will keep an eye on this

## 2014-10-23 NOTE — Progress Notes (Addendum)
Occupational Therapy Evaluation Patient Details Name: Emily Gay MRN: [id number] DOB: 1970/05/31 Today's Date: 10/23/2014    History of Present Illness Pt is a 44 y/o female admitted s/p A/P L4/5 fusion for severe stenosis and a grade 2 spondylolisthesis on 10/22/14.   Clinical Impression   PTA, pt independent with ADL and mobility.Pt sleepy during OT session. States she did not sleep well last night. Began education regarding use of compensatory techniques, AE/DME for ADL and functional mobility. Will plan to see in am prior to D/C to complete education regarding back precautions/ADL with use of AE/DME, in addition to managing TLSO in order to facilitate safe D/C home with husband, who can provdie 24/7 A. Pt complaining of R hand weaknee/numbenss. Pt has a history of carpal tunnel disorder. Recommend ice to R wrist and reassess in am.     Follow Up Recommendations  No OT follow up;Supervision/Assistance - 24 hour (initially)    Equipment Recommendations  3 in 1 bedside comode    Recommendations for Other Services       Precautions / Restrictions Precautions Precautions: Fall;Back Precaution Booklet Issued: Yes (comment) Precaution Comments: REviewed 3/3 back precautions with pt and husband Required Braces or Orthoses: Spinal Brace Spinal Brace: Thoracolumbosacral orthotic Restrictions Weight Bearing Restrictions: No      Mobility Bed Mobility Overal bed mobility: Needs Assistance Bed Mobility: Rolling;Sidelying to Sit Rolling: Min guard Sidelying to sit: Min guard       General bed mobility comments: pt up in recliner  Transfers Overall transfer level: Needs assistance Equipment used: Straight cane Transfers: Sit to/from Stand Sit to Stand: Supervision         General transfer comment: no physical assist    Balance Overall balance assessment: Needs assistance Sitting-balance support: Feet supported;No upper extremity supported Sitting balance-Leahy Scale:  Good     Standing balance support: No upper extremity supported;During functional activity Standing balance-Leahy Scale: Fair                              ADL Overall ADL's : Needs assistance/impaired     Grooming: Supervision/safety;Set up   Upper Body Bathing: Minimal assitance   Lower Body Bathing: Moderate assistance;Sit to/from stand   Upper Body Dressing : Moderate assistance   Lower Body Dressing: Moderate assistance;Sit to/from stand   Toilet Transfer: Lawyer and Hygiene: Moderate assistance       Functional mobility during ADLs: Supervision/safety;Cueing for safety;Cueing for sequencing General ADL Comments: Began educating pt on use of compensatory techniques, AE and DME afor ADL and functional mobility for ADL. Pt is usually able to cross legs but unable to at this time. Began educating on use of AE. Husband has multiple questions regarding ADL and mobility. Husband asking about use of RW with a seat. Recommeded for pt to focus on only using RW if needed. Will further assess in am     Vision     Perception     Praxis      Pertinent Vitals/Pain Pain Assessment: 0-10 Pain Score: 6  Pain Location: back Pain Descriptors / Indicators: Aching Pain Intervention(s): Limited activity within patient's tolerance;Monitored during session;Patient requesting pain meds-RN notified     Hand Dominance Right   Extremity/Trunk Assessment Upper Extremity Assessment Upper Extremity Assessment: RUE deficits/detail RUE Deficits / Details: Pt reports continued R hand pain and difficulty gripping since the surgery.  RUE: Unable to fully assess due  to pain RUE Coordination: decreased fine motor (unable to make full fist R hand; c/o numbness tingling midof)   Lower Extremity Assessment Lower Extremity Assessment: Generalized weakness   Cervical / Trunk Assessment Cervical / Trunk Assessment: Normal   Communication  Communication Communication: No difficulties   Cognition Arousal/Alertness: Awake/alert Behavior During Therapy: WFL for tasks assessed/performed Overall Cognitive Status: Within Functional Limits for tasks assessed       Memory: Decreased recall of precautions             General Comments       Exercises       Shoulder Instructions      Home Living Family/patient expects to be discharged to:: Private residence Living Arrangements: Spouse/significant other Available Help at Discharge: Family;Friend(s);Available 24 hours/day Type of Home: House Home Access: Stairs to enter [employer] of Steps: 3 Entrance Stairs-Rails: None Home Layout: One level     Bathroom Shower/Tub: Chief Strategy Officer: Standard Bathroom Accessibility: Yes How Accessible: Accessible via walker Home Equipment: Cane - single point;Shower seat (Pt does not own a RW. These are her husband's)          Prior Functioning/Environment Level of Independence: Independent        Comments: husband assisted occasionally    OT Diagnosis: Generalized weakness;Acute pain   OT Problem List: Decreased strength;Decreased range of motion;Decreased activity tolerance;Decreased knowledge of use of DME or AE;Decreased knowledge of precautions;Impaired sensation;Obesity;Pain;Impaired UE functional use   OT Treatment/Interventions: Self-care/ADL training;DME and/or AE instruction;Therapeutic activities;Patient/family education    OT Goals(Current goals can be found in the care plan section) Acute Rehab OT Goals Patient Stated Goal: Return home  OT Goal Formulation: With patient/family Time For Goal Achievement: 10/30/14 Potential to Achieve Goals: Good  OT Frequency: Min 2X/week   Barriers to D/C:            Co-evaluation              End of Session Equipment Utilized During Treatment: Back brace Nurse Communication: Mobility status;Other (comment) (need for 3 in  1)  Activity Tolerance: Patient limited by lethargy Patient left: in chair   Time: 0912-0943 OT Time Calculation (min): 31 min Charges:  OT General Charges $OT Visit: 1 Procedure OT Evaluation $Initial OT Evaluation Tier I: 1 Procedure OT Treatments $Self Care/Home Management : 8-22 mins G-Codes:    Mickal Meno,HILLARY 2014/11/13, 10:44 AM   Luisa Dago, OTR/L  [phone] 11/13/2014

## 2014-10-23 NOTE — Evaluation (Signed)
Physical Therapy Evaluation Patient Details Name: Emily Gay MRN: [id number] DOB: Mar 15, 1970 Today's Date: 10/23/2014   History of Present Illness  Pt is a 44 y/o female admitted s/p A/P L4/5 fusion for severe stenosis and a grade 2 spondylolisthesis on 10/22/14.  Clinical Impression  Pt admitted with above diagnosis. Pt currently with functional limitations due to the deficits listed below (see PT Problem List). At the time of PT eval pt was able to perform transfers and ambulation with increased time and close guard for safety. Pt did well without AD, however discussed use of SPC or rollator for community ambulation and energy conservation with pt and husband. Pt will benefit from skilled PT to increase their independence and safety with mobility to allow discharge to the venue listed below.       Follow Up Recommendations Outpatient PT;Supervision for mobility/OOB (When appropriate per post-op protocol)    Equipment Recommendations  None recommended by PT    Recommendations for Other Services       Precautions / Restrictions Precautions Precautions: Fall;Back Precaution Booklet Issued: Yes (comment) Precaution Comments: Discussed 3/3 back precautions with pt and husband Required Braces or Orthoses: Spinal Brace Spinal Brace: Thoracolumbosacral orthotic Restrictions Weight Bearing Restrictions: No      Mobility  Bed Mobility Overal bed mobility: Needs Assistance Bed Mobility: Rolling;Sidelying to Sit Rolling: Min guard Sidelying to sit: Min guard       General bed mobility comments: Slow and guarded movement. Pt was able to transition to EOB with guarding and heavy use of bed rails.   Transfers Overall transfer level: Needs assistance Equipment used: Straight cane Transfers: Sit to/from Stand Sit to Stand: Min guard         General transfer comment: Hands-on guarding for safety. Pt was able to stand with use of husband's cane initially. upon return to room and  stand>sit, pt did not use an AD. Increased time to gain and maintain uprighrt posture.   Ambulation/Gait Ambulation/Gait assistance: Min guard Ambulation Distance (Feet): 450 Feet Assistive device: Straight cane;None Gait Pattern/deviations: Step-through pattern;Decreased stride length;Wide base of support Gait velocity: Decreased Gait velocity interpretation: Below normal speed for age/gender General Gait Details: Pt ambulated with SPC initially and then attempted without AD. Pt was able to maintain good balance during ambulation but overall moved very slow and guarded.   Stairs Stairs: Yes Stairs assistance: Min guard Stair Management: One rail Right;Step to pattern;Forwards Number of Stairs: 3 General stair comments: VC's for sequencing and technique.   Wheelchair Mobility    Modified Rankin (Stroke Patients Only)       Balance Overall balance assessment: Needs assistance Sitting-balance support: Feet supported;No upper extremity supported Sitting balance-Leahy Scale: Fair     Standing balance support: No upper extremity supported;During functional activity Standing balance-Leahy Scale: Fair                               Pertinent Vitals/Pain Pain Assessment: 0-10 Pain Score: 7  Pain Location: Back and R hip Pain Descriptors / Indicators: Operative site guarding;Grimacing;Guarding Pain Intervention(s): Limited activity within patient's tolerance;Monitored during session;Repositioned    Home Living Family/patient expects to be discharged to:: Private residence Living Arrangements: Spouse/significant other Available Help at Discharge: Family;Friend(s);Available 24 hours/day Type of Home: House Home Access: Stairs to enter Entrance Stairs-Rails: None Entrance Stairs-Number of Steps: 3 Home Layout: One level Home Equipment: Walker - 2 wheels;Walker - 4 wheels;Cane - single point;Shower seat  Prior Function Level of Independence: Independent                Hand Dominance   Dominant Hand: Right    Extremity/Trunk Assessment   Upper Extremity Assessment: RUE deficits/detail RUE Deficits / Details: Pt reports continued R hand pain and difficulty gripping since the surgery.          Lower Extremity Assessment: Generalized weakness      Cervical / Trunk Assessment: Normal  Communication   Communication: No difficulties  Cognition Arousal/Alertness: Awake/alert Behavior During Therapy: WFL for tasks assessed/performed Overall Cognitive Status: Within Functional Limits for tasks assessed       Memory: Decreased recall of precautions              General Comments      Exercises        Assessment/Plan    PT Assessment Patient needs continued PT services  PT Diagnosis Difficulty walking;Acute pain   PT Problem List Decreased strength;Decreased range of motion;Decreased activity tolerance;Decreased balance;Decreased mobility;Decreased knowledge of use of DME;Decreased safety awareness;Decreased knowledge of precautions;Pain  PT Treatment Interventions DME instruction;Gait training;Stair training;Functional mobility training;Therapeutic activities;Therapeutic exercise;Neuromuscular re-education;Patient/family education   PT Goals (Current goals can be found in the Care Plan section) Acute Rehab PT Goals Patient Stated Goal: Return home  PT Goal Formulation: With patient/family Time For Goal Achievement: 10/30/14 Potential to Achieve Goals: Good    Frequency Min 5X/week   Barriers to discharge        Co-evaluation               End of Session Equipment Utilized During Treatment: Back brace Activity Tolerance: Patient tolerated treatment well Patient left: in chair;with call bell/phone within reach;with family/visitor present Nurse Communication: Mobility status         Time: 6213-0865 PT Time Calculation (min) (ACUTE ONLY): 46 min   Charges:   PT Evaluation $Initial PT Evaluation  Tier I: 1 Procedure PT Treatments $Gait Training: 8-22 mins $Therapeutic Activity: 8-22 mins   PT G Codes:        Conni Slipper 28-Oct-2014, 10:19 AM   Conni Slipper, PT, DPT Acute Rehabilitation Services Pager: [phone]

## 2014-10-24 MED ORDER — HYDROCODONE-ACETAMINOPHEN 5-325 MG PO TABS: 1.0000 | ORAL_TABLET | ORAL | Status: DC | PRN

## 2014-10-24 MED ORDER — DIAZEPAM 5 MG PO TABS: 5.0000 mg | ORAL_TABLET | Freq: Four times a day (QID) | ORAL | Status: DC | PRN

## 2014-10-24 MED ORDER — MAGNESIUM CITRATE PO SOLN
1.0000 | Freq: Once | ORAL | Status: AC
  Administered 2014-10-24: 1 via ORAL
  Filled 2014-10-24: qty 296

## 2014-10-24 NOTE — Progress Notes (Signed)
Physical Therapy Treatment Patient Details Name: Emily Gay MRN: [id number] DOB: October 25, 1970 Today's Date: 10/24/2014    History of Present Illness Pt is a 44 y/o female admitted s/p A/P L4/5 fusion for severe stenosis and a grade 2 spondylolisthesis on 10/22/14.    PT Comments    Pt progressing towards physical therapy goals. Reinforced back precautions and safety with mobility. Pt reports her pain is better controlled than earlier this morning and is hopeful for d/c home today. Pt states husband is concerned due to constipation but pt feels comfortable managing physically at home. Will continue to follow.   Follow Up Recommendations  Outpatient PT;Supervision for mobility/OOB (When appropriate per post-op protocol)     Equipment Recommendations  None recommended by PT    Recommendations for Other Services       Precautions / Restrictions Precautions Precautions: Fall;Back Precaution Booklet Issued: Yes (comment) Precaution Comments: Reviewed 3/3 back precautions with pt Required Braces or Orthoses: Spinal Brace Spinal Brace: Thoracolumbosacral orthotic Restrictions Weight Bearing Restrictions: No    Mobility  Bed Mobility Overal bed mobility: Needs Assistance Bed Mobility: Rolling;Sidelying to Sit;Sit to Sidelying Rolling: Supervision Sidelying to sit: Min guard     Sit to sidelying: Min guard General bed mobility comments: Reinforced log roll technique. Pt wanting to crawl into bed on all fours and "collapse" into a sidelying position. Strongly recommended that pt continue to use log roll technique for in/out of bed. Practiced with min guard assist only for LE elevation into bed. Pt states she feels comfortable with log roll at end of session.   Transfers Overall transfer level: Needs assistance Equipment used: None Transfers: Sit to/from Stand Sit to Stand: Supervision Stand pivot transfers: Supervision       General transfer comment: Pt was able to power-up to  full standing with a slow and guarded movement. No unsteadiness noted and no physical assist required.   Ambulation/Gait Ambulation/Gait assistance: Supervision Ambulation Distance (Feet): 450 Feet Assistive device: None Gait Pattern/deviations: Step-through pattern;Decreased stride length Gait velocity: Decreased Gait velocity interpretation: Below normal speed for age/gender General Gait Details: Pt ambulating slow and guarded but overall improved from initial evaluation. No assist required; supervision for safety.   Stairs Stairs: Yes Stairs assistance: Min guard Stair Management: One rail Left;Step to pattern;Forwards Number of Stairs: 3 General stair comments: Light guard provided. Good sequencing and technique.   Wheelchair Mobility    Modified Rankin (Stroke Patients Only)       Balance Overall balance assessment: Needs assistance Sitting-balance support: Feet supported;No upper extremity supported Sitting balance-Leahy Scale: Good     Standing balance support: No upper extremity supported Standing balance-Leahy Scale: Fair                      Cognition Arousal/Alertness: Awake/alert Behavior During Therapy: WFL for tasks assessed/performed Overall Cognitive Status: Within Functional Limits for tasks assessed       Memory: Decreased recall of precautions              Exercises      General Comments        Pertinent Vitals/Pain Pain Assessment: 0-10 Pain Score: 4  Pain Location: back and hip Pain Descriptors / Indicators: Operative site guarding Pain Intervention(s): Limited activity within patient's tolerance;Monitored during session;Repositioned    Home Living                      Prior Function  PT Goals (current goals can now be found in the care plan section) Acute Rehab PT Goals Patient Stated Goal: Return home  PT Goal Formulation: With patient/family Time For Goal Achievement: 10/30/14 Potential to  Achieve Goals: Good Progress towards PT goals: Progressing toward goals    Frequency  Min 5X/week    PT Plan Current plan remains appropriate    Co-evaluation             End of Session Equipment Utilized During Treatment: Back brace Activity Tolerance: Patient tolerated treatment well Patient left: in chair;with call bell/phone within reach;with family/visitor present     Time: 7846-9629 PT Time Calculation (min) (ACUTE ONLY): 18 min  Charges:  $Gait Training: 8-22 mins                    G Codes:      Conni Slipper 11-01-2014, 10:57 AM   Conni Slipper, PT, DPT Acute Rehabilitation Services Pager: [phone]

## 2014-10-24 NOTE — Progress Notes (Signed)
Occupational Therapy Treatment Patient Details Name: Emily Gay MRN: [id number] DOB: Jan 27, 1970 Today's Date: 10/24/2014    History of present illness Pt is a 44 y/o female admitted s/p A/P L4/5 fusion for severe stenosis and a grade 2 spondylolisthesis on 10/22/14.   OT comments  Pt. Progressing well with acute OT goals.  Pt. And spouse able to return safe demo of donning brace, toileting, LB ADLS, and shower plans.  State pain management is her only issue at this time and had no other "physical" questions or concerns.  Will alert OTR/L to sign off.  Pt. And spouse had no further questions for OT.    Follow Up Recommendations  No OT follow up;Supervision/Assistance - 24 hour    Equipment Recommendations  3 in 1 bedside comode    Recommendations for Other Services      Precautions / Restrictions Precautions Precautions: Fall;Back Precaution Comments: REviewed 3/3 back precautions with pt and husband Required Braces or Orthoses: Spinal Brace Spinal Brace: Thoracolumbosacral orthotic       Mobility Bed Mobility               General bed mobility comments: pt. up in recliner, did have questions regarding in/out of bed as she reports she usually "crawls" in on all fours...discussed with pt. and PT that PT would review bed mobility during their session to dertermine if pts. tech. was safe and would maintain her back precautions or not. (pt. had just gottent comfortable so we did not attempt)    Transfers Overall transfer level: Needs assistance   Transfers: Sit to/from Stand;Stand Pivot Transfers Sit to Stand: Supervision Stand pivot transfers: Supervision       General transfer comment: no physical assist    Balance                                   ADL Overall ADL's : Needs assistance/impaired     Grooming: Wash/dry hands;Supervision/safety;Standing         Lower Body Bathing Details (indicate cue type and reason): has A/E, reports she will  sponge bathe initially and use dry shampoo Upper Body Dressing : Moderate assistance;Sitting;Standing Upper Body Dressing Details (indicate cue type and reason): huband able to don brace without assistance or cues.  pt. able to adjust side straps to her desired comfort   Lower Body Dressing Details (indicate cue type and reason): has A/E denies need for review of use Toilet Transfer: Supervision/safety;Regular Toilet;Grab bars Toilet Transfer Details (indicate cue type and reason): states she has a wall and vanity she can use to lower herself at home [place] and Hygiene: Supervision/safety;Sitting/lateral lean;Sit to/from Nurse, children's Details (indicate cue type and reason): reports having a shower stall with small ledge and will be purchasing hand held adaptation for it.  declines need to review stepping over ledge has a shower chair to sit on Functional mobility during ADLs: Supervision/safety General ADL Comments: pt. and spouse report they had no further questions regarding basic adls (ie: toileting, shower stall tranfer, or LB dressing).  reviewed tech. for performing peri care with tongs if needed.        Vision                     Perception     Praxis      Cognition   Behavior During Therapy: [facility] for tasks assessed/performed  Overall Cognitive Status: Within Functional Limits for tasks assessed                       Extremity/Trunk Assessment               Exercises     Shoulder Instructions       General Comments      Pertinent Vitals/ Pain       Pain Assessment: 0-10 Pain Score: 5  Pain Location: back Pain Descriptors / Indicators: Aching Pain Intervention(s): Monitored during session;Repositioned;Patient requesting pain meds-RN notified (pt. not due for meds at this time)  Home Living                                          Prior Functioning/Environment               Frequency Min 2X/week     Progress Toward Goals  OT Goals(current goals can now be found in the care plan section)  Progress towards OT goals: Goals met/education completed, patient discharged from [facility] Discharge plan remains appropriate    Co-evaluation                 End of Session Equipment Utilized During Treatment: Back brace   Activity Tolerance Patient tolerated treatment well   Patient Left in chair;with call bell/phone within reach;with family/visitor present   Nurse Communication          Time: 0700-0730 OT Time Calculation (min): 30 min  Charges: OT General Charges $OT Visit: 1 Procedure OT Treatments $Self Care/Home Management : 23-37 mins  Janice Coffin, COTA/L 10/24/2014, 7:51 AM

## 2014-10-24 NOTE — Progress Notes (Addendum)
Pt and family given D/C instructions with Rx's, verbal understanding was provided. Pt's incisions are clean and dry with no sign of infection. Pt's IV was removed prior to D/C. CM called to order bariatric 3-n-1 per Pt request. Pt waited and have not heard back from [facility] about delivery, Pt requested to be discharge anyway. Informed Pt that I will speak to [facility] on Monday about delivering in to her home. Pt D/C'd home via wheelchair per MD order. Pt is stable @ D/C and has no other needs at this time. Rema Fendt, RN

## 2014-10-24 NOTE — Progress Notes (Signed)
    Patient doing well, +flatus, no BM yet Patient report moderate LBP, as expected--pt doing better on hydrocodone than oxycodone Denies leg pain Has been ambulating Had bilateral hand pain--resolved now  Physical Exam: Filed Vitals:   10/24/14 0805  BP: 108/61  Pulse: 90  Temp: 98.4 F (36.9 C)  Resp: 18    Dressing in place NVI R/L hand not swollen  POD #2 s/p A/P L4/5 fusion for severe stenosis and a grade 2 spondylolisthesis  - cleared for d/c by PT/OT - Norco for pain, Valium for muscle spasms - D/c today - brace when up and ambulating - hand pain resolved

## 2014-10-26 ENCOUNTER — Encounter (HOSPITAL_COMMUNITY): Payer: Self-pay | Admitting: Orthopedic Surgery

## 2014-10-26 LAB — TYPE AND SCREEN
ABO/RH(D): O POS
Antibody Screen: NEGATIVE
UNIT DIVISION: 0
Unit division: 0

## 2014-10-27 ENCOUNTER — Encounter (HOSPITAL_COMMUNITY): Payer: Self-pay | Admitting: Orthopedic Surgery

## 2014-11-04 NOTE — Discharge Summary (Signed)
Patient ID: Emily Gay MRN: [id number] DOB/AGE: 42/08/1970 42 y.o.  Admit date: 10/22/2014 Discharge date: 10/24/2014  Admission Diagnoses:  Active Problems:   Radiculopathy   Discharge Diagnoses:  Same  Past Medical History  Diagnosis Date  . Allergic rhinitis   . Hypertension   . Obesity   . Chronic low back pain   . 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 OSA on CPAP     does not use CPAP  . GERD (gastroesophageal reflux disease)   . Headache     migraine  . Arthritis   . Anemia     Surgeries: Procedure(s): POSTERIOR LUMBAR FUSION 1 LEVEL L4-5 ANTERIOR LATERAL LUMBAR FUSION 1 LEVEL L4-5 on 10/22/2014   Consultants:  None  Discharged Condition: Improved  Hospital Course: Emily Gay Emily Gay is an 44 y.o. female who was admitted 10/22/2014 for operative treatment of radiculopathy. Patient has severe unremitting pain that affects sleep, daily activities, and work/hobbies. After pre-op clearance the patient was taken to the operating room on 10/22/2014 and underwent  Procedure(s): POSTERIOR LUMBAR FUSION 1 LEVEL L4-5 ANTERIOR LATERAL LUMBAR FUSION 1 LEVEL L4-5  Patient was given perioperative antibiotics:  Anti-infectives    Start     Dose/Rate Route Frequency Ordered Stop   10/22/14 1900  ceFAZolin (ANCEF) IVPB 1 g/50 mL premix     1 g 100 mL/hr over 30 Minutes Intravenous Every 8 hours 10/22/14 1531 10/23/14 0157   10/22/14 0700  ceFAZolin (ANCEF) 3 g in dextrose 5 % 50 mL IVPB     3 g 160 mL/hr over 30 Minutes Intravenous To [facility] 10/21/14 0935 10/22/14 1158       Patient was given sequential compression devices, early ambulation to prevent DVT.  Patient benefited maximally from hospital stay and there were no complications.    Recent vital signs: BP 108/61 mmHg  Pulse 90  Temp(Src) 98.4 F (36.9 C) (Oral)  Resp 18  Ht 5\' 4"  (1.626 Gay)  Wt 121.649 kg (268 lb 3 oz)  BMI 46.01 kg/m2  SpO2 98%  LMP 10/13/2014  Discharge Medications:       Medication List    STOP taking these medications        acetaminophen 500 MG tablet  Commonly known as:  TYLENOL      TAKE these medications        buPROPion 300 MG 24 hr tablet  Commonly known as:  WELLBUTRIN XL  TAKE 1 TABLET (300 MG TOTAL) BY MOUTH EVERY MORNING.     diazepam 5 MG tablet  Commonly known as:  VALIUM  Take 1 tablet (5 mg total) by mouth every 6 (six) hours as needed for muscle spasms.     fluticasone 50 MCG/ACT nasal spray  Commonly known as:  FLONASE  Place 2 sprays into both nostrils at bedtime.     HYDROcodone-acetaminophen 5-325 MG tablet  Commonly known as:  NORCO/VICODIN  Take 1-2 tablets by mouth every 4 (four) hours as needed for moderate pain.     levocetirizine 5 MG tablet  Commonly known as:  XYZAL  TAKE 1 TABLET BY MOUTH DAILY     LYRICA 75 MG capsule  Generic drug:  pregabalin  TAKE 1 CAPSULE BY MOUTH 3 TIMES DAILY AND 2 CAPSULES AT NIGHT     methocarbamol 500 MG tablet  Commonly known as:  ROBAXIN  TAKE 1 TABLET (500 MG TOTAL) BY MOUTH 3 (THREE) TIMES DAILY AS NEEDED FOR MUSCLE SPASMS.     multivitamin  capsule  Take 1 capsule by mouth daily.     pantoprazole 40 MG tablet  Commonly known as:  PROTONIX  TAKE 1 TABLET (40 MG TOTAL) BY MOUTH DAILY.     VITAMIN D PO  Take 2,000 Units by mouth daily.        Diagnostic Studies: Dg Chest 2 View  10/14/2014  CLINICAL DATA:  Pre-op for lumbar spine surgery.  Initial encounter. EXAM: CHEST  2 VIEW COMPARISON:  Radiographs 03/01/2009. FINDINGS: The heart size and mediastinal contours are normal. The lungs are clear. There is no pleural effusion or pneumothorax. No acute osseous findings are identified. IMPRESSION: Stable chest.  No active cardiopulmonary process. Electronically Signed   By: Carey Bullocks Gay.D.   On: 10/14/2014 16:09   Dg Lumbar Spine 2-3 Views  10/22/2014  CLINICAL DATA:  Spinal stenosis and spondylolisthesis. EXAM: DG C-ARM GT 120 MIN; LUMBAR SPINE - 2-3 VIEW FLUOROSCOPY  TIME:  Fluoroscopy Time (in minutes and seconds): 5 minutes 46 seconds COMPARISON:  MRI dated 09/15/2014 FINDINGS: AP and lateral C-arm images demonstrate the patient has undergone interbody and posterior fusion at L4-5. Pedicle screws, right lateral fixation screws and interbody fusion device appear in good position. Improved spondylolisthesis. IMPRESSION: Interbody and posterior and lateral fusion performed at L4-5 with reduction of the spondylolisthesis. Electronically Signed   By: Francene Boyers Gay.D.   On: 10/22/2014 13:07   Dg C-arm Gt 120 Min  10/22/2014  CLINICAL DATA:  Spinal stenosis and spondylolisthesis. EXAM: DG C-ARM GT 120 MIN; LUMBAR SPINE - 2-3 VIEW FLUOROSCOPY TIME:  Fluoroscopy Time (in minutes and seconds): 5 minutes 46 seconds COMPARISON:  MRI dated 09/15/2014 FINDINGS: AP and lateral C-arm images demonstrate the patient has undergone interbody and posterior fusion at L4-5. Pedicle screws, right lateral fixation screws and interbody fusion device appear in good position. Improved spondylolisthesis. IMPRESSION: Interbody and posterior and lateral fusion performed at L4-5 with reduction of the spondylolisthesis. Electronically Signed   By: Francene Boyers Gay.D.   On: 10/22/2014 13:07    Disposition: 01-Home or Self Care   POD #2 s/p A/P L4/5 fusion for severe stenosis and a grade 2 spondylolisthesis  - Norco for pain, Valium for muscle spasms - brace when up and ambulating - hand pain resolved -Written scripts for pain signed and in chart -D/C instructions sheet printed and in chart -D/C today  -F/U in office 2 weeks   Signed: [name] 11/04/2014, 4:35 PM

## 2014-11-06 ENCOUNTER — Other Ambulatory Visit: Payer: Self-pay | Admitting: Internal Medicine

## 2014-11-15 ENCOUNTER — Other Ambulatory Visit: Payer: Self-pay | Admitting: Internal Medicine

## 2014-11-18 ENCOUNTER — Encounter: Payer: Self-pay | Admitting: Physician Assistant

## 2014-11-18 ENCOUNTER — Encounter: Payer: Self-pay | Admitting: Internal Medicine

## 2014-11-19 ENCOUNTER — Other Ambulatory Visit: Payer: Self-pay | Admitting: Physician Assistant

## 2014-11-26 ENCOUNTER — Other Ambulatory Visit: Payer: Self-pay | Admitting: Physician Assistant

## 2015-01-19 ENCOUNTER — Other Ambulatory Visit: Payer: Self-pay | Admitting: Physician Assistant

## 2015-01-19 MED ORDER — PREGABALIN 75 MG PO CAPS: ORAL_CAPSULE | ORAL | Status: DC

## 2015-01-19 NOTE — Progress Notes (Signed)
Rx called into [facility].

## 2015-01-20 ENCOUNTER — Ambulatory Visit (INDEPENDENT_AMBULATORY_CARE_PROVIDER_SITE_OTHER): Payer: BLUE CROSS/BLUE SHIELD | Admitting: Physician Assistant

## 2015-01-20 ENCOUNTER — Encounter: Payer: Self-pay | Admitting: Physician Assistant

## 2015-01-20 ENCOUNTER — Other Ambulatory Visit: Payer: Self-pay

## 2015-01-20 VITALS — BP 126/74 | HR 86 | Temp 97.7°F | Resp 16 | Ht 65.5 in | Wt 273.0 lb

## 2015-01-20 DIAGNOSIS — I10 Essential (primary) hypertension: Secondary | ICD-10-CM

## 2015-01-20 DIAGNOSIS — R51 Headache: Secondary | ICD-10-CM

## 2015-01-20 DIAGNOSIS — D649 Anemia, unspecified: Secondary | ICD-10-CM

## 2015-01-20 DIAGNOSIS — M26629 Arthralgia of temporomandibular joint, unspecified side: Secondary | ICD-10-CM

## 2015-01-20 DIAGNOSIS — Z0000 Encounter for general adult medical examination without abnormal findings: Secondary | ICD-10-CM

## 2015-01-20 DIAGNOSIS — G4733 Obstructive sleep apnea (adult) (pediatric): Secondary | ICD-10-CM

## 2015-01-20 DIAGNOSIS — Z79899 Other long term (current) drug therapy: Secondary | ICD-10-CM

## 2015-01-20 DIAGNOSIS — E559 Vitamin D deficiency, unspecified: Secondary | ICD-10-CM | POA: Diagnosis not present

## 2015-01-20 DIAGNOSIS — K2100 Gastro-esophageal reflux disease with esophagitis, without bleeding: Secondary | ICD-10-CM

## 2015-01-20 DIAGNOSIS — R7303 Prediabetes: Secondary | ICD-10-CM

## 2015-01-20 DIAGNOSIS — E782 Mixed hyperlipidemia: Secondary | ICD-10-CM

## 2015-01-20 DIAGNOSIS — R519 Headache, unspecified: Secondary | ICD-10-CM

## 2015-01-20 DIAGNOSIS — K8020 Calculus of gallbladder without cholecystitis without obstruction: Secondary | ICD-10-CM

## 2015-01-20 DIAGNOSIS — G4769 Other sleep related movement disorders: Secondary | ICD-10-CM

## 2015-01-20 DIAGNOSIS — E8881 Metabolic syndrome: Secondary | ICD-10-CM

## 2015-01-20 NOTE — Progress Notes (Signed)
Complete Physical  Assessment and Plan: 1. Essential hypertension - continue medications, DASH diet, exercise and monitor at home. Call if greater than 130/80.  - CBC with Differential/Platelet - BASIC METABOLIC PANEL WITH GFR - Hepatic function panel - TSH - Urinalysis, Routine w reflex microscopic (not at [facility]) - Microalbumin / creatinine urine ratio - EKG 12-Lead  2. Prediabetes Discussed general issues about diabetes pathophysiology and management., Educational material distributed., Suggested low cholesterol diet., - Hemoglobin A1c - Insulin, fasting  3. Morbid obesity, unspecified obesity type (HCC) Obesity with co morbidities- long discussion about weight loss, diet, and exercise  4. Hyperlipidemia -continue medications, check lipids, decrease fatty foods, increase activity.  - Lipid panel  5. GET MGM  6. Vitamin D deficiency - VITAMIN D 25 Hydroxy (Vit-D Deficiency, Fractures)  7. Encounter for long-term (current) use of medications - Magnesium  8. ANEMIA, MILD - Iron and TIBC - Ferritin - Vitamin B12  9. Nonintractable headache, unspecified chronicity pattern, unspecified headache type Continue lyrica  10. Nocturnal myoclonus Better with lyrica  11. Arthralgia of temporomandibular joint, unspecified laterality monitor  12. OSA (obstructive sleep apnea) Not on CPAP, continue weight loss  13. GERD Continue PPI/H2 blocker, diet discussed  14. Calculus of gallbladder Monitor for symptoms   Discussed med's effects and SE's. Screening labs and tests as requested with regular follow-up as recommended.  HPI  44 y.o. female  presents for a complete physical.  Her blood pressure has been controlled at home, today their BP is BP: 126/74 mmHg She does not workout due to chronic pain. She denies chest pain, shortness of breath, dizziness.  She had recent L4-L5 fusion with Dr. Yevette Edwards in Sept 2016 and states she is doing much better, she does not have any  more radicular pain, still some pain in her lower back. She starts PT next week and will go back to work Jan 23rd if everything goes well.  She will take robaxin at night, and still taking lyrica, not on celebrex and will try to manage without it.  She is not on cholesterol medication and denies myalgias. Her cholesterol is at goal. The cholesterol last visit was:   Lab Results  Component Value Date   CHOL 188 02/25/2014   HDL 49 02/25/2014   LDLCALC 114* 02/25/2014   LDLDIRECT 134.4 07/26/2011   TRIG 126 02/25/2014   CHOLHDL 3.8 02/25/2014   She has been working on diet and exercise for prediabetes, and denies polydipsia, polyuria and visual disturbances. Last A1C in the office was:  Lab Results  Component Value Date   HGBA1C 5.6 11/13/2013  Patient is on Vitamin D supplement.   Lab Results  Component Value Date   VD25OH 25* 02/25/2014     She is morbidly obese, had history of + OSA but unable to tolerate CPAP.  Body mass index is 44.72 kg/(m^2). Her weight is down from last physical close to 20 lbs. She was on wellbutrin for depression/anxiety but stopped it 1 month ago.  Wt Readings from Last 3 Encounters:  01/20/15 273 lb (123.832 kg)  10/22/14 268 lb 3 oz (121.649 kg)  10/14/14 268 lb 3.2 oz (121.655 kg)    Current Medications:  Current Outpatient Prescriptions on File Prior to Visit  Medication Sig Dispense Refill  . Cholecalciferol (VITAMIN D PO) Take 2,000 Units by mouth daily.     . fluticasone (FLONASE) 50 MCG/ACT nasal spray Place 2 sprays into both nostrils at bedtime.    Marland Kitchen levocetirizine (XYZAL) 5  MG tablet TAKE 1 TABLET BY MOUTH DAILY 30 tablet 2  . methocarbamol (ROBAXIN) 500 MG tablet TAKE 1 TABLET (500 MG TOTAL) BY MOUTH 3 (THREE) TIMES DAILY AS NEEDED FOR MUSCLE SPASMS. 90 tablet 1  . Multiple Vitamin (MULTIVITAMIN) capsule Take 1 capsule by mouth daily.    . pregabalin (LYRICA) 75 MG capsule TAKE 1 CAPSULE BY MOUTH 3 TIMES DAILY AND 2 CAPSULES AT NIGHT 150  capsule 0   No current facility-administered medications on file prior to visit.   Health Maintenance:   Immunization History  Administered Date(s) Administered  . Influenza Split 11/13/2011, 11/13/2013  . Influenza Whole 11/25/2008  . Td 01/23/1990  . Tdap 08/21/2012   Tetanus: 2014 Pneumovax:  N/A Prevnar 13: N/A Flu vaccine: 2015 has not had yet Zostavax: N/A LMP: 1 week ago, not regular Pap: 07/2012 due 2017, never abnormal pap MGM: 08/2012  DUE DEXA: N/A Colonoscopy: N/a EGD: N/A Last Eye Exam: Dr. Shea Evans Has seen Dr. Juanetta Gosling  Patient Care Team: Lucky Cowboy, MD as PCP - General (Internal Medicine)  Allergies: No Known Allergies Medical History:  Past Medical History  Diagnosis Date  . Allergic rhinitis   . Hypertension   . Obesity   . Chronic low back pain   . 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 OSA on CPAP     does not use CPAP  . GERD (gastroesophageal reflux disease)   . Headache     migraine  . Arthritis   . Anemia    Surgical History:  Past Surgical History  Procedure Laterality Date  . Cholecystectomy  Feb. 2011    Gall Bladder  . Anterior lat lumbar fusion N/A 10/22/2014    Procedure: ANTERIOR LATERAL LUMBAR FUSION 1 LEVEL;  Surgeon: Estill Bamberg, MD;  Location: [facility] OR;  Service: Orthopedics;  Laterality: N/A;   Family History:  Family History  Problem Relation Age of Onset  . Cancer Mother   . Lung disease Mother   . Arthritis Father   . Hypertension Father   . Stroke Father   . Melanoma Sister   . Cancer Sister     Melanoma  . Fibromyalgia Sister   . Mental illness Other     emotional illness (other blood relative)  . Stroke Other     Stroke (parent and grandparent)  . Heart disease Other     (grandparent)  . Hypertension Other     (other blood relative)  . Arthritis Other     Parent and grandparent   Social History:  Social History  Substance Use Topics  . Smoking status: Never Smoker   . Smokeless tobacco: Never Used  .  Alcohol Use: Yes     Comment: occasionally   Review of Systems:  Review of Systems  Constitutional: Negative.   HENT: Negative for congestion, ear discharge, ear pain, hearing loss, nosebleeds, sore throat and tinnitus.   Eyes: Negative.   Respiratory: Negative.  Negative for stridor.   Cardiovascular: Negative.   Gastrointestinal: Negative.   Genitourinary: Negative.   Musculoskeletal: Positive for back pain. Negative for myalgias, joint pain, falls and neck pain.  Skin: Negative.   Neurological: Headaches:  TMJ.  Endo/Heme/Allergies: Negative.   Psychiatric/Behavioral: Negative.      Physical Exam: Estimated body mass index is 44.72 kg/(m^2) as calculated from the following:   Height as of this encounter: 5' 5.5" (1.664 m).   Weight as of this encounter: 273 lb (123.832 kg). BP 126/74 mmHg  Pulse 86  Temp(Src) 97.7  F (36.5 C) (Temporal)  Resp 16  Ht 5' 5.5" (1.664 m)  Wt 273 lb (123.832 kg)  BMI 44.72 kg/m2  SpO2 98%  LMP 01/13/2015 General Appearance: Well nourished, in no apparent distress.  Eyes: PERRLA, EOMs, conjunctiva no swelling or erythema, normal fundi and vessels.  Sinuses: No Frontal/maxillary tenderness  ENT/Mouth: Ext aud canals clear, normal light reflex with TMs without erythema, bulging. Good dentition. No erythema, swelling, or exudate on post pharynx. Tonsils not swollen or erythematous. Hearing normal.  Neck: Supple, thyroid normal. No bruits  Respiratory: Respiratory effort normal, BS equal bilaterally without rales, rhonchi, wheezing or stridor.  Cardio: RRR without murmurs, rubs or gallops. Brisk peripheral pulses with 1+edema.  Chest: symmetric, with normal excursions and percussion.  Breasts: Symmetric, without lumps, nipple discharge, retractions.  Abdomen: Soft, nontender, well healing lateral right AB scar, obese no guarding, rebound, hernias, masses, or organomegaly. .  Lymphatics: Non tender without lymphadenopathy.  Genitourinary:  defer Musculoskeletal: Full ROM all peripheral extremities,5/5 strength, pain with change in position. Skin: Warm, dry without rashes, lesions, ecchymosis. Neuro: Cranial nerves intact, reflexes equal bilaterally. Normal muscle tone, no cerebellar symptoms. Sensation intact.  Psych: Awake and oriented X 3, normal affect, Insight and Judgment appropriate.   EKG: WNL no changes.   Quentin MullingAmanda Herta Hink 3:13 PM [facility]

## 2015-01-20 NOTE — Patient Instructions (Addendum)
Encourage you to get the 3D Mammogram  The 3D Mammogram is much more specific and sensitive to pick up breast cancer. For women with fibrocystic breast or lumpy breast it can be hard to determine if it is cancer or not but the 3D mammogram is able to tell this difference which cuts back on unneeded additional tests or scary call backs.   - over 40% increase in detection of breast cancer - over 40% reduction in false positives.  - fewer call backs - reduced anxiety - improved outcomes - [facility] Schedule an appointment by calling [phone].   .We want weight loss that will last so you should lose 1-2 pounds a week.  THAT IS IT! Please pick THREE things a month to change. Once it is a habit check off the item. Then pick another three items off the list to become habits.  If you are already doing a habit on the list GREAT!  Cross that item off! o Don't drink your calories. Ie, alcohol, soda, fruit juice, and sweet tea.  o Drink more water. Drink a glass when you feel hungry or before each meal.  o Eat breakfast - Complex carb and protein (likeDannon light and fit yogurt, oatmeal, fruit, eggs, [place] bacon). o Measure your cereal.  Eat no more than one cup a day. (ie [place]) o Eat an apple a day. o Add a vegetable a day. o Try a new vegetable a month. o Use Pam! Stop using oil or butter to cook. o Don't finish your plate or use smaller plates. o Share your dessert. o Eat sugar free Jello for dessert or frozen grapes. o Don't eat 2-3 hours before bed. o Switch to whole wheat bread, pasta, and brown rice. o Make healthier choices when you eat out. No fries! o Pick baked chicken, NOT fried. o Don't forget to SLOW DOWN when you eat. It is not going anywhere.  o Take the stairs. o Park far away in the parking lot o [employer] (or weights) for 10 minutes while watching TV. o Walk at work for 10 minutes during break. o Walk outside 1 time a week with your  friend, kids, dog, or significant other. o Start a walking group at church. o Walk the mall as much as you can tolerate.  o Keep a food diary. o Weigh yourself daily. o Walk for 15 minutes 3 days per week. o Cook at home more often and eat out less.  If life happens and you go back to old habits, it is okay.  Just start over. You can do it!   If you experience chest pain, get short of breath, or tired during the exercise, please stop immediately and inform your doctor.   Before you even begin to attack a weight-loss plan, it pays to remember this: You are not fat. You have fat. Losing weight isn't about blame or shame; it's simply another achievement to accomplish. Dieting is like any other skill-you have to buckle down and work at it. As long as you act in a smart, reasonable way, you'll ultimately get where you want to be. Here are some weight loss pearls for you.  1. It's Not a Diet. It's a Lifestyle Thinking of a diet as something you're on and suffering through only for the short term doesn't work. To shed weight and keep it off, you need to make permanent changes to the way you eat. It's OK to indulge occasionally,  of course, but if you cut calories temporarily and then revert to your old way of eating, you'll gain back the weight quicker than you can say yo-yo. Use it to lose it. Research shows that one of the best predictors of long-term weight loss is how many pounds you drop in the first month. For that reason, nutritionists often suggest being stricter for the first two weeks of your new eating strategy to build momentum. Cut out added sugar and alcohol and avoid unrefined carbs. After that, figure out how you can reincorporate them in a way that's healthy and maintainable.  2. There's a Right Way to Exercise Working out burns calories and fat and boosts your metabolism by building muscle. But those trying to lose weight are notorious for overestimating the number of calories they burn and  underestimating the amount they take in. Unfortunately, your system is biologically programmed to hold on to extra pounds and that means when you start exercising, your body senses the deficit and ramps up its hunger signals. If you're not diligent, you'll eat everything you burn and then some. Use it to lose it. Cardio gets all the exercise glory, but strength and interval training are the real heroes. They help you build lean muscle, which in turn increases your metabolism and calorie-burning ability 3. Don't Overreact to Mild Hunger Some people have a hard time losing weight because of hunger anxiety. To them, being hungry is bad-something to be avoided at all costs-so they carry snacks with them and eat when they don't need to. Others eat because they're stressed out or bored. While you never want to get to the point of being ravenous (that's when bingeing is likely to happen), a hunger pang, a craving, or the fact that it's 3:00 p.m. should not send you racing for the vending machine or obsessing about the energy bar in your purse. Ideally, you should put off eating until your stomach is growling and it's difficult to concentrate.  Use it to lose it. When you feel the urge to eat, use the HALT method. Ask yourself, Am I really hungry? Or am I angry or anxious, lonely or bored, or tired? If you're still not certain, try the apple test. If you're truly hungry, an apple should seem delicious; if it doesn't, something else is going on. Or you can try drinking water and making yourself busy, if you are still hungry try a healthy snack.  4. Not All Calories Are Created Equal The mechanics of weight loss are pretty simple: Take in fewer calories than you use for energy. But the kind of food you eat makes all the difference. Processed food that's high in saturated fat and refined starch or sugar can cause inflammation that disrupts the hormone signals that tell your brain you're full. The result: You eat a lot  more.  Use it to lose it. Clean up your diet. Swap in whole, unprocessed foods, including vegetables, lean protein, and healthy fats that will fill you up and give you the biggest nutritional bang for your calorie buck. In a few weeks, as your brain starts receiving regular hunger and fullness signals once again, you'll notice that you feel less hungry overall and naturally start cutting back on the amount you eat.  5. Protein, Produce, and Plant-Based Fats Are Your Weight-Loss Trinity Here's why eating the three Ps regularly will help you drop pounds. Protein fills you up. You need it to build lean muscle, which keeps your metabolism humming so  that you can torch more fat. People in a weight-loss program who ate double the recommended daily allowance for protein (about 110 grams for a 150-pound woman) lost 70 percent of their weight from fat, while people who ate the RDA lost only about 40 percent, one study found. Produce is packed with filling fiber. "It's very difficult to consume too many calories if you're eating a lot of vegetables. Example: Three cups of broccoli is a lot of food, yet only 93 calories. (Fruit is another story. It can be easy to overeat and can contain a lot of calories from sugar, so be sure to monitor your intake.) Plant-based fats like olive oil and those in avocados and nuts are healthy and extra satiating.  Use it to lose it. Aim to incorporate each of the three Ps into every meal and snack. People who eat protein throughout the day are able to keep weight off, according to a study in the American Journal of Clinical Nutrition. In addition to meat, poultry and seafood, good sources are beans, lentils, eggs, tofu, and yogurt. As for fat, keep portion sizes in check by measuring out salad dressing, oil, and nut butters (shoot for one to two tablespoons). Finally, eat veggies or a little fruit at every meal. People who did that consumed 308 fewer calories but didn't feel any  hungrier than when they didn't eat more produce.  7. How You Eat Is As Important As What You Eat In order for your brain to register that you're full, you need to focus on what you're eating. Sit down whenever you eat, preferably at a table. Turn off the TV or computer, put down your phone, and look at your food. Smell it. Chew slowly, and don't put another bite on your fork until you swallow. When women ate lunch this attentively, they consumed 30 percent less when snacking later than those who listened to an audiobook at lunchtime, according to a study in the [place] Journal of Nutrition. 8. Weighing Yourself Really Works The scale provides the best evidence about whether your efforts are paying off. Seeing the numbers tick up or down or stagnate is motivation to keep going-or to rethink your approach. A 2015 study at [facility] found that daily weigh-ins helped people lose more weight, keep it off, and maintain that loss, even after two years. Use it to lose it. Step on the scale at the same time every day for the best results. If your weight shoots up several pounds from one weigh-in to the next, don't freak out. Eating a lot of salt the night before or having your period is the likely culprit. The number should return to normal in a day or two. It's a steady climb that you need to do something about. 9. Too Much Stress and Too Little Sleep Are Your Enemies When you're tired and frazzled, your body cranks up the production of cortisol, the stress hormone that can cause carb cravings. Not getting enough sleep also boosts your levels of ghrelin, a hormone associated with hunger, while suppressing leptin, a hormone that signals fullness and satiety. People on a diet who slept only five and a half hours a night for two weeks lost 55 percent less fat and were hungrier than those who slept eight and a half hours, according to a study in the [place] [employer] Journal. Use it to lose it.  Prioritize sleep, aiming for seven hours or more a night, which research shows helps lower stress. And  make sure you're getting quality zzz's. If a snoring spouse or a fidgety cat wakes you up frequently throughout the night, you may end up getting the equivalent of just four hours of sleep, according to a study from [facility]. Keep pets out of the bedroom, and use a white-noise app to drown out snoring. 10. You Will Hit a plateau-And You Can Bust Through It As you slim down, your body releases much less leptin, the fullness hormone.  If you're not strength training, start right now. Building muscle can raise your metabolism to help you overcome a plateau. To keep your body challenged and burning calories, incorporate new moves and more intense intervals into your workouts or add another sweat session to your weekly routine. Alternatively, cut an extra 100 calories or so a day from your diet. Now that you've lost weight, your body simply doesn't need as much fuel.

## 2015-01-21 LAB — URINALYSIS, ROUTINE W REFLEX MICROSCOPIC
BILIRUBIN URINE: NEGATIVE
GLUCOSE, UA: NEGATIVE
HGB URINE DIPSTICK: NEGATIVE
KETONES UR: NEGATIVE
NITRITE: NEGATIVE
PH: 6 (ref 5.0–8.0)
Protein, ur: NEGATIVE
Specific Gravity, Urine: 1.004 (ref 1.001–1.035)

## 2015-01-21 LAB — CBC WITH DIFFERENTIAL/PLATELET
BASOS ABS: 0 10*3/uL (ref 0.0–0.1)
Basophils Relative: 0 % (ref 0–1)
EOS ABS: 0.2 10*3/uL (ref 0.0–0.7)
EOS PCT: 2 % (ref 0–5)
HEMATOCRIT: 37.2 % (ref 36.0–46.0)
Hemoglobin: 12.4 g/dL (ref 12.0–15.0)
Lymphocytes Relative: 29 % (ref 12–46)
Lymphs Abs: 2.9 10*3/uL (ref 0.7–4.0)
MCH: 27.7 pg (ref 26.0–34.0)
MCHC: 33.3 g/dL (ref 30.0–36.0)
MCV: 83.2 fL (ref 78.0–100.0)
MONO ABS: 0.8 10*3/uL (ref 0.1–1.0)
MPV: 9.2 fL (ref 8.6–12.4)
Monocytes Relative: 8 % (ref 3–12)
Neutro Abs: 6 10*3/uL (ref 1.7–7.7)
Neutrophils Relative %: 61 % (ref 43–77)
Platelets: 396 10*3/uL (ref 150–400)
RBC: 4.47 MIL/uL (ref 3.87–5.11)
RDW: 13.6 % (ref 11.5–15.5)
WBC: 9.9 10*3/uL (ref 4.0–10.5)

## 2015-01-21 LAB — BASIC METABOLIC PANEL WITH GFR
BUN: 12 mg/dL (ref 7–25)
CO2: 24 mmol/L (ref 20–31)
Calcium: 9.4 mg/dL (ref 8.6–10.2)
Chloride: 101 mmol/L (ref 98–110)
Creat: 0.3 mg/dL — ABNORMAL LOW (ref 0.50–1.10)
GFR, Est Non African American: >89 mL/min (ref >=60)
GLUCOSE: 61 mg/dL — AB (ref 65–99)
POTASSIUM: 4.4 mmol/L (ref 3.5–5.3)
Sodium: 139 mmol/L (ref 135–146)

## 2015-01-21 LAB — HEMOGLOBIN A1C
HEMOGLOBIN A1C: 5.5 % (ref <5.7)
MEAN PLASMA GLUCOSE: 111 mg/dL (ref <117)

## 2015-01-21 LAB — MICROALBUMIN / CREATININE URINE RATIO
Creatinine, Urine: 14 mg/dL — ABNORMAL LOW (ref 20–320)
Microalb, Ur: <0.2 mg/dL

## 2015-01-21 LAB — VITAMIN D 25 HYDROXY (VIT D DEFICIENCY, FRACTURES): Vit D, 25-Hydroxy: 25 ng/mL — ABNORMAL LOW (ref 30–100)

## 2015-01-21 LAB — URINALYSIS, MICROSCOPIC ONLY
Bacteria, UA: NONE SEEN [HPF]
Casts: NONE SEEN [LPF]
Crystals: NONE SEEN [HPF]
RBC / HPF: NONE SEEN RBC/HPF (ref <=2)
WBC UA: NONE SEEN WBC/HPF (ref <=5)
YEAST: NONE SEEN [HPF]

## 2015-01-21 LAB — HEPATIC FUNCTION PANEL
ALBUMIN: 4.2 g/dL (ref 3.6–5.1)
ALK PHOS: 69 U/L (ref 33–115)
ALT: 16 U/L (ref 6–29)
AST: 18 U/L (ref 10–30)
Bilirubin, Direct: 0.1 mg/dL (ref <=0.2)
Indirect Bilirubin: 0.2 mg/dL (ref 0.2–1.2)
TOTAL PROTEIN: 7.1 g/dL (ref 6.1–8.1)
Total Bilirubin: 0.3 mg/dL (ref 0.2–1.2)

## 2015-01-21 LAB — LIPID PANEL
Cholesterol: 187 mg/dL (ref 125–200)
HDL: 49 mg/dL (ref >=46)
LDL CALC: 105 mg/dL (ref <130)
TRIGLYCERIDES: 163 mg/dL — AB (ref <150)
Total CHOL/HDL Ratio: 3.8 Ratio (ref <=5.0)
VLDL: 33 mg/dL — ABNORMAL HIGH (ref <30)

## 2015-01-21 LAB — IRON AND TIBC
%SAT: 16 % (ref 11–50)
Iron: 48 ug/dL (ref 40–190)
TIBC: 306 ug/dL (ref 250–450)
UIBC: 258 ug/dL (ref 125–400)

## 2015-01-21 LAB — INSULIN, FASTING: INSULIN FASTING, SERUM: 8.5 u[IU]/mL (ref 2.0–19.6)

## 2015-01-21 LAB — VITAMIN B12: VITAMIN B 12: 457 pg/mL (ref 211–911)

## 2015-01-21 LAB — FERRITIN: FERRITIN: 71 ng/mL (ref 10–291)

## 2015-01-21 LAB — TSH: TSH: 3.477 u[IU]/mL (ref 0.350–4.500)

## 2015-01-21 LAB — MAGNESIUM: MAGNESIUM: 1.9 mg/dL (ref 1.5–2.5)

## 2015-02-01 IMAGING — CR DG LUMBAR SPINE COMPLETE 4+V
6 series · 6 of 6 positions shown · non-contrast
Comparison: CT of the abdomen and pelvis 11/30/2011.

CLINICAL DATA: Low back pain.

EXAM:
LUMBAR SPINE - COMPLETE 4+ VIEW

[view not recorded (1 of 6)]
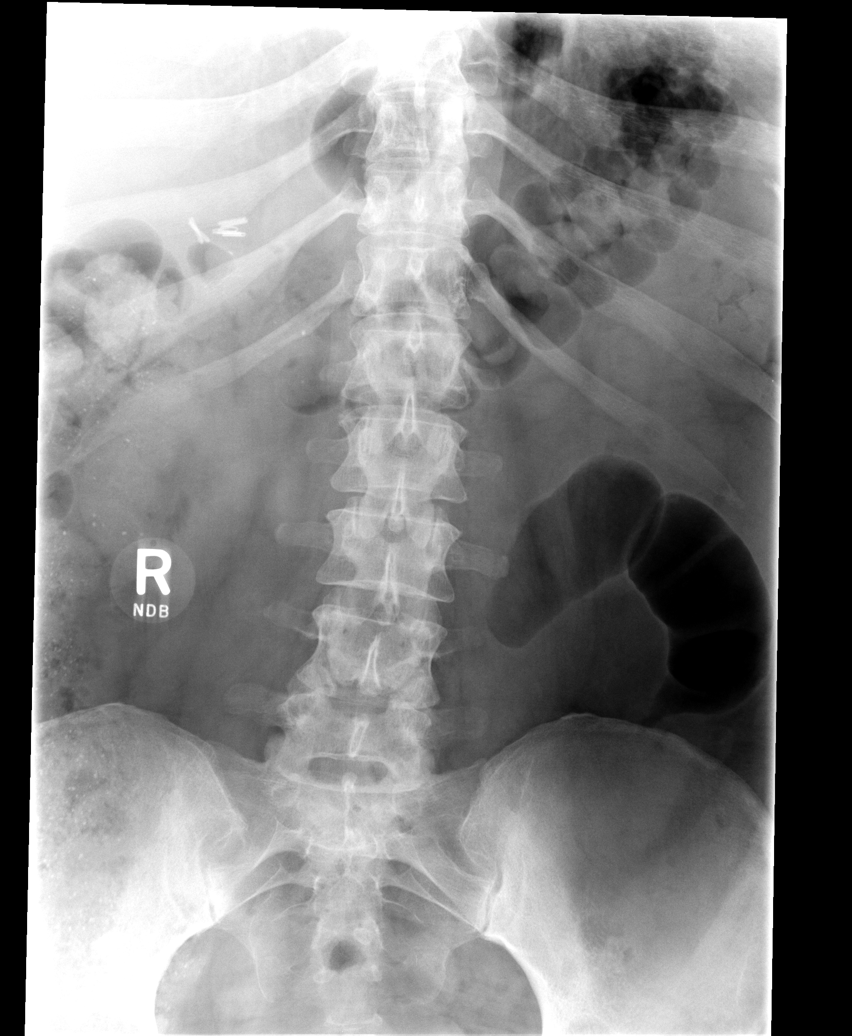

[view not recorded (2 of 6)]
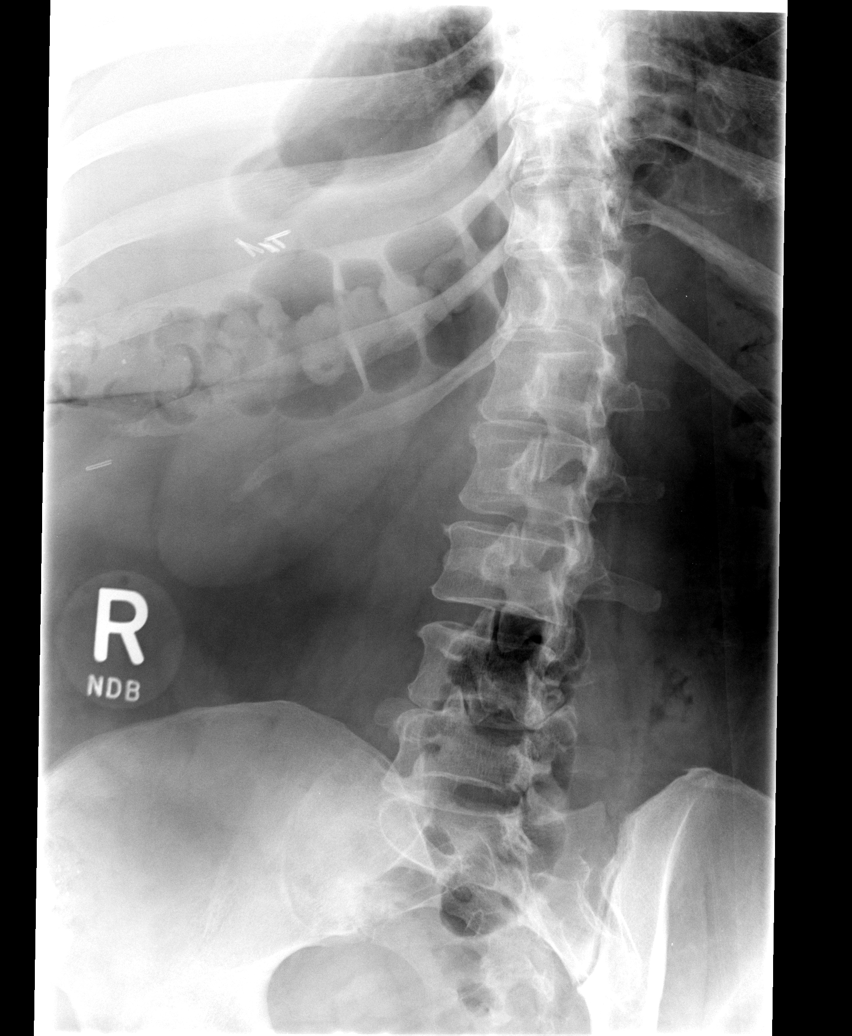

[view not recorded (3 of 6)]
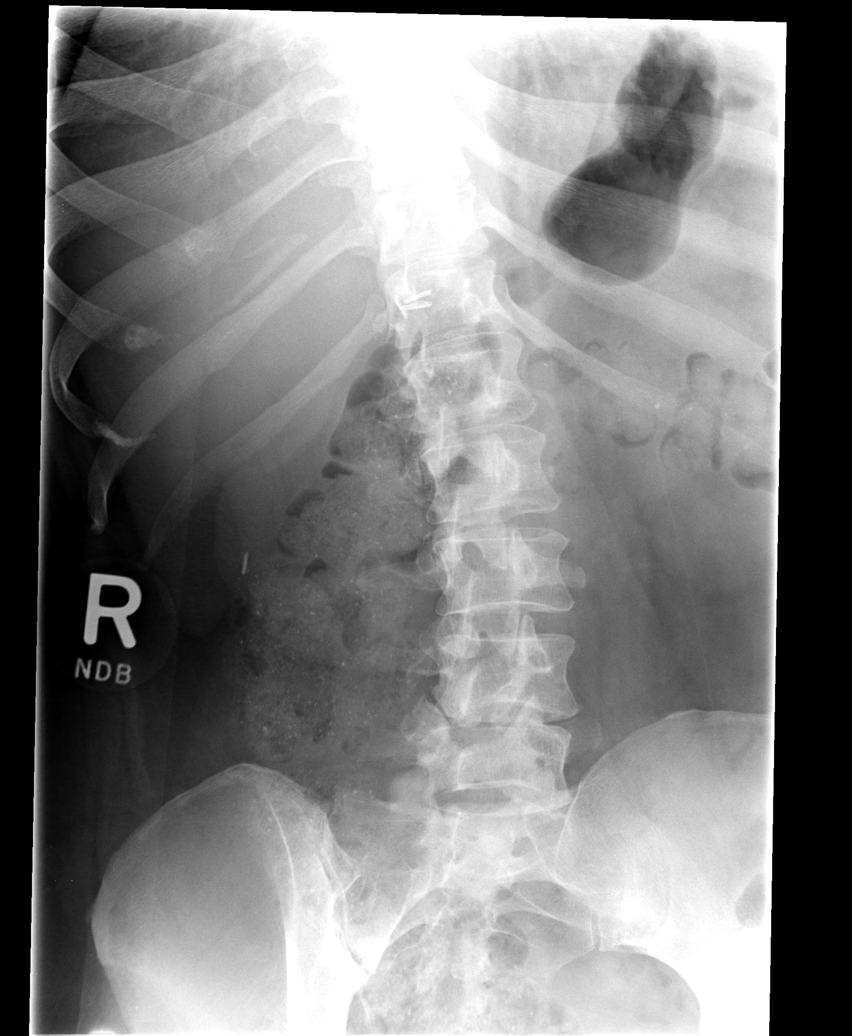

[view not recorded (4 of 6)]
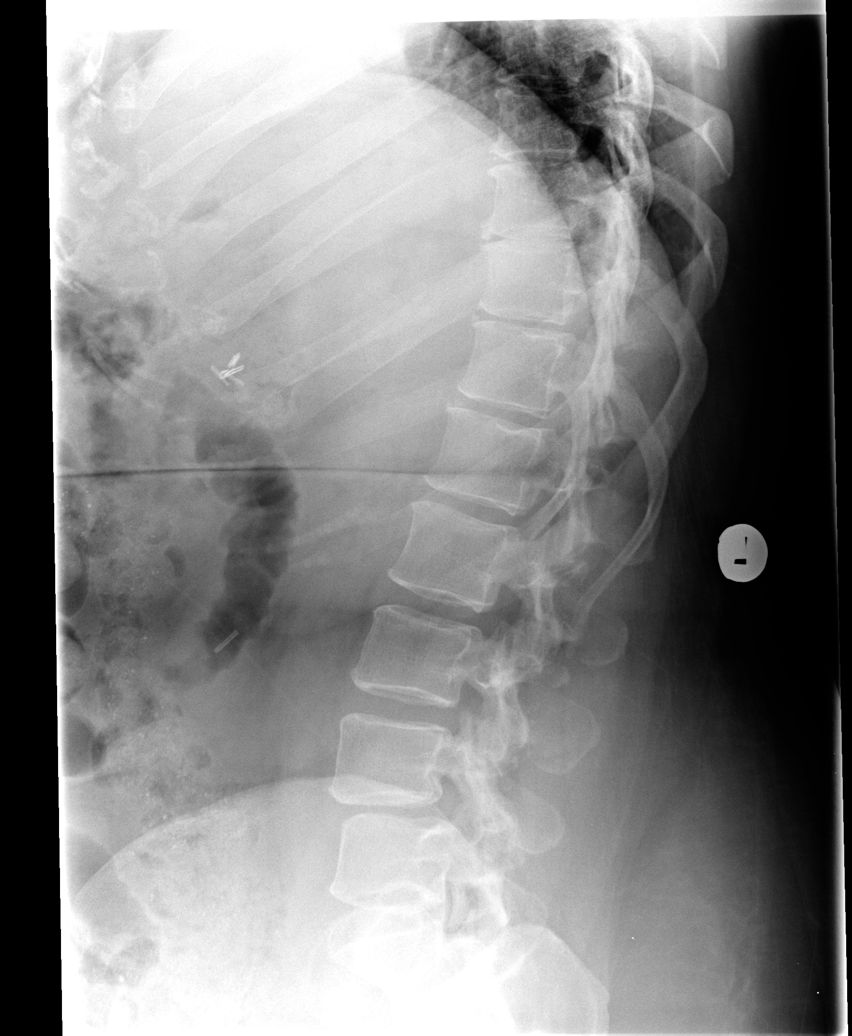

[view not recorded (5 of 6)]
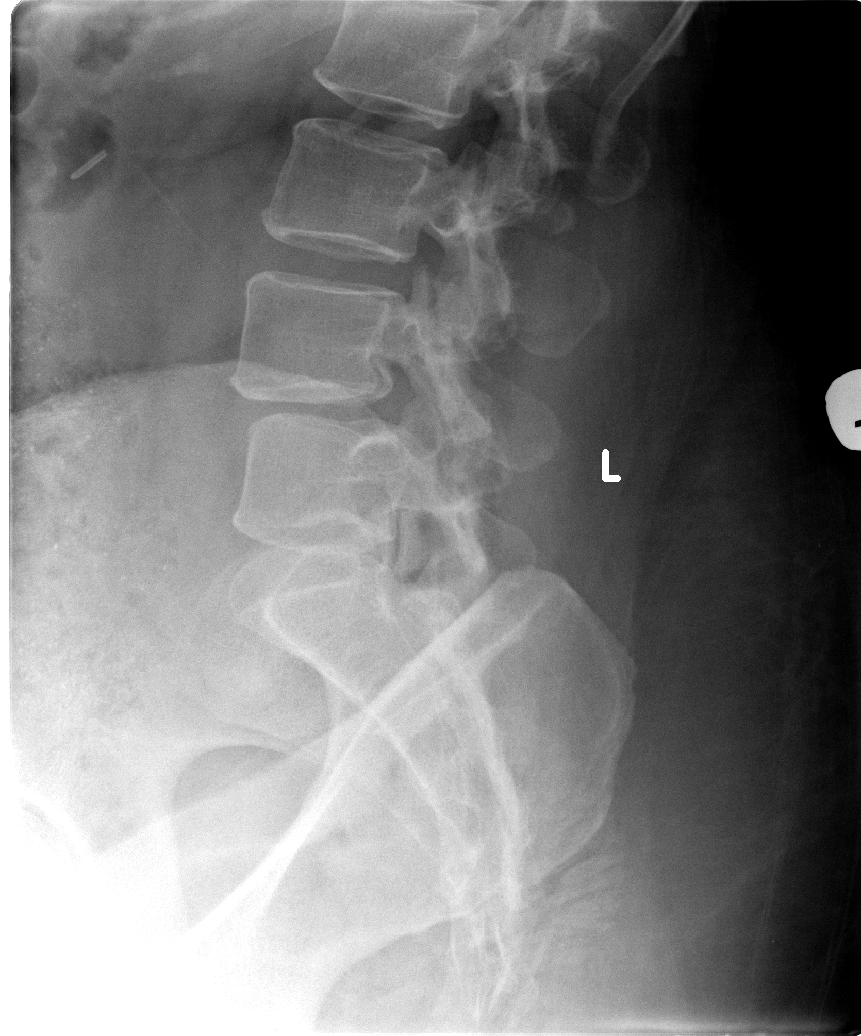

[view not recorded (6 of 6)]
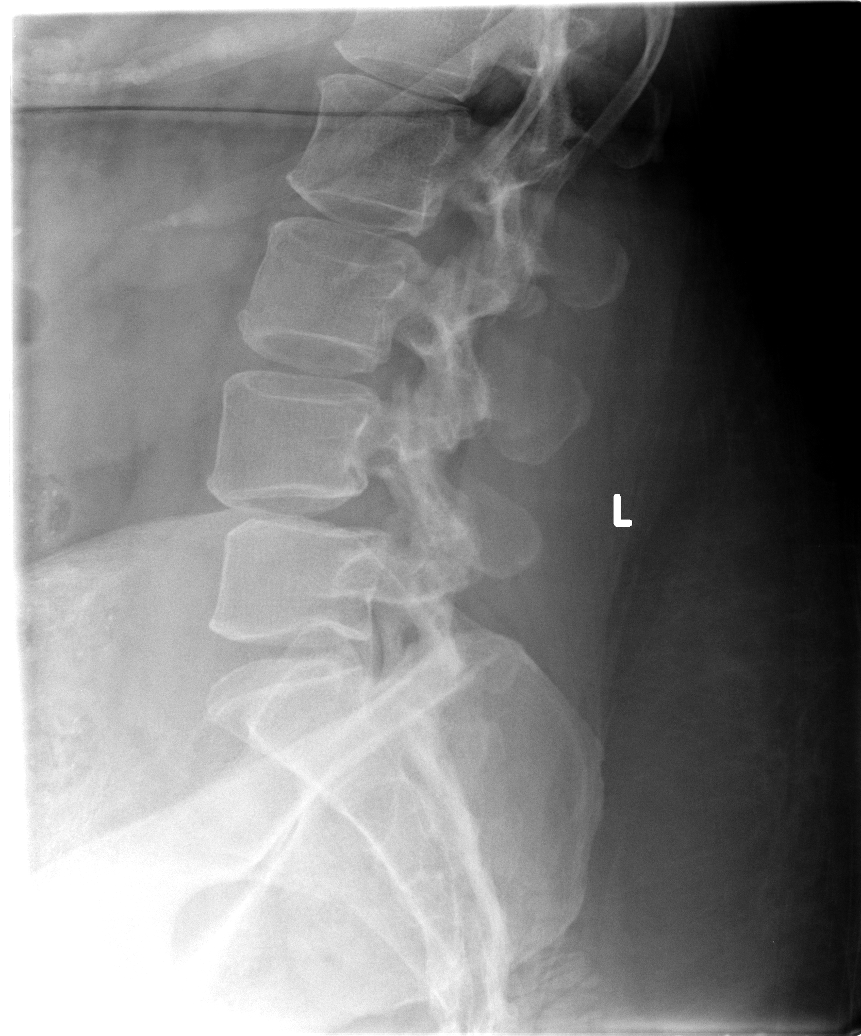

[6 of 6 positions shown; findings below may reference images not displayed]

FINDINGS: Six views of the lumbar spine demonstrate no definite acute
displaced fracture or compression type fracture. 6 mm of
anterolisthesis of L4 upon L5. Alignment is otherwise anatomic. No
definite defects of the pars interarticularis are noted. Mild
multilevel degenerative disc disease, most pronounced at L4-L5. Mild
multilevel facet arthropathy, most pronounced at L5-S1. Surgical
clips project over the right upper quadrant of the abdomen,
compatible with prior cholecystectomy.
IMPRESSION: 1. No acute radiographic abnormality of the lumbar spine.
2. Multilevel degenerative disc disease and lumbar spondylosis,
including 6 mm of anterolisthesis of L4 upon L5, as discussed above.

## 2015-02-11 ENCOUNTER — Other Ambulatory Visit: Payer: Self-pay | Admitting: Internal Medicine

## 2015-02-11 NOTE — Telephone Encounter (Signed)
Rx called into [facility] pharmacy. 

## 2015-03-11 ENCOUNTER — Other Ambulatory Visit: Payer: Self-pay | Admitting: Physician Assistant

## 2015-03-11 ENCOUNTER — Other Ambulatory Visit: Payer: Self-pay | Admitting: Internal Medicine

## 2015-03-11 MED ORDER — PREGABALIN 75 MG PO CAPS: ORAL_CAPSULE | ORAL | Status: DC

## 2015-03-11 NOTE — Progress Notes (Signed)
Rx called into [facility]. 

## 2015-03-17 ENCOUNTER — Other Ambulatory Visit: Payer: Self-pay | Admitting: Internal Medicine

## 2015-03-28 ENCOUNTER — Encounter: Payer: Self-pay | Admitting: Physician Assistant

## 2015-03-29 ENCOUNTER — Encounter: Payer: Self-pay | Admitting: Physician Assistant

## 2015-03-29 ENCOUNTER — Ambulatory Visit (INDEPENDENT_AMBULATORY_CARE_PROVIDER_SITE_OTHER): Payer: BLUE CROSS/BLUE SHIELD | Admitting: Physician Assistant

## 2015-03-29 VITALS — BP 110/82 | HR 97 | Temp 97.3°F | Resp 16 | Ht 65.0 in | Wt 274.2 lb

## 2015-03-29 DIAGNOSIS — R1011 Right upper quadrant pain: Secondary | ICD-10-CM | POA: Diagnosis not present

## 2015-03-29 MED ORDER — BUPROPION HCL ER (XL) 150 MG PO TB24: ORAL_TABLET | ORAL | Status: DC

## 2015-03-29 MED ORDER — PREGABALIN 150 MG PO CAPS: ORAL_CAPSULE | ORAL | Status: DC

## 2015-03-29 MED ORDER — SUCRALFATE 1 G PO TABS: 1.0000 g | ORAL_TABLET | Freq: Four times a day (QID) | ORAL | Status: DC

## 2015-03-29 NOTE — Patient Instructions (Signed)

## 2015-03-29 NOTE — Progress Notes (Signed)
Subjective:    Patient ID: Emily Gay, female    DOB: 08/16/1970, 45 y.o.   MRN: [id number]  HPI 45 y.o. obese WF presents with RUQ pain. She had L4-L5 fusion sept, doing well. She had choley 2011 but states last 3 years has had RUQ pain with fried foods, has gotten worse this past Friday. She ate fried shrimp for dinner, less than 30 mins she started to have RUQ stabbing pain, starts right breast around to right flank/back, no nausea/vomiting, no fever/chills, took hydrocodone from her back surgery that helped. Saturday had some cake and alcohol for husband's birthday and it got worse again, yesterday she ate bland foods and feel a little better today. No SOB, urinary issues. She is on protonix for GERD. Has been taking celebrex since surgery but did not take over the weekend. Has had ETOH but very rare.   Blood pressure 110/82, pulse 97, temperature 97.3 F (36.3 C), temperature source Temporal, resp. rate 16, height 5\' 5"  (1.651 m), weight 274 lb 3.2 oz (124.376 kg), last menstrual period 03/10/2015, SpO2 98 %.  Current Outpatient Prescriptions on File Prior to Visit  Medication Sig Dispense Refill  . buPROPion (WELLBUTRIN XL) 300 MG 24 hr tablet TAKE 1 TABLET (300 MG TOTAL) BY MOUTH EVERY MORNING. 30 tablet 0  . celecoxib (CELEBREX) 200 MG capsule TAKE 1 CAPSULE (200 MG TOTAL) BY MOUTH 2 (TWO) TIMES DAILY. FOR PAIN &INFLAMMATION 60 capsule 1  . Cholecalciferol (VITAMIN D PO) Take 2,000 Units by mouth daily.     . fluticasone (FLONASE) 50 MCG/ACT nasal spray Place 2 sprays into both nostrils at bedtime.    Marland Kitchen. levocetirizine (XYZAL) 5 MG tablet TAKE 1 TABLET BY MOUTH DAILY 30 tablet 2  . methocarbamol (ROBAXIN) 500 MG tablet TAKE 1 TABLET (500 MG TOTAL) BY MOUTH 3 (THREE) TIMES DAILY AS NEEDED FOR MUSCLE SPASMS. 90 tablet 1  . Multiple Vitamin (MULTIVITAMIN) capsule Take 1 capsule by mouth daily.    . pregabalin (LYRICA) 75 MG capsule TAKE 1 CAPSULE BY MOUTH 3 TIMES DAILY AND 2 CAPSULES AT  NIGHT 150 capsule 2   No current facility-administered medications on file prior to visit.   Past Medical History  Diagnosis Date  . Allergic rhinitis   . Hypertension   . Obesity   . Chronic low back pain   . 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 OSA on CPAP     does not use CPAP  . GERD (gastroesophageal reflux disease)   . Headache     migraine  . Arthritis   . Anemia    Review of Systems  Constitutional: Negative.   HENT: Negative.  Negative for sore throat.   Eyes: Negative.   Respiratory: Negative for cough, choking, shortness of breath and wheezing.   Cardiovascular: Negative.  Negative for chest pain.  Gastrointestinal: Positive for nausea and abdominal pain. Negative for vomiting, diarrhea, constipation, blood in stool, abdominal distention, anal bleeding and rectal pain.  Genitourinary: Negative.   Musculoskeletal: Negative.   Skin: Negative.   Neurological: Negative.   Hematological: Negative.   Psychiatric/Behavioral: Negative.        Objective:   Physical Exam  Constitutional: She is oriented to person, place, and time. She appears well-developed and well-nourished.  HENT:  Head: Normocephalic and atraumatic.  Right Ear: External ear normal.  Left Ear: External ear normal.  Mouth/Throat: Oropharynx is clear and moist.  Eyes: Conjunctivae and EOM are normal. Pupils are equal, round, and reactive to light.  Neck: Normal range of motion. Neck supple. No thyromegaly present.  Cardiovascular: Normal rate, regular rhythm and normal heart sounds.  Exam reveals no gallop and no friction rub.   No murmur heard. Pulmonary/Chest: Effort normal and breath sounds normal. No respiratory distress. She has no wheezes.  Abdominal: Soft. Bowel sounds are normal. She exhibits no shifting dullness, no distension, no abdominal bruit, no pulsatile midline mass and no mass. There is no hepatosplenomegaly. There is tenderness in the right upper quadrant and epigastric area. There is  guarding and positive Murphy's sign. There is no rigidity, no rebound, no CVA tenderness and no tenderness at McBurney's point. No hernia.  Musculoskeletal: Normal range of motion.  Lymphadenopathy:    She has no cervical adenopathy.  Neurological: She is alert and oriented to person, place, and time.  Skin: Skin is warm and dry.  Psychiatric: She has a normal mood and affect.       Assessment & Plan:  RUQ pain + murphy, no fever, chills, jaundice Get [place] to rule out stone in bile duct versus GERD seconday to NSAIDS Avoid fatty foods, continue protonix, carafate given Go to ER if worsening pain, fever, chills, unable to take anything by mouth.   Future Appointments Date Time Provider Department Center  01/27/2016 3:00 PM Quentin Mulling, PA-C [id number] None

## 2015-03-30 LAB — HEPATIC FUNCTION PANEL
ALK PHOS: 77 U/L (ref 33–115)
ALT: 12 U/L (ref 6–29)
AST: 14 U/L (ref 10–30)
Albumin: 4 g/dL (ref 3.6–5.1)
BILIRUBIN DIRECT: 0.1 mg/dL (ref <=0.2)
BILIRUBIN INDIRECT: 0.2 mg/dL (ref 0.2–1.2)
Total Bilirubin: 0.3 mg/dL (ref 0.2–1.2)
Total Protein: 7.1 g/dL (ref 6.1–8.1)

## 2015-03-30 LAB — BASIC METABOLIC PANEL WITH GFR
BUN: 10 mg/dL (ref 7–25)
CALCIUM: 8.7 mg/dL (ref 8.6–10.2)
CO2: 28 mmol/L (ref 20–31)
Chloride: 102 mmol/L (ref 98–110)
Creat: 0.7 mg/dL (ref 0.50–1.10)
GFR, Est African American: >89 mL/min (ref >=60)
Glucose, Bld: 85 mg/dL (ref 65–99)
POTASSIUM: 4.4 mmol/L (ref 3.5–5.3)
SODIUM: 140 mmol/L (ref 135–146)

## 2015-03-30 LAB — CBC WITH DIFFERENTIAL/PLATELET
BASOS ABS: 0 10*3/uL (ref 0.0–0.1)
BASOS PCT: 0 % (ref 0–1)
EOS ABS: 0.2 10*3/uL (ref 0.0–0.7)
Eosinophils Relative: 2 % (ref 0–5)
HCT: 37.6 % (ref 36.0–46.0)
HEMOGLOBIN: 12.7 g/dL (ref 12.0–15.0)
Lymphocytes Relative: 21 % (ref 12–46)
Lymphs Abs: 2.1 10*3/uL (ref 0.7–4.0)
MCH: 28.4 pg (ref 26.0–34.0)
MCHC: 33.8 g/dL (ref 30.0–36.0)
MCV: 84.1 fL (ref 78.0–100.0)
MPV: 9.3 fL (ref 8.6–12.4)
Monocytes Absolute: 0.7 10*3/uL (ref 0.1–1.0)
Monocytes Relative: 7 % (ref 3–12)
NEUTROS ABS: 7.1 10*3/uL (ref 1.7–7.7)
NEUTROS PCT: 70 % (ref 43–77)
PLATELETS: 365 10*3/uL (ref 150–400)
RBC: 4.47 MIL/uL (ref 3.87–5.11)
RDW: 13.8 % (ref 11.5–15.5)
WBC: 10.2 10*3/uL (ref 4.0–10.5)

## 2015-04-02 ENCOUNTER — Ambulatory Visit
Admission: RE | Admit: 2015-04-02 | Discharge: 2015-04-02 | Disposition: A | Discharge status: Home / Self Care | Payer: BLUE CROSS/BLUE SHIELD | Source: Ambulatory Visit | Attending: Physician Assistant | Admitting: Physician Assistant

## 2015-04-02 DIAGNOSIS — R1011 Right upper quadrant pain: Secondary | ICD-10-CM

## 2015-04-05 ENCOUNTER — Other Ambulatory Visit: Payer: BLUE CROSS/BLUE SHIELD

## 2015-04-13 ENCOUNTER — Encounter: Payer: Self-pay | Admitting: Physician Assistant

## 2015-04-14 ENCOUNTER — Other Ambulatory Visit: Payer: Self-pay | Admitting: Physician Assistant

## 2015-04-14 DIAGNOSIS — R1011 Right upper quadrant pain: Secondary | ICD-10-CM

## 2015-04-14 DIAGNOSIS — K760 Fatty (change of) liver, not elsewhere classified: Secondary | ICD-10-CM

## 2015-04-22 ENCOUNTER — Other Ambulatory Visit: Payer: Self-pay | Admitting: Physician Assistant

## 2015-05-06 ENCOUNTER — Encounter: Payer: Self-pay | Admitting: Physician Assistant

## 2015-05-18 ENCOUNTER — Encounter: Payer: Self-pay | Admitting: Physician Assistant

## 2015-06-01 ENCOUNTER — Encounter: Payer: Self-pay | Admitting: Physician Assistant

## 2015-06-01 MED ORDER — HEPATITIS A-HEP B RECOMB VAC 720-20 ELU-MCG/ML IM SUSP: 1.0000 mL | Freq: Once | INTRAMUSCULAR | Status: DC

## 2015-07-01 ENCOUNTER — Ambulatory Visit: Payer: Self-pay | Admitting: Physician Assistant

## 2015-07-08 ENCOUNTER — Other Ambulatory Visit: Payer: Self-pay | Admitting: Internal Medicine

## 2015-08-03 ENCOUNTER — Ambulatory Visit (INDEPENDENT_AMBULATORY_CARE_PROVIDER_SITE_OTHER): Payer: BLUE CROSS/BLUE SHIELD | Admitting: Internal Medicine

## 2015-08-03 ENCOUNTER — Ambulatory Visit: Payer: Self-pay | Admitting: Physician Assistant

## 2015-08-03 ENCOUNTER — Encounter: Payer: Self-pay | Admitting: Internal Medicine

## 2015-08-03 VITALS — BP 126/74 | HR 78 | Temp 97.5°F | Resp 16 | Ht 65.0 in | Wt 285.0 lb

## 2015-08-03 DIAGNOSIS — E782 Mixed hyperlipidemia: Secondary | ICD-10-CM

## 2015-08-03 DIAGNOSIS — K219 Gastro-esophageal reflux disease without esophagitis: Secondary | ICD-10-CM

## 2015-08-03 DIAGNOSIS — I10 Essential (primary) hypertension: Secondary | ICD-10-CM | POA: Diagnosis not present

## 2015-08-03 DIAGNOSIS — E559 Vitamin D deficiency, unspecified: Secondary | ICD-10-CM | POA: Diagnosis not present

## 2015-08-03 DIAGNOSIS — Z79899 Other long term (current) drug therapy: Secondary | ICD-10-CM | POA: Diagnosis not present

## 2015-08-03 DIAGNOSIS — R7303 Prediabetes: Secondary | ICD-10-CM | POA: Diagnosis not present

## 2015-08-03 DIAGNOSIS — Z789 Other specified health status: Secondary | ICD-10-CM

## 2015-08-03 MED ORDER — METHOCARBAMOL 500 MG PO TABS: ORAL_TABLET | ORAL | Status: DC

## 2015-08-03 NOTE — Progress Notes (Signed)
Assessment and Plan:  Hypertension:  -Continue medication,  -monitor blood pressure at home.  -Continue DASH diet.   -Reminder to go to the ER if any CP, SOB, nausea, dizziness, severe HA, changes vision/speech, left arm numbness and tingling, and jaw pain.  Cholesterol: -Continue diet and exercise.  -Check cholesterol.   Pre-diabetes: -Continue diet and exercise.  -Check A1C  Vitamin D Def: -check level -continue medications.   GERD -cont protonix -consider tapering off in 3 months with use of ranitidine  Allergic rhinitis -cont flonase -alternate antihistamines to prevent tolerance  Morbid obesity -cont diet and exercise -recommended increased fruits and veggies -cut back on starches and sweets and meat  Continue diet and meds as discussed. Further disposition pending results of labs.  HPI 45 y.o. female  presents for 3 month follow up with hypertension, hyperlipidemia, prediabetes and vitamin D.   Her blood pressure has been controlled at home, today their BP is BP: 126/74 mmHg.   She does workout. She denies chest pain, shortness of breath, dizziness.   She is not on cholesterol medication and denies myalgias. Her cholesterol is at goal. The cholesterol last visit was:   Lab Results  Component Value Date   CHOL 187 01/20/2015   HDL 49 01/20/2015   LDLCALC 105 01/20/2015   LDLDIRECT 134.4 07/26/2011   TRIG 163* 01/20/2015   CHOLHDL 3.8 01/20/2015     She has been working on diet and exercise for prediabetes, and denies foot ulcerations, hyperglycemia, hypoglycemia , increased appetite, nausea, paresthesia of the feet, polydipsia, polyuria, visual disturbances, vomiting and weight loss. Last A1C in the office was:  Lab Results  Component Value Date   HGBA1C 5.5 01/20/2015    Patient is on Vitamin D supplement.  Lab Results  Component Value Date   VD25OH 25* 01/20/2015     She reports that she has had complete relief of the right side pain.  She notes  that she has finished the carafate.  She did not follow-up with GI as pain resolved.  She is still taking protonix daily.  She did try to stop but got bad rebound reflux.    Allergies are doing better.  She is rotating antihistamines so that she does not have a tolerance which works better.    Current Medications:  Current Outpatient Prescriptions on File Prior to Visit  Medication Sig Dispense Refill  . buPROPion (WELLBUTRIN XL) 150 MG 24 hr tablet TAKE 1 TABLET BY MOUTH EVERY MORNING. 30 tablet 5  . celecoxib (CELEBREX) 200 MG capsule TAKE 1 CAPSULE (200 MG TOTAL) BY MOUTH 2 (TWO) TIMES DAILY. FOR PAIN &INFLAMMATION 60 capsule 1  . Cholecalciferol (VITAMIN D PO) Take 2,000 Units by mouth daily.     . fluticasone (FLONASE) 50 MCG/ACT nasal spray Place 2 sprays into both nostrils at bedtime.    . hepatitis A-hepatitis B (TWINRIX) 720-20 ELU-MCG/ML injection Inject 1 mL into the muscle once. 0.5 mL 2  . levocetirizine (XYZAL) 5 MG tablet TAKE 1 TABLET BY MOUTH DAILY 30 tablet 1  . methocarbamol (ROBAXIN) 500 MG tablet TAKE 1 TABLET (500 MG TOTAL) BY MOUTH 3 (THREE) TIMES DAILY AS NEEDED FOR MUSCLE SPASMS. 90 tablet 1  . Multiple Vitamin (MULTIVITAMIN) capsule Take 1 capsule by mouth daily.    . pantoprazole (PROTONIX) 40 MG tablet TAKE 1 TABLET (40 MG TOTAL) BY MOUTH DAILY. 90 tablet 0  . pregabalin (LYRICA) 150 MG capsule TAKE 1 CAPSULE BY MOUTH 3 TIMES DAILY 270 capsule 3  No current facility-administered medications on file prior to visit.    Medical History:  Past Medical History  Diagnosis Date  . Allergic rhinitis   . Hypertension   . Obesity   . Chronic low back pain   . 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 OSA on CPAP     does not use CPAP  . GERD (gastroesophageal reflux disease)   . Headache     migraine  . Arthritis   . Anemia     Allergies: No Known Allergies   Review of Systems:  Review of Systems  Constitutional: Negative for fever, chills and malaise/fatigue.   HENT: Negative for congestion, ear pain and sore throat.   Eyes: Negative.   Respiratory: Negative for cough, shortness of breath and wheezing.   Cardiovascular: Negative for chest pain, palpitations and leg swelling.  Gastrointestinal: Negative for heartburn, abdominal pain, diarrhea, constipation, blood in stool and melena.  Genitourinary: Negative.   Skin: Negative.   Neurological: Negative for dizziness, sensory change, loss of consciousness and headaches.  Psychiatric/Behavioral: Negative for depression. The patient is not nervous/anxious and does not have insomnia.     Family history- Review and unchanged  Social history- Review and unchanged  Physical Exam: BP 126/74 mmHg  Pulse 78  Temp(Src) 97.5 F (36.4 C) (Temporal)  Resp 16  Ht  (1.651 m)  Wt 285 lb (129.275 kg)  BMI 47.43 kg/m2  SpO2 98%  LMP 07/22/2015 Wt Readings from Last 3 Encounters:  08/03/15 285 lb (129.275 kg)  03/29/15 274 lb 3.2 oz (124.376 kg)  01/20/15 273 lb (123.832 kg)    General Appearance: Well nourished well developed, in no apparent distress. Eyes: PERRLA, EOMs, conjunctiva no swelling or erythema ENT/Mouth: Ear canals normal without obstruction, swelling, erythma, discharge.  TMs normal bilaterally.  Oropharynx moist, clear, without exudate, or postoropharyngeal swelling. Neck: Supple, thyroid normal,no cervical adenopathy  Respiratory: Respiratory effort normal, Breath sounds clear A&P without rhonchi, wheeze, or rale.  No retractions, no accessory usage. Cardio: RRR with no MRGs. Brisk peripheral pulses without edema.  Abdomen: Soft, + BS,  Non tender, no guarding, rebound, hernias, masses. Musculoskeletal: Full ROM, 5/5 strength, Normal gait Skin: Warm, dry without rashes, lesions, ecchymosis.  Neuro: Awake and oriented X 3, Cranial nerves intact. Normal muscle tone, no cerebellar symptoms. Psych: Normal affect, Insight and Judgment appropriate.    Terri Piedra, PA-C 4:53  PM [facility]

## 2015-08-04 LAB — CBC WITH DIFFERENTIAL/PLATELET
BASOS PCT: 0 %
Basophils Absolute: 0 cells/uL (ref 0–200)
EOS ABS: 228 {cells}/uL (ref 15–500)
Eosinophils Relative: 2 %
HEMATOCRIT: 37.7 % (ref 35.0–45.0)
HEMOGLOBIN: 12.4 g/dL (ref 11.7–15.5)
LYMPHS ABS: 2622 {cells}/uL (ref 850–3900)
Lymphocytes Relative: 23 %
MCH: 27.4 pg (ref 27.0–33.0)
MCHC: 32.9 g/dL (ref 32.0–36.0)
MCV: 83.4 fL (ref 80.0–100.0)
MONO ABS: 798 {cells}/uL (ref 200–950)
MPV: 9 fL (ref 7.5–12.5)
Monocytes Relative: 7 %
NEUTROS PCT: 68 %
Neutro Abs: 7752 cells/uL (ref 1500–7800)
Platelets: 417 10*3/uL — ABNORMAL HIGH (ref 140–400)
RBC: 4.52 MIL/uL (ref 3.80–5.10)
RDW: 14.3 % (ref 11.0–15.0)
WBC: 11.4 10*3/uL — AB (ref 3.8–10.8)

## 2015-08-04 LAB — BASIC METABOLIC PANEL WITH GFR
BUN: 13 mg/dL (ref 7–25)
CO2: 26 mmol/L (ref 20–31)
Calcium: 9.2 mg/dL (ref 8.6–10.2)
Chloride: 102 mmol/L (ref 98–110)
Creat: 0.74 mg/dL (ref 0.50–1.10)
GFR, Est African American: >89 mL/min (ref >=60)
GLUCOSE: 73 mg/dL (ref 65–99)
POTASSIUM: 4.3 mmol/L (ref 3.5–5.3)
Sodium: 137 mmol/L (ref 135–146)

## 2015-08-04 LAB — HEMOGLOBIN A1C
Hgb A1c MFr Bld: 5.7 % — ABNORMAL HIGH (ref <5.7)
MEAN PLASMA GLUCOSE: 117 mg/dL

## 2015-08-04 LAB — LIPID PANEL
Cholesterol: 187 mg/dL (ref 125–200)
HDL: 49 mg/dL (ref >=46)
LDL Cholesterol: 112 mg/dL (ref <130)
TRIGLYCERIDES: 132 mg/dL (ref <150)
Total CHOL/HDL Ratio: 3.8 Ratio (ref <=5.0)
VLDL: 26 mg/dL (ref <30)

## 2015-08-04 LAB — HEPATIC FUNCTION PANEL
ALK PHOS: 79 U/L (ref 33–115)
ALT: 10 U/L (ref 6–29)
AST: 14 U/L (ref 10–30)
Albumin: 4.1 g/dL (ref 3.6–5.1)
BILIRUBIN INDIRECT: 0.2 mg/dL (ref 0.2–1.2)
Bilirubin, Direct: 0.1 mg/dL (ref <=0.2)
TOTAL PROTEIN: 7.1 g/dL (ref 6.1–8.1)
Total Bilirubin: 0.3 mg/dL (ref 0.2–1.2)

## 2015-08-04 LAB — TSH: TSH: 3.4 m[IU]/L

## 2015-08-04 LAB — HEPATITIS B SURFACE ANTIBODY,QUALITATIVE: Hep B S Ab: NEGATIVE

## 2015-09-30 ENCOUNTER — Other Ambulatory Visit: Payer: Self-pay | Admitting: Physician Assistant

## 2015-10-21 ENCOUNTER — Other Ambulatory Visit: Payer: Self-pay | Admitting: Physician Assistant

## 2015-11-08 ENCOUNTER — Other Ambulatory Visit: Payer: Self-pay | Admitting: Internal Medicine

## 2015-12-07 ENCOUNTER — Encounter: Payer: Self-pay | Admitting: Physician Assistant

## 2015-12-09 ENCOUNTER — Ambulatory Visit: Payer: Self-pay | Admitting: Physician Assistant

## 2015-12-09 ENCOUNTER — Ambulatory Visit (INDEPENDENT_AMBULATORY_CARE_PROVIDER_SITE_OTHER): Payer: BLUE CROSS/BLUE SHIELD | Admitting: Physician Assistant

## 2015-12-09 ENCOUNTER — Encounter: Payer: Self-pay | Admitting: Physician Assistant

## 2015-12-09 VITALS — BP 128/64 | HR 93 | Temp 97.5°F | Resp 16 | Ht 65.0 in | Wt 293.8 lb

## 2015-12-09 DIAGNOSIS — R7303 Prediabetes: Secondary | ICD-10-CM | POA: Diagnosis not present

## 2015-12-09 DIAGNOSIS — Z79899 Other long term (current) drug therapy: Secondary | ICD-10-CM

## 2015-12-09 DIAGNOSIS — E559 Vitamin D deficiency, unspecified: Secondary | ICD-10-CM | POA: Diagnosis not present

## 2015-12-09 DIAGNOSIS — Z23 Encounter for immunization: Secondary | ICD-10-CM | POA: Diagnosis not present

## 2015-12-09 DIAGNOSIS — I10 Essential (primary) hypertension: Secondary | ICD-10-CM | POA: Diagnosis not present

## 2015-12-09 DIAGNOSIS — E782 Mixed hyperlipidemia: Secondary | ICD-10-CM

## 2015-12-09 LAB — CBC WITH DIFFERENTIAL/PLATELET
BASOS ABS: 0 {cells}/uL (ref 0–200)
Basophils Relative: 0 %
EOS PCT: 2 %
Eosinophils Absolute: 238 cells/uL (ref 15–500)
HCT: 36.2 % (ref 35.0–45.0)
Hemoglobin: 11.9 g/dL (ref 11.7–15.5)
Lymphocytes Relative: 24 %
Lymphs Abs: 2856 cells/uL (ref 850–3900)
MCH: 27.2 pg (ref 27.0–33.0)
MCHC: 32.9 g/dL (ref 32.0–36.0)
MCV: 82.6 fL (ref 80.0–100.0)
MONOS PCT: 7 %
MPV: 9.5 fL (ref 7.5–12.5)
Monocytes Absolute: 833 cells/uL (ref 200–950)
NEUTROS ABS: 7973 {cells}/uL — AB (ref 1500–7800)
NEUTROS PCT: 67 %
PLATELETS: 392 10*3/uL (ref 140–400)
RBC: 4.38 MIL/uL (ref 3.80–5.10)
RDW: 14.4 % (ref 11.0–15.0)
WBC: 11.9 10*3/uL — AB (ref 3.8–10.8)

## 2015-12-09 MED ORDER — FLUTICASONE PROPIONATE 50 MCG/ACT NA SUSP: 2.0000 | Freq: Every day | NASAL | 3 refills | Status: DC

## 2015-12-09 MED ORDER — LEVOCETIRIZINE DIHYDROCHLORIDE 5 MG PO TABS: 5.0000 mg | ORAL_TABLET | Freq: Every day | ORAL | 3 refills | Status: DC

## 2015-12-09 NOTE — Progress Notes (Signed)
Assessment and Plan:  Hypertension:  -Continue medication,  -monitor blood pressure at home.  -Continue DASH diet.   -Reminder to go to the ER if any CP, SOB, nausea, dizziness, severe HA, changes vision/speech, left arm numbness and tingling, and jaw pain.  Cholesterol: -Continue diet and exercise.  -Check cholesterol.   Pre-diabetes: -Continue diet and exercise.  -Check A1C  Vitamin D Def: -check level -continue medications.   Morbid obesity -cont diet and exercise -- better breakfast, eat lunch, more veggies - discussed weight loss meds  Continue diet and meds as discussed. Further disposition pending results of labs.  HPI 45 y.o. female  presents for 3 month follow up with hypertension, hyperlipidemia, prediabetes and vitamin D.   Her blood pressure has been controlled at home, today their BP is BP: 128/64.   She does workout. She denies chest pain, shortness of breath, dizziness.   She is not on cholesterol medication and denies myalgias. Her cholesterol is at goal. The cholesterol last visit was:   Lab Results  Component Value Date   CHOL 187 08/03/2015   HDL 49 08/03/2015   LDLCALC 112 08/03/2015   LDLDIRECT 134.4 07/26/2011   TRIG 132 08/03/2015   CHOLHDL 3.8 08/03/2015    She has been working on diet and exercise for prediabetes, and denies foot ulcerations, hyperglycemia, hypoglycemia , increased appetite, nausea, paresthesia of the feet, polydipsia, polyuria, visual disturbances, vomiting and weight loss. Last A1C in the office was:  Lab Results  Component Value Date   HGBA1C 5.7 (H) 08/03/2015   Patient is on Vitamin D supplement, 1000 IU.  Lab Results  Component Value Date   VD25OH 25 (L) 01/20/2015     BMI is Body mass index is 48.89 kg/m., she is working on diet and exercise, she has changed jobs recently, which is increase of stress and driving 1 hour a day. Still doing well with back surgery.  Wt Readings from Last 3 Encounters:  12/09/15 293 lb  12.8 oz (133.3 kg)  08/03/15 285 lb (129.3 kg)  03/29/15 274 lb 3.2 oz (124.4 kg)     Current Medications:  Current Outpatient Prescriptions on File Prior to Visit  Medication Sig Dispense Refill  . Cholecalciferol (VITAMIN D PO) Take 2,000 Units by mouth daily.     . fluticasone (FLONASE) 50 MCG/ACT nasal spray Place 2 sprays into both nostrils at bedtime.    . hepatitis A-hepatitis B (TWINRIX) 720-20 ELU-MCG/ML injection Inject 1 mL into the muscle once. 0.5 mL 2  . levocetirizine (XYZAL) 5 MG tablet TAKE 1 TABLET BY MOUTH DAILY 30 tablet 1  . methocarbamol (ROBAXIN) 500 MG tablet TAKE 1 TABLET (500 MG TOTAL) BY MOUTH 3 (THREE) TIMES DAILY AS NEEDED FOR MUSCLE SPASMS. 90 tablet 1  . Multiple Vitamin (MULTIVITAMIN) capsule Take 1 capsule by mouth daily.    . pantoprazole (PROTONIX) 40 MG tablet TAKE ONE TABLET BY MOUTH DAILY 90 tablet 0  . pregabalin (LYRICA) 150 MG capsule TAKE 1 CAPSULE BY MOUTH 3 TIMES DAILY 270 capsule 3   No current facility-administered medications on file prior to visit.     Medical History:  Past Medical History:  Diagnosis Date  . Allergic rhinitis   . Anemia   . Arthritis   . Chronic low back pain   . GERD (gastroesophageal reflux disease)   . Headache    migraine  . Hypertension   . 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 Obesity   . OSA on CPAP  does not use CPAP    Allergies: No Known Allergies   Review of Systems:  Review of Systems  Constitutional: Negative for chills, fever and malaise/fatigue.  HENT: Negative for congestion, ear pain and sore throat.   Eyes: Negative.   Respiratory: Negative for cough, shortness of breath and wheezing.   Cardiovascular: Negative for chest pain, palpitations and leg swelling.  Gastrointestinal: Negative for abdominal pain, blood in stool, constipation, diarrhea, heartburn and melena.  Genitourinary: Negative.   Skin: Negative.   Neurological: Negative for dizziness, sensory change, loss of consciousness  and headaches.  Psychiatric/Behavioral: Negative for depression. The patient is not nervous/anxious and does not have insomnia.     Family history- Review and unchanged  Social history- Review and unchanged  Physical Exam: BP 128/64   Pulse 93   Temp 97.5 F (36.4 C)   Resp 16   Ht 5\' 5"  (1.651 m)   Wt 293 lb 12.8 oz (133.3 kg)   LMP 11/26/2015   SpO2 99%   BMI 48.89 kg/m  Wt Readings from Last 3 Encounters:  12/09/15 293 lb 12.8 oz (133.3 kg)  08/03/15 285 lb (129.3 kg)  03/29/15 274 lb 3.2 oz (124.4 kg)    General Appearance: Well nourished well developed, in no apparent distress. Eyes: PERRLA, EOMs, conjunctiva no swelling or erythema ENT/Mouth: Ear canals normal without obstruction, swelling, erythma, discharge.  TMs normal bilaterally.  Oropharynx moist, clear, without exudate, or postoropharyngeal swelling. Neck: Supple, thyroid normal,no cervical adenopathy  Respiratory: Respiratory effort normal, Breath sounds clear A&P without rhonchi, wheeze, or rale.  No retractions, no accessory usage. Cardio: RRR with no MRGs. Brisk peripheral pulses without edema.  Abdomen: Soft, + BS,  Non tender, no guarding, rebound, hernias, masses. Musculoskeletal: Full ROM, 5/5 strength, Normal gait Skin: Warm, dry without rashes, lesions, ecchymosis.  Neuro: Awake and oriented X 3, Cranial nerves intact. Normal muscle tone, no cerebellar symptoms. Psych: Normal affect, Insight and Judgment appropriate.    Emily MullingAmanda Tresia Revolorio, PA-C 4:12 PM [facility]

## 2015-12-09 NOTE — Patient Instructions (Addendum)
Vitamin D goal is between 60-80  GET THE VITAMIN D 5000  Please make sure that you are taking your Vitamin D as directed.   It is very important as a natural anti-inflammatory   helping hair, skin, and nails, as well as reducing stroke and heart attack risk.   It helps your bones and helps with mood.  It also decreases numerous cancer risks so please take it as directed.   Low Vit D is associated with a 200-300% higher risk for CANCER   and 200-300% higher risk for HEART   ATTACK  &  STROKE.    .....................................Marland Kitchen.  It is also associated with higher death rate at younger ages,   autoimmune diseases like Rheumatoid arthritis, Lupus, Multiple Sclerosis.     Also many other serious conditions, like depression, Alzheimer's  Dementia, infertility, muscle aches, fatigue, fibromyalgia - just to name a few.  +++++++++++++++++++  Can get liquid vitamin D from [facility]  OR [place] in [place] at  [facility] [address], [place], [place] [id number]  Who Qualifies for Obesity Medications? Although everyone is hopeful for a fast and easy way to lose weight, nothing has been shown to replace a prudent, calorie-controlled diet along with behavior modification as a cornerstone for all obesity treatments.  The next tool that can be used to achieve weight-loss and health improvement is medication. Pharmacotherapy may be offered to individuals affected by obesity who have failed to achieve weight-loss through diet and exercise alone. Currently there are several drugs that are approved by the FDA for weight-loss: phentermine products (Adipex-P or Suprenza)  lorcaserin HCI (Belviq) phentermine- topiramate ER (Qsymia)  Bupropion; Naltrexone ER (Contrave)  Let's take a closer look at each of these medications and learn how they work:  Phentermine (Adipex-P or Suprenza) How does it work? Phentermine is a medication available by prescription that works on chemicals  in the brain to decrease your appetite. It also has a mild stimulant component that adds extra energy. Phentermine is a pill that is taken once a day in the morning time. Tolerance to this medication can develop, so it can only be used for several months at a time. Common side effects are dry mouth, sleeplessness, constipation. Weight-loss: The average weight-loss is 4-5 percent of your weight after one-year. In a 200 pound person, this means about 10 pounds of weight-loss. Patients who receive phentermine can usually expect to see greater weight-loss than those who receive non-pharmacologic care, on average about 13 pounds difference over 12 weeks as reported in one study. Concerns: Due to its stimulant effect, a person's blood pressure and heart rate may increase when on this medication; therefore, you must be monitored closely by a physician who is experienced in prescribing this medication. It cannot be used in patients with some heart conditions (such as poorly controlled blood pressure), glaucoma (increased pressure in your eye), stroke or overactive thyroid. There is some concern for abuse, but this is minimal if the medication is appropriately used as directed by a healthcare professional.  Lorcaserin (Belviq) How does it work? Lorcaserin was approved in June 2012 by the FDA and became commercially available in June 2013. It works by helping you feel full while eating less, and it works on the chemicals in your brain to help decrease your appetite. Weight-loss: In individuals who took the medication for one-year, it has been shown to have an average of 7 percent weight-loss. In a 200 pound person, this would mean a 14 pound weight-loss. Blood  sugar, cholesterol and blood pressure levels have also been shown to improve. Concerns: The most common side effects are headache, dizziness, fatigue, dry mouth, upper UJW:JXBJYNWG tract infection and nausea.  Response to therapy should be evaluated by week  12.  If a patient has not lost at least 5% of baseline body weigh  Phentermine-Topiramate ER (Qsymia) How does it work? This combination medication was approved by the FDA in July 2012. Topiramate is a medication used to treat seizures. It was found that a common side effect of this medication was weight-loss. Phentermine, as described in this brochure, helps to increase your energy and decrease your appetite. Weight-loss: Among individuals who took the highest does of Qsymia (15 mg phentermine and 92 mg of topiramate ER) for one-year, they achieved an average of 14.4 percent weight-loss. In a 200 pound person, a 14.4 percent weight-loss would mean a loss of 29 pounds. Cholesterol levels have also been shown to improve. Concerns: The most common side effects were dry mouth, constipation and pins-and-needle feeling in extremities. Qsymia should NOT be taken during pregnancy since Topiramate ER, a component of Qsymia, has been associated with an increased risk of birth defects.  Bupropion; Naltrexone ER (Contrave) How does it work? Works in two areas of your brain, hunger center and reward center to reduce hunger and cravings.  Weight loss In a 56 week trial patients lost more than 5% of their body weight.  Concerns Most common side effects are dry mouth, constipation or diarrhea, headache.  Please take it with a full glass of water and low fat meal.    Follow-up Visits: Patients are given the opportunity to revisit a topic or obtain more information on an area of interest during follow-up visits. The frequency of and interval between follow-up visits is determined on a patient-by-patient basis. Frequent visits (every 3 to 4 weeks) are encouraged until initial weight-loss goals (5 to 10 percent of body weight) are achieved. At that point, less frequent visits are typically scheduled as needed for individual patients. However, since obesity is considered a chronic life-long problem for many  individuals, periodic continual follow up is recommended.   Research has shown that weight-loss as low as 5 percent of initial body weight can lead to favorable improvements in blood pressure, cholesterol, glucose levels and insulin sensitivity. The risk of developing heart disease is reduced the most in patients who have impaired glucose tolerance, type 2 diabetes or high blood pressure.    We want weight loss that will last so you should lose 1-2 pounds a week.  THAT IS IT! Please pick THREE things a month to change. Once it is a habit check off the item. Then pick another three items off the list to become habits.  If you are already doing a habit on the list GREAT!  Cross that item off! o Don't drink your calories. Ie, alcohol, soda, fruit juice, and sweet tea.  o Drink more water. Drink a glass when you feel hungry or before each meal.  o Eat breakfast - Complex carb and protein (likeDannon light and fit yogurt, oatmeal, fruit, eggs, [place] bacon). o Measure your cereal.  Eat no more than one cup a day. (ie [place]) o Eat an apple a day. o Add a vegetable a day. o Try a new vegetable a month. o Use Pam! Stop using oil or butter to cook. o Don't finish your plate or use smaller plates. o Share your dessert. o Eat sugar free Jello for dessert  or frozen grapes. o Don't eat 2-3 hours before bed. o Switch to whole wheat bread, pasta, and brown rice. o Make healthier choices when you eat out. No fries! o Pick baked chicken, NOT fried. o Don't forget to SLOW DOWN when you eat. It is not going anywhere.  o Take the stairs. o Park far away in the parking lot o [employer] soup cans (or weights) for 10 minutes while watching TV. o Walk at work for 10 minutes during break. o Walk outside 1 time a week with your friend, kids, dog, or significant other. o Start a walking group at church. o Walk the mall as much as you can tolerate.  o Keep a food diary. o Weigh yourself daily. o Walk for 15 minutes 3  days per week. o Cook at home more often and eat out less.  If life happens and you go back to old habits, it is okay.  Just start over. You can do it!   If you experience chest pain, get short of breath, or tired during the exercise, please stop immediately and inform your doctor.

## 2015-12-10 LAB — BASIC METABOLIC PANEL WITH GFR
BUN: 13 mg/dL (ref 7–25)
CALCIUM: 9 mg/dL (ref 8.6–10.2)
CO2: 25 mmol/L (ref 20–31)
CREATININE: 0.72 mg/dL (ref 0.50–1.10)
Chloride: 103 mmol/L (ref 98–110)
GFR, Est Non African American: >89 mL/min (ref >=60)
Glucose, Bld: 81 mg/dL (ref 65–99)
Potassium: 4.2 mmol/L (ref 3.5–5.3)
SODIUM: 138 mmol/L (ref 135–146)

## 2015-12-10 LAB — HEPATIC FUNCTION PANEL
ALT: 11 U/L (ref 6–29)
AST: 15 U/L (ref 10–30)
Albumin: 4.1 g/dL (ref 3.6–5.1)
Alkaline Phosphatase: 84 U/L (ref 33–115)
BILIRUBIN DIRECT: 0 mg/dL (ref <=0.2)
BILIRUBIN TOTAL: 0.3 mg/dL (ref 0.2–1.2)
Indirect Bilirubin: 0.3 mg/dL (ref 0.2–1.2)
Total Protein: 7.1 g/dL (ref 6.1–8.1)

## 2015-12-10 LAB — LIPID PANEL
CHOL/HDL RATIO: 4.2 ratio (ref <5.0)
CHOLESTEROL: 187 mg/dL (ref <200)
HDL: 45 mg/dL — AB (ref >50)
LDL Cholesterol: 109 mg/dL — ABNORMAL HIGH (ref <100)
TRIGLYCERIDES: 163 mg/dL — AB (ref <150)
VLDL: 33 mg/dL — ABNORMAL HIGH (ref <30)

## 2015-12-10 LAB — HEMOGLOBIN A1C
Hgb A1c MFr Bld: 5.4 % (ref <5.7)
MEAN PLASMA GLUCOSE: 108 mg/dL

## 2015-12-10 LAB — MAGNESIUM: Magnesium: 2 mg/dL (ref 1.5–2.5)

## 2015-12-10 LAB — TSH: TSH: 3.08 m[IU]/L

## 2016-01-27 ENCOUNTER — Encounter: Payer: Self-pay | Admitting: Physician Assistant

## 2016-03-15 ENCOUNTER — Other Ambulatory Visit: Payer: Self-pay | Admitting: Internal Medicine

## 2016-03-21 ENCOUNTER — Encounter: Payer: Self-pay | Admitting: Physician Assistant

## 2016-04-06 ENCOUNTER — Encounter: Payer: Self-pay | Admitting: Physician Assistant

## 2016-04-06 ENCOUNTER — Encounter: Payer: Self-pay | Admitting: *Deleted

## 2016-04-26 ENCOUNTER — Encounter: Payer: Self-pay | Admitting: Physician Assistant

## 2016-04-26 ENCOUNTER — Ambulatory Visit (INDEPENDENT_AMBULATORY_CARE_PROVIDER_SITE_OTHER): Payer: BLUE CROSS/BLUE SHIELD | Admitting: Physician Assistant

## 2016-04-26 VITALS — BP 120/80 | HR 72 | Temp 97.5°F | Resp 16 | Ht 65.0 in | Wt 291.0 lb

## 2016-04-26 DIAGNOSIS — R1013 Epigastric pain: Secondary | ICD-10-CM

## 2016-04-26 DIAGNOSIS — R197 Diarrhea, unspecified: Secondary | ICD-10-CM | POA: Diagnosis not present

## 2016-04-26 LAB — CBC WITH DIFFERENTIAL/PLATELET
BASOS ABS: 0 {cells}/uL (ref 0–200)
BASOS PCT: 0 %
EOS ABS: 212 {cells}/uL (ref 15–500)
Eosinophils Relative: 2 %
HEMATOCRIT: 36.5 % (ref 35.0–45.0)
HEMOGLOBIN: 12 g/dL (ref 11.7–15.5)
Lymphocytes Relative: 25 %
Lymphs Abs: 2650 cells/uL (ref 850–3900)
MCH: 27.1 pg (ref 27.0–33.0)
MCHC: 32.9 g/dL (ref 32.0–36.0)
MCV: 82.6 fL (ref 80.0–100.0)
MONO ABS: 636 {cells}/uL (ref 200–950)
MPV: 9.6 fL (ref 7.5–12.5)
Monocytes Relative: 6 %
NEUTROS ABS: 7102 {cells}/uL (ref 1500–7800)
Neutrophils Relative %: 67 %
Platelets: 407 10*3/uL — ABNORMAL HIGH (ref 140–400)
RBC: 4.42 MIL/uL (ref 3.80–5.10)
RDW: 14.5 % (ref 11.0–15.0)
WBC: 10.6 10*3/uL (ref 3.8–10.8)

## 2016-04-26 LAB — HEPATIC FUNCTION PANEL
ALBUMIN: 4 g/dL (ref 3.6–5.1)
ALK PHOS: 71 U/L (ref 33–115)
ALT: 12 U/L (ref 6–29)
AST: 14 U/L (ref 10–30)
BILIRUBIN TOTAL: 0.3 mg/dL (ref 0.2–1.2)
Bilirubin, Direct: 0.1 mg/dL (ref <=0.2)
Indirect Bilirubin: 0.2 mg/dL (ref 0.2–1.2)
TOTAL PROTEIN: 7.1 g/dL (ref 6.1–8.1)

## 2016-04-26 LAB — BASIC METABOLIC PANEL WITH GFR
BUN: 9 mg/dL (ref 7–25)
CHLORIDE: 104 mmol/L (ref 98–110)
CO2: 24 mmol/L (ref 20–31)
Calcium: 9.2 mg/dL (ref 8.6–10.2)
Creat: 0.65 mg/dL (ref 0.50–1.10)
GFR, Est African American: >89 mL/min (ref >=60)
GFR, Est Non African American: >89 mL/min (ref >=60)
GLUCOSE: 67 mg/dL (ref 65–99)
POTASSIUM: 4.6 mmol/L (ref 3.5–5.3)
Sodium: 138 mmol/L (ref 135–146)

## 2016-04-26 MED ORDER — PROMETHAZINE HCL 25 MG PO TABS: 25.0000 mg | ORAL_TABLET | Freq: Four times a day (QID) | ORAL | 3 refills | Status: DC | PRN

## 2016-04-26 MED ORDER — SUCRALFATE 1 G PO TABS: 1.0000 g | ORAL_TABLET | Freq: Four times a day (QID) | ORAL | 1 refills | Status: DC

## 2016-04-26 MED ORDER — ONDANSETRON HCL 4 MG PO TABS: 4.0000 mg | ORAL_TABLET | Freq: Every day | ORAL | 1 refills | Status: DC | PRN

## 2016-04-26 NOTE — Progress Notes (Signed)
Subjective:    Patient ID: Emily Gay, female    DOB: 07-23-1970, 46 y.o.   MRN: [id number]  HPI 46 y.o. obese WF presents with stomach and diarrhea x last Friday March 30th. Had fatigue, had diarrhea Saturday, went back to work Monday.    Will have epigastric pain, intermittent, better with carafate, but has been worse with last 2 days, epigastric pain occ radiation to right side, never to back, burning sensation. Has been doing bland foods, was doing better until last night had severe epigastric pain that spread across her stomach, worse with movement. She is on protonix, will occ miss. Has been off NSAIDS.S/p choley No fever, chills. Has felt "run down" . No black stools, blood in stools.   Blood pressure 120/80, pulse 72, temperature 97.5 F (36.4 C), resp. rate 16, height  (1.651 m), weight 291 lb (132 kg), last menstrual period 03/28/2016, SpO2 97 %.  Medications Current Outpatient Prescriptions on File Prior to Visit  Medication Sig  . Cholecalciferol (VITAMIN D PO) Take 2,000 Units by mouth daily.   . fluticasone (FLONASE) 50 MCG/ACT nasal spray Place 2 sprays into both nostrils at bedtime.  . hepatitis A-hepatitis B (TWINRIX) 720-20 ELU-MCG/ML injection Inject 1 mL into the muscle once.  Marland Kitchen levocetirizine (XYZAL) 5 MG tablet Take 1 tablet (5 mg total) by mouth daily.  . methocarbamol (ROBAXIN) 500 MG tablet TAKE 1 TABLET (500 MG TOTAL) BY MOUTH 3 (THREE) TIMES DAILY AS NEEDED FOR MUSCLE SPASMS.  . Multiple Vitamin (MULTIVITAMIN) capsule Take 1 capsule by mouth daily.  . pantoprazole (PROTONIX) 40 MG tablet TAKE ONE TABLET BY MOUTH DAILY  . pregabalin (LYRICA) 150 MG capsule TAKE 1 CAPSULE BY MOUTH 3 TIMES DAILY   No current facility-administered medications on file prior to visit.     Problem list She has Morbid obesity (BMI 50); ANEMIA, MILD; Essential hypertension; ALLERGIC RHINITIS; TMJ PAIN; Calculus of gallbladder; Vitamin D deficiency; GERD; Hyperlipidemia;  Prediabetes; Encounter for long-term (current) use of medications; Insulin resistance syndrome; Headache; Nocturnal myoclonus; OSA (obstructive sleep apnea); and Radiculopathy on her problem list.   Review of Systems  Constitutional: Positive for fatigue. Negative for chills and fever.  HENT: Negative.  Negative for sore throat.   Eyes: Negative.   Respiratory: Negative for cough, choking, shortness of breath and wheezing.   Cardiovascular: Negative.  Negative for chest pain.  Gastrointestinal: Positive for abdominal distention, abdominal pain, diarrhea and nausea. Negative for anal bleeding, blood in stool, constipation, rectal pain and vomiting.  Genitourinary: Negative.   Musculoskeletal: Negative.   Skin: Negative.   Neurological: Negative.   Hematological: Negative.   Psychiatric/Behavioral: Negative.        Objective:   Physical Exam  Constitutional: She is oriented to person, place, and time. She appears well-developed and well-nourished.  HENT:  Head: Normocephalic and atraumatic.  Right Ear: External ear normal.  Left Ear: External ear normal.  Mouth/Throat: Oropharynx is clear and moist.  Eyes: Conjunctivae and EOM are normal. Pupils are equal, round, and reactive to light.  Neck: Normal range of motion. Neck supple. No thyromegaly present.  Cardiovascular: Normal rate, regular rhythm and normal heart sounds.  Exam reveals no gallop and no friction rub.   No murmur heard. Pulmonary/Chest: Effort normal and breath sounds normal. No respiratory distress. She has no wheezes.  Abdominal: Soft. Bowel sounds are normal. She exhibits no shifting dullness, no distension, no abdominal bruit, no pulsatile midline mass and no mass. There is no  hepatosplenomegaly. There is generalized tenderness (diffuse pain but worse RUQ, no rebound). There is guarding and CVA tenderness. There is no rigidity, no rebound, no tenderness at McBurney's point and negative Murphy's sign. No hernia.   Musculoskeletal: Normal range of motion.  Lymphadenopathy:    She has no cervical adenopathy.  Neurological: She is alert and oriented to person, place, and time.  Skin: Skin is warm and dry.  Psychiatric: She has a normal mood and affect.      Assessment & Plan:  Epigastric pain Check labs, refer to GI for EGD  - CBC with Differential/Platelet - BASIC METABOLIC PANEL WITH GFR - Hepatic function panel - Amylase - Ambulatory referral to Gastroenterology - Hepatitis Acute Panel - ondansetron (ZOFRAN) 4 MG tablet; Take 1 tablet (4 mg total) by mouth daily as needed for nausea or vomiting (not sedatin).  Dispense: 30 tablet; Refill: 1 - promethazine (PHENERGAN) 25 MG tablet; Take 1 tablet (25 mg total) by mouth every 6 (six) hours as needed for nausea or vomiting. sedating  Dispense: 60 tablet; Refill: 3 - sucralfate (CARAFATE) 1 g tablet; Take 1 tablet (1 g total) by mouth 4 (four) times daily.  Dispense: 120 tablet; Refill: 1  Diarrhea, unspecified type ? Viral with viral enteritis, check labs, bland foods - CBC with Differential/Platelet - BASIC METABOLIC PANEL WITH GFR - Hepatic function panel - Hepatitis Acute Panel  The patient was advised to call immediately if she has any concerning symptoms in the interval. The patient voices understanding of current treatment options and is in agreement with the current care plan.The patient knows to call the clinic with any problems, questions or concerns or go to the ER if any further progression of symptoms.

## 2016-04-26 NOTE — Patient Instructions (Signed)
Heartburn Heartburn is a type of pain or discomfort that can happen in the throat or chest. It is often described as a burning pain. It may also cause a bad taste in the mouth. Heartburn may feel worse when you lie down or bend over, and it is often worse at night. Heartburn may be caused by stomach contents that move back up into the esophagus (reflux). Follow these instructions at home: Take these actions to decrease your discomfort and to help avoid complications. Diet   Follow a diet as recommended by your health care provider. This may involve avoiding foods and drinks such as:  Coffee and tea (with or without caffeine).  Drinks that contain alcohol.  Energy drinks and sports drinks.  Carbonated drinks or sodas.  Chocolate and cocoa.  Peppermint and mint flavorings.  Garlic and onions.  Horseradish.  Spicy and acidic foods, including peppers, chili powder, curry powder, vinegar, hot sauces, and barbecue sauce.  Citrus fruit juices and citrus fruits, such as oranges, lemons, and limes.  Tomato-based foods, such as red sauce, chili, salsa, and pizza with red sauce.  Fried and fatty foods, such as donuts, french fries, potato chips, and high-fat dressings.  High-fat meats, such as hot dogs and fatty cuts of red and white meats, such as rib eye steak, sausage, ham, and bacon.  High-fat dairy items, such as whole milk, butter, and cream cheese.  Eat small, frequent meals instead of large meals.  Avoid drinking large amounts of liquid with your meals.  Avoid eating meals during the 2-3 hours before bedtime.  Avoid lying down right after you eat.  Do not exercise right after you eat. General instructions   Pay attention to any changes in your symptoms.  Take over-the-counter and prescription medicines only as told by your health care provider. Do not take aspirin, ibuprofen, or other NSAIDs unless your health care provider told you to do so.  Do not use any tobacco  products, including cigarettes, chewing tobacco, and e-cigarettes. If you need help quitting, ask your health care provider.  Wear loose-fitting clothing. Do not wear anything tight around your waist that causes pressure on your abdomen.  Raise (elevate) the head of your bed about 6 inches (15 cm).  Try to reduce your stress, such as with yoga or meditation. If you need help reducing stress, ask your health care provider.  If you are overweight, reduce your weight to an amount that is healthy for you. Ask your health care provider for guidance about a safe weight loss goal.  Keep all follow-up visits as told by your health care provider. This is important. Contact a health care provider if:  You have new symptoms.  You have unexplained weight loss.  You have difficulty swallowing, or it hurts to swallow.  You have wheezing or a persistent cough.  Your symptoms do not improve with treatment.  You have frequent heartburn for more than two weeks. Get help right away if:  You have pain in your arms, neck, jaw, teeth, or back.  You feel sweaty, dizzy, or light-headed.  You have chest pain or shortness of breath.  You vomit and your vomit looks like blood or coffee grounds.  Your stool is bloody or black. This information is not intended to replace advice given to you by your health care provider. Make sure you discuss any questions you have with your health care provider. Document Released: 05/28/2008 Document Revised: 06/17/2015 Document Reviewed: 05/06/2014 Elsevier Interactive Patient Education    2017 Elsevier Inc.   Peptic Ulcer A peptic ulcer is a painful sore in the lining of your esophagus, stomach, or the first part of your small intestine. You may have pain in the area between your chest and your belly button. The most common causes of an ulcer are:  An infection.  Using certain pain medicines too often or too much. Follow these instructions at home:  Avoid  alcohol.  Avoid caffeine.  Do not use any tobacco products. These include cigarettes, chewing tobacco, and e-cigarettes. If you need help quitting, ask your doctor.  Take over-the-counter and prescription medicines only as told by your doctor. Do not stop or change your medicines unless you talk with your doctor about it first.  Keep all follow-up visits as told by your doctor. This is important. Contact a doctor if:  You do not get better in 7 days after you start treatment.  You keep having an upset stomach (indigestion) or heartburn. Get help right away if:  You have sudden, sharp pain in your belly (abdomen).  You have lasting belly pain.  You have bloody poop (stool) or black, tarry poop.  You throw up (vomit) blood. It may look like coffee grounds.  You feel light-headed or feel like you may pass out (faint).  You get weak.  You get sweaty or feel sticky and cold to the touch (clammy). This information is not intended to replace advice given to you by your health care provider. Make sure you discuss any questions you have with your health care provider. Document Released: 04/05/2009 Document Revised: 05/26/2015 Document Reviewed: 10/10/2014 Elsevier Interactive Patient Education  2017 [employer].

## 2016-04-27 ENCOUNTER — Other Ambulatory Visit: Payer: Self-pay | Admitting: Internal Medicine

## 2016-04-27 ENCOUNTER — Encounter: Payer: Self-pay | Admitting: Gastroenterology

## 2016-04-27 LAB — AMYLASE: Amylase: 33 U/L (ref 0–105)

## 2016-04-27 LAB — HEPATITIS PANEL, ACUTE
HCV Ab: NEGATIVE
HEP B C IGM: NONREACTIVE
Hep A IgM: NONREACTIVE
Hepatitis B Surface Ag: NEGATIVE

## 2016-05-30 ENCOUNTER — Ambulatory Visit: Payer: BLUE CROSS/BLUE SHIELD | Admitting: Gastroenterology

## 2016-05-31 ENCOUNTER — Encounter: Payer: Self-pay | Admitting: Physician Assistant

## 2016-05-31 NOTE — Progress Notes (Signed)
Complete Physical  Assessment and Plan: Essential hypertension - continue medications, DASH diet, exercise and monitor at home. Call if greater than 130/80.  - CBC with Differential/Platelet - BASIC METABOLIC PANEL WITH GFR - Hepatic function panel - TSH - Urinalysis, Routine w reflex microscopic (not at [facility]) - Microalbumin / creatinine urine ratio - EKG 12-Lead  Prediabetes Discussed general issues about diabetes pathophysiology and management., Educational material distributed., Suggested low cholesterol diet., - Hemoglobin A1c - Insulin, fasting   Morbid obesity, unspecified obesity type (HCC) Obesity with co morbidities- long discussion about weight loss, diet, and exercise -     buPROPion (WELLBUTRIN XL) 150 MG 24 hr tablet; Take 1 tablet (150 mg total) by mouth every morning.    Hyperlipidemia -continue medications, check lipids, decrease fatty foods, increase activity.  - Lipid panel   GET MGM  Vitamin D deficiency - VITAMIN D 25 Hydroxy (Vit-D Deficiency, Fractures)  Encounter for long-term (current) use of medications - Magnesium  ANEMIA, MILD - Iron and TIBC - Ferritin - Vitamin B12  Nonintractable headache, unspecified chronicity pattern, unspecified headache type Continue lyrica   Nocturnal myoclonus Better with lyrica  Arthralgia of temporomandibular joint, unspecified laterality monitor   OSA (obstructive sleep apnea) Not on CPAP, continue weight loss   GERD Continue PPI/H2 blocker, diet discussed Follow up GI   Discussed med's effects and SE's. Screening labs and tests as requested with regular follow-up as recommended. Future Appointments Date Time Provider Department Center  06/06/2016 7:00 PM [facility] 1 [facility] [place]  08/01/2016 2:45 PM Napoleon Form, MD [facility]  06/05/2017 3:00 PM Quentin Mulling, PA-C [facility] None    HPI  46 y.o. female  presents for a complete physical.  Her blood pressure has been controlled  at home, today their BP is BP: 120/90 She does not workout due to chronic pain. She denies chest pain, shortness of breath, dizziness.  She had L4-L5 fusion with Dr. Yevette Edwards in Sept 2016.  Epigastric pain has been better with carafate/PPI, has seen GI and going to get EGD 06/06/2016 and MRCP 06/06/2016.  She is not on cholesterol medication and denies myalgias. Her cholesterol is at goal. The cholesterol last visit was:   Lab Results  Component Value Date   CHOL 187 12/09/2015   HDL 45 (L) 12/09/2015   LDLCALC 109 (H) 12/09/2015   LDLDIRECT 134.4 07/26/2011   TRIG 163 (H) 12/09/2015   CHOLHDL 4.2 12/09/2015   She has been working on diet and exercise for prediabetes, and denies polydipsia, polyuria and visual disturbances. Last A1C in the office was:  Lab Results  Component Value Date   HGBA1C 5.4 12/09/2015   Patient is on Vitamin D supplement, 5000 IU daily.   Lab Results  Component Value Date   VD25OH 25 (L) 01/20/2015     She is morbidly obese, had history of + OSA but unable to tolerate CPAP.  Body mass index is 49.06 kg/m.  Wt Readings from Last 3 Encounters:  06/01/16 294 lb 12.8 oz (133.7 kg)  06/01/16 294 lb 8 oz (133.6 kg)  04/26/16 291 lb (132 kg)    Current Medications:  Current Outpatient Prescriptions on File Prior to Visit  Medication Sig Dispense Refill  . ALPRAZolam (XANAX) 1 MG tablet Take 1 tablet (1 mg total) by mouth as directed. For MRI 2 tablet 0  . Cholecalciferol (VITAMIN D PO) Take 2,000 Units by mouth daily.     . fluticasone (FLONASE) 50 MCG/ACT nasal spray Place  2 sprays into both nostrils at bedtime. 16 g 3  . levocetirizine (XYZAL) 5 MG tablet Take 1 tablet (5 mg total) by mouth daily. 30 tablet 3  . methocarbamol (ROBAXIN) 500 MG tablet TAKE 1 TABLET (500 MG TOTAL) BY MOUTH 3 (THREE) TIMES DAILY AS NEEDED FOR MUSCLE SPASMS. 90 tablet 1  . Multiple Vitamin (MULTIVITAMIN) capsule Take 1 capsule by mouth daily.    . ondansetron (ZOFRAN) 4 MG  tablet Take 1 tablet (4 mg total) by mouth daily as needed for nausea or vomiting (not sedatin). 30 tablet 1  . pantoprazole (PROTONIX) 40 MG tablet TAKE ONE TABLET BY MOUTH DAILY 90 tablet 1  . pregabalin (LYRICA) 150 MG capsule TAKE 1 CAPSULE BY MOUTH 3 TIMES DAILY 270 capsule 3  . promethazine (PHENERGAN) 25 MG tablet Take 1 tablet (25 mg total) by mouth every 6 (six) hours as needed for nausea or vomiting. sedating 60 tablet 3  . sucralfate (CARAFATE) 1 g tablet Take 1 tablet (1 g total) by mouth 4 (four) times daily. 120 tablet 1   No current facility-administered medications on file prior to visit.    Health Maintenance:   Immunization History  Administered Date(s) Administered  . Influenza Split 11/13/2011, 11/13/2013  . Influenza Whole 11/25/2008  . Influenza, Seasonal, Injecte, Preservative Fre 12/09/2015  . Td 01/23/1990  . Tdap 08/21/2012   Tetanus: 2014 Pneumovax:  N/A Prevnar 13: N/A Flu vaccine: 2017 Zostavax: N/A  LMP: 1 week ago, not regular Pap: 07/2012 due 2019  MGM: 08/2012  DUE DEXA: N/A Colonoscopy: N/a EGD: N/A Last Eye Exam: Dr. Shea Evans Has seen Dr. Juanetta Gosling  Patient Care Team: Lucky Cowboy, MD as PCP - General (Internal Medicine) Estill Bamberg, MD as Consulting Physician (Orthopedic Surgery) York Spaniel, MD as Consulting Physician (Neurology)  Medical History:  Past Medical History:  Diagnosis Date  . Allergic rhinitis   . Anemia   . Arthritis   . Chronic low back pain   . GERD (gastroesophageal reflux disease)   . Headache    migraine  . Hypertension   . 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 Obesity   . OSA on CPAP    does not use CPAP   Allergies No Known Allergies  SURGICAL HISTORY She  has a past surgical history that includes Cholecystectomy (Feb. 2011) and Anterior lat lumbar fusion (N/A, 10/22/2014). FAMILY HISTORY Her family history includes Arthritis in her father and other; Barrett's esophagus in her father; Cancer in her  mother; Fibromyalgia in her sister; Heart disease in her other; Hypertension in her father and other; Lung disease in her mother; Melanoma in her sister; Mental illness in her other; Stroke in her other. SOCIAL HISTORY She  reports that she has never smoked. She has never used smokeless tobacco. She reports that she drinks alcohol. She reports that she does not use drugs.   Review of Systems:  Review of Systems  Constitutional: Negative.   HENT: Negative for congestion, ear discharge, ear pain, hearing loss, nosebleeds, sore throat and tinnitus.   Eyes: Negative.   Respiratory: Negative.  Negative for stridor.   Cardiovascular: Negative.   Gastrointestinal: Positive for abdominal pain, constipation, diarrhea, heartburn and nausea.  Genitourinary: Negative.   Musculoskeletal: Positive for back pain. Negative for falls, joint pain, myalgias and neck pain.  Skin: Negative.   Neurological: Headaches:  TMJ.  Endo/Heme/Allergies: Negative.   Psychiatric/Behavioral: Negative.      Physical Exam: Estimated body mass index is 49.06 kg/m as calculated from  the following:   Height as of this encounter: 5\' 5"  (1.651 m).   Weight as of this encounter: 294 lb 12.8 oz (133.7 kg). BP 120/90   Pulse 86   Temp 97.7 F (36.5 C)   Resp 16   Ht 5\' 5"  (1.651 m)   Wt 294 lb 12.8 oz (133.7 kg)   LMP 05/02/2016   SpO2 97%   BMI 49.06 kg/m  General Appearance: Well nourished, in no apparent distress.  Eyes: PERRLA, EOMs, conjunctiva no swelling or erythema, normal fundi and vessels.  Sinuses: No Frontal/maxillary tenderness  ENT/Mouth: Ext aud canals clear, normal light reflex with TMs without erythema, bulging. Good dentition. No erythema, swelling, or exudate on post pharynx. Tonsils not swollen or erythematous. Hearing normal.  Neck: Supple, thyroid normal. No bruits  Respiratory: Respiratory effort normal, BS equal bilaterally without rales, rhonchi, wheezing or stridor.  Cardio: RRR without  murmurs, rubs or gallops. Brisk peripheral pulses with 1+edema.  Chest: symmetric, with normal excursions and percussion.  Breasts: Symmetric, without lumps, nipple discharge, retractions.  Abdomen: Soft, + RUQ pain, well healing lateral right AB scar, obese no guarding, rebound, hernias, masses, or organomegaly. .  Lymphatics: Non tender without lymphadenopathy.  Genitourinary: defer Musculoskeletal: Full ROM all peripheral extremities,5/5 strength, pain with change in position. Skin: Warm, dry without rashes, lesions, ecchymosis. Neuro: Cranial nerves intact, reflexes equal bilaterally. Normal muscle tone, no cerebellar symptoms. Sensation intact.  Psych: Awake and oriented X 3, normal affect, Insight and Judgment appropriate.   EKG: WNL no changes.   Quentin MullingAmanda Collier 3:31 PM [facility]

## 2016-06-01 ENCOUNTER — Ambulatory Visit (INDEPENDENT_AMBULATORY_CARE_PROVIDER_SITE_OTHER): Payer: BLUE CROSS/BLUE SHIELD | Admitting: Gastroenterology

## 2016-06-01 ENCOUNTER — Encounter: Payer: Self-pay | Admitting: Physician Assistant

## 2016-06-01 ENCOUNTER — Ambulatory Visit (INDEPENDENT_AMBULATORY_CARE_PROVIDER_SITE_OTHER): Payer: BLUE CROSS/BLUE SHIELD | Admitting: Physician Assistant

## 2016-06-01 ENCOUNTER — Encounter: Payer: Self-pay | Admitting: Gastroenterology

## 2016-06-01 VITALS — BP 120/90 | HR 86 | Temp 97.7°F | Resp 16 | Ht 65.0 in | Wt 294.8 lb

## 2016-06-01 VITALS — BP 104/74 | HR 88 | Ht 60.0 in | Wt 294.5 lb

## 2016-06-01 DIAGNOSIS — R1011 Right upper quadrant pain: Secondary | ICD-10-CM | POA: Diagnosis not present

## 2016-06-01 DIAGNOSIS — E782 Mixed hyperlipidemia: Secondary | ICD-10-CM

## 2016-06-01 DIAGNOSIS — J302 Other seasonal allergic rhinitis: Secondary | ICD-10-CM

## 2016-06-01 DIAGNOSIS — Z79899 Other long term (current) drug therapy: Secondary | ICD-10-CM | POA: Diagnosis not present

## 2016-06-01 DIAGNOSIS — R6889 Other general symptoms and signs: Secondary | ICD-10-CM | POA: Diagnosis not present

## 2016-06-01 DIAGNOSIS — Z0001 Encounter for general adult medical examination with abnormal findings: Secondary | ICD-10-CM

## 2016-06-01 DIAGNOSIS — Z6841 Body Mass Index (BMI) 40.0 and over, adult: Secondary | ICD-10-CM

## 2016-06-01 DIAGNOSIS — I10 Essential (primary) hypertension: Secondary | ICD-10-CM

## 2016-06-01 DIAGNOSIS — R7303 Prediabetes: Secondary | ICD-10-CM

## 2016-06-01 DIAGNOSIS — R519 Headache, unspecified: Secondary | ICD-10-CM

## 2016-06-01 DIAGNOSIS — D649 Anemia, unspecified: Secondary | ICD-10-CM

## 2016-06-01 DIAGNOSIS — G4769 Other sleep related movement disorders: Secondary | ICD-10-CM

## 2016-06-01 DIAGNOSIS — E559 Vitamin D deficiency, unspecified: Secondary | ICD-10-CM

## 2016-06-01 DIAGNOSIS — K219 Gastro-esophageal reflux disease without esophagitis: Secondary | ICD-10-CM | POA: Diagnosis not present

## 2016-06-01 DIAGNOSIS — G4733 Obstructive sleep apnea (adult) (pediatric): Secondary | ICD-10-CM

## 2016-06-01 DIAGNOSIS — Z136 Encounter for screening for cardiovascular disorders: Secondary | ICD-10-CM

## 2016-06-01 DIAGNOSIS — R51 Headache: Secondary | ICD-10-CM

## 2016-06-01 DIAGNOSIS — R1013 Epigastric pain: Secondary | ICD-10-CM

## 2016-06-01 DIAGNOSIS — K2100 Gastro-esophageal reflux disease with esophagitis, without bleeding: Secondary | ICD-10-CM

## 2016-06-01 DIAGNOSIS — R110 Nausea: Secondary | ICD-10-CM | POA: Diagnosis not present

## 2016-06-01 DIAGNOSIS — M26629 Arthralgia of temporomandibular joint, unspecified side: Secondary | ICD-10-CM

## 2016-06-01 DIAGNOSIS — M5410 Radiculopathy, site unspecified: Secondary | ICD-10-CM

## 2016-06-01 LAB — CBC WITH DIFFERENTIAL/PLATELET
BASOS ABS: 0 {cells}/uL (ref 0–200)
Basophils Relative: 0 %
EOS ABS: 270 {cells}/uL (ref 15–500)
Eosinophils Relative: 2 %
HEMATOCRIT: 37.2 % (ref 35.0–45.0)
HEMOGLOBIN: 12.1 g/dL (ref 11.7–15.5)
Lymphocytes Relative: 22 %
Lymphs Abs: 2970 cells/uL (ref 850–3900)
MCH: 26.8 pg — AB (ref 27.0–33.0)
MCHC: 32.5 g/dL (ref 32.0–36.0)
MCV: 82.5 fL (ref 80.0–100.0)
MONOS PCT: 8 %
MPV: 9.5 fL (ref 7.5–12.5)
Monocytes Absolute: 1080 cells/uL — ABNORMAL HIGH (ref 200–950)
Neutro Abs: 9180 cells/uL — ABNORMAL HIGH (ref 1500–7800)
Neutrophils Relative %: 68 %
PLATELETS: 428 10*3/uL — AB (ref 140–400)
RBC: 4.51 MIL/uL (ref 3.80–5.10)
RDW: 14.4 % (ref 11.0–15.0)
WBC: 13.5 10*3/uL — ABNORMAL HIGH (ref 3.8–10.8)

## 2016-06-01 LAB — VITAMIN B12: Vitamin B-12: 451 pg/mL (ref 200–1100)

## 2016-06-01 LAB — TSH: TSH: 3.08 m[IU]/L

## 2016-06-01 LAB — FERRITIN: Ferritin: 77 ng/mL (ref 10–232)

## 2016-06-01 MED ORDER — ALPRAZOLAM 1 MG PO TABS: 1.0000 mg | ORAL_TABLET | ORAL | 0 refills | Status: DC

## 2016-06-01 MED ORDER — BUPROPION HCL ER (XL) 150 MG PO TB24: 150.0000 mg | ORAL_TABLET | ORAL | 2 refills | Status: DC

## 2016-06-01 NOTE — Progress Notes (Signed)
Emily Gay    [id number]    05/09/1970  Primary Care Physician:McKeown, Chrissie NoaWilliam, MD  Referring Physician: Quentin Mullingollier, Amanda, PA-C [address] [place], [place] [address]  Chief complaint:  Right upper quadrant abdominal pain  HPI: 46 year old female here for new patient visit with complaints of intermittent right upper quadrant abdominal pain since she had gallbladder surgery for chronic cholecystitis in 2011. She gets intermittent episodes of epigastric pain radiating to right upper quadrant with waxing and waning pain. Sometimes associated with nausea, abdominal cramps and diarrhea. She did notice some improvement since she was started on Protonix by her primary care physician Dr. Oneta RackMcKeown. She had abdominal ultrasound March 2017 which did not show any CBD obstruction and gallbladder fossa appeared normal. Patient never had EGD. Denies any dysphagia, odynophagia, melena or blood per rectum. Other medical history include lumbar spinal fusion surgery in 2016   Outpatient Encounter Prescriptions as of 06/01/2016  Medication Sig  . Cholecalciferol (VITAMIN D PO) Take 2,000 Units by mouth daily.   . fluticasone (FLONASE) 50 MCG/ACT nasal spray Place 2 sprays into both nostrils at bedtime.  Marland Kitchen. levocetirizine (XYZAL) 5 MG tablet Take 1 tablet (5 mg total) by mouth daily.  . methocarbamol (ROBAXIN) 500 MG tablet TAKE 1 TABLET (500 MG TOTAL) BY MOUTH 3 (THREE) TIMES DAILY AS NEEDED FOR MUSCLE SPASMS.  . Multiple Vitamin (MULTIVITAMIN) capsule Take 1 capsule by mouth daily.  . ondansetron (ZOFRAN) 4 MG tablet Take 1 tablet (4 mg total) by mouth daily as needed for nausea or vomiting (not sedatin).  . pantoprazole (PROTONIX) 40 MG tablet TAKE ONE TABLET BY MOUTH DAILY  . pregabalin (LYRICA) 150 MG capsule TAKE 1 CAPSULE BY MOUTH 3 TIMES DAILY  . promethazine (PHENERGAN) 25 MG tablet Take 1 tablet (25 mg total) by mouth every 6 (six) hours as needed for nausea or  vomiting. sedating  . sucralfate (CARAFATE) 1 g tablet Take 1 tablet (1 g total) by mouth 4 (four) times daily.   No facility-administered encounter medications on file as of 06/01/2016.      Allergies as of 06/01/2016  . (No Known Allergies)    Past Medical History:  Diagnosis Date  . Allergic rhinitis   . Anemia   . Arthritis   . Chronic low back pain   . GERD (gastroesophageal reflux disease)   . Headache    migraine  . Hypertension   . 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 Obesity   . OSA on CPAP    does not use CPAP    Past Surgical History:  Procedure Laterality Date  . ANTERIOR LAT LUMBAR FUSION N/A 10/22/2014   Procedure: ANTERIOR LATERAL LUMBAR FUSION 1 LEVEL;  Surgeon: Estill BambergMark Dumonski, MD;  Location: [facility] OR;  Service: Orthopedics;  Laterality: N/A;  . CHOLECYSTECTOMY  Feb. 2011   Gall Bladder    Family History  Problem Relation Age of Onset  . Cancer Mother        lyphoma  . Lung disease Mother   . Arthritis Father   . Hypertension Father   . Barrett's esophagus Father   . Melanoma Sister   . Fibromyalgia Sister   . Mental illness Other        emotional illness (other blood relative)  . Stroke Other        Stroke (parent and grandparent)  . Heart disease Other        (grandparent)  . Hypertension Other        (  other blood relative)  . Arthritis Other        Parent and grandparent  . Colon cancer Neg Hx   . Rectal cancer Neg Hx   . Stomach cancer Neg Hx   . Esophageal cancer Neg Hx   . Liver cancer Neg Hx     Social History   Social History  . Marital status: Married    Spouse name: N/A  . Number of children: 0  . Years of education: college   Occupational History  . Manager [employer]   Social History Main Topics  . Smoking status: Never Smoker  . Smokeless tobacco: Never Used  . Alcohol use Yes     Comment: occasionally  . Drug use: No  . Sexual activity: Not on file   Other Topics Concern  . 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      Review of systems: Review of Systems  Constitutional: Negative for fever and chills.  HENT: Negative.   Eyes: Negative for blurred vision.  Respiratory: Negative for cough, shortness of breath and wheezing.   Cardiovascular: Negative for chest pain and palpitations.  Gastrointestinal: as per HPI Genitourinary: Negative for dysuria, urgency, frequency and hematuria.  Musculoskeletal: Negative for myalgias, back pain and joint pain.  Skin: Negative for itching and rash.  Neurological: Negative for dizziness, tremors, focal weakness, seizures and loss of consciousness.  Endo/Heme/Allergies: Positive for seasonal allergies.  Psychiatric/Behavioral: Negative for depression, suicidal ideas and hallucinations.  All other systems reviewed and are negative.   Physical Exam: Vitals:   06/01/16 0904  BP: 104/74  Pulse: 88   Body mass index is 57.52 kg/m. Gen:      No acute distress HEENT:  EOMI, sclera anicteric Neck:     No masses; no thyromegaly Lungs:    Clear to auscultation bilaterally; normal respiratory effort CV:         Regular rate and rhythm; no murmurs Abd:      + bowel sounds; soft, non-tender; no palpable masses, no distension Ext:    No edema; adequate peripheral perfusion Skin:      Warm and dry; no rash Neuro: alert and oriented x 3 Psych: normal mood and affect  Data Reviewed:  Reviewed labs, radiology imaging, old records and pertinent past GI work up   Assessment and Plan/Recommendations:  46 year old female here with complaints of right upper quadrant pain and epigastric abdominal pain status post gallbladder surgery for chronic cholecystitis in 2011 with progressive worsening of symptoms Patient reports her symptoms are similar to what she had prior to  gallbladder surgery with cholecystitis Abdominal ultrasound did not show any retained CBD stone We'll check CBC, CMP Schedule MRCP to exclude CBD sludge or retained stone Patient has mild claustrophobia, prescription for 1 mg Xanax 2 tabs to take prior to the MRI  Epigastric abdominal pain somewhat better since she started PPI Schedule for EGD to exclude peptic ulcer disease, gastritis, esophagitis Continue Protonix 40 mg daily and antireflux measures The risks and benefits as well as alternatives of endoscopic procedure(s) have been discussed and reviewed. All questions answered. The patient agrees to proceed.     Iona Beard , MD [phone] Mon-Fri 8a-5p [phone] after 5p, weekends, holidays  CC: Quentin Mulling, [place]

## 2016-06-01 NOTE — Patient Instructions (Addendum)
[facility]  7 a.m.-6:30 p.m., Monday 7 a.m.-5 p.m., Tuesday-Friday Schedule an appointment by calling [phone].  Encourage you to get the 3D Mammogram  The 3D Mammogram is much more specific and sensitive to pick up breast cancer. For women with fibrocystic breast or lumpy breast it can be hard to determine if it is cancer or not but the 3D mammogram is able to tell this difference which cuts back on unneeded additional tests or scary call backs.   - over 40% increase in detection of breast cancer - over 40% reduction in false positives.  - fewer call backs - reduced anxiety - improved outcomes - [facility]   Can call Dr. Toni ArthursFuller to get evaluated for dental sleep appliance. # [phone]  OR you can try Dr[facility] Reatha ArmourJoel Gentry [facility] # [phone]  We will send notes. Call and get price quote on both.      Simple math prevails.    1st - exercise does not produce significant weight loss - at best one converts fat into muscle , "bulks up", loses inches, but usually stays "weight neutral"     2nd - think of your body weightas a check book: If you eat more calories than you burn up - you save money or gain weight .... Or if you spend more money than you put in the check book, ie burn up more calories than you eat, then you lose weight     3rd - if you walk or run 1 mile, you burn up 100 calories - you have to burn up 3,500 calories to lose 1 pound, ie you have to walk/run 35 miles to lose 1 measly pound. So if you want to lose 10 #, then you have to walk/run 350 miles, so.... clearly exercise is not the solution.     4. So if you consume 1,500 calories, then you have to burn up the equivalent of 15 miles to stay weight neutral - It also stands to reason that if you consume 1,500 cal/day and don't lose weight, then you must be burning up about 1,500 cals/day to stay weight neutral.     5. If you really want to lose weight, you must cut your  calorie intake 300 calories /day and at that rate you should lose about 1 # every 3 days.   6. Please purchase Dr Francis DowseJoel Fuhrman's book(s) "The End of Dieting" & "Eat to Live" . It has some great concepts and recipes.

## 2016-06-01 NOTE — Patient Instructions (Signed)
You have been scheduled for an Endoscopy on 06/09/2016 at 10:30 am, separate instructions have been given  [facility]  . Your appointment time is 7pm. Please arrive 15 minutes prior to your appointment time for registration purposes. Please make certain not to have anything to eat or drink 6 hours prior to your test. In addition, if you have any metal in your body, have a pacemaker or defibrillator, please be sure to let your ordering physician know. This test typically takes 45 minutes to 1 hour to complete. Should you need to reschedule, please call [phone] to do so.  You will need driver for MRI since taking Xanax for test  We have given you a printed prescription of Xanax to take before your MRI  You will take 1- 2 hours prior to your MRI and 1- right before your MRI for claustrophobia

## 2016-06-02 LAB — HEPATIC FUNCTION PANEL
ALBUMIN: 4.1 g/dL (ref 3.6–5.1)
ALT: 9 U/L (ref 6–29)
AST: 13 U/L (ref 10–30)
Alkaline Phosphatase: 68 U/L (ref 33–115)
Bilirubin, Direct: 0.1 mg/dL (ref <=0.2)
Indirect Bilirubin: 0.1 mg/dL — ABNORMAL LOW (ref 0.2–1.2)
TOTAL PROTEIN: 7 g/dL (ref 6.1–8.1)
Total Bilirubin: 0.2 mg/dL (ref 0.2–1.2)

## 2016-06-02 LAB — MAGNESIUM: MAGNESIUM: 1.9 mg/dL (ref 1.5–2.5)

## 2016-06-02 LAB — LIPID PANEL
Cholesterol: 168 mg/dL (ref <200)
HDL: 46 mg/dL — ABNORMAL LOW (ref >50)
LDL Cholesterol: 95 mg/dL (ref <100)
Total CHOL/HDL Ratio: 3.7 Ratio (ref <5.0)
Triglycerides: 133 mg/dL (ref <150)
VLDL: 27 mg/dL (ref <30)

## 2016-06-02 LAB — BASIC METABOLIC PANEL WITH GFR
BUN: 12 mg/dL (ref 7–25)
CHLORIDE: 103 mmol/L (ref 98–110)
CO2: 21 mmol/L (ref 20–31)
CREATININE: 0.62 mg/dL (ref 0.50–1.10)
Calcium: 9.4 mg/dL (ref 8.6–10.2)
GFR, Est African American: >89 mL/min (ref >=60)
GFR, Est Non African American: >89 mL/min (ref >=60)
GLUCOSE: 75 mg/dL (ref 65–99)
Potassium: 4.3 mmol/L (ref 3.5–5.3)
Sodium: 138 mmol/L (ref 135–146)

## 2016-06-02 LAB — MICROALBUMIN / CREATININE URINE RATIO
Creatinine, Urine: 51 mg/dL (ref 20–320)
MICROALB UR: 0.2 mg/dL
Microalb Creat Ratio: 4 mcg/mg creat (ref <30)

## 2016-06-02 LAB — IRON AND TIBC
%SAT: 14 % (ref 11–50)
Iron: 47 ug/dL (ref 40–190)
TIBC: 326 ug/dL (ref 250–450)
UIBC: 279 ug/dL (ref 125–400)

## 2016-06-02 LAB — URINALYSIS, ROUTINE W REFLEX MICROSCOPIC
BILIRUBIN URINE: NEGATIVE
Glucose, UA: NEGATIVE
HGB URINE DIPSTICK: NEGATIVE
KETONES UR: NEGATIVE
Leukocytes, UA: NEGATIVE
NITRITE: NEGATIVE
PH: 6 (ref 5.0–8.0)
Protein, ur: NEGATIVE
Specific Gravity, Urine: 1.011 (ref 1.001–1.035)

## 2016-06-02 LAB — HEMOGLOBIN A1C
HEMOGLOBIN A1C: 5.6 % (ref <5.7)
MEAN PLASMA GLUCOSE: 114 mg/dL

## 2016-06-06 ENCOUNTER — Ambulatory Visit (HOSPITAL_COMMUNITY)
Admission: RE | Admit: 2016-06-06 | Discharge: 2016-06-06 | Disposition: A | Discharge status: Home / Self Care | Payer: BLUE CROSS/BLUE SHIELD | Source: Ambulatory Visit | Attending: Gastroenterology | Admitting: Gastroenterology

## 2016-06-06 DIAGNOSIS — K760 Fatty (change of) liver, not elsewhere classified: Secondary | ICD-10-CM | POA: Diagnosis not present

## 2016-06-06 DIAGNOSIS — K219 Gastro-esophageal reflux disease without esophagitis: Secondary | ICD-10-CM | POA: Insufficient documentation

## 2016-06-06 DIAGNOSIS — R1013 Epigastric pain: Secondary | ICD-10-CM | POA: Diagnosis present

## 2016-06-06 DIAGNOSIS — Z6841 Body Mass Index (BMI) 40.0 and over, adult: Secondary | ICD-10-CM

## 2016-06-06 DIAGNOSIS — R1011 Right upper quadrant pain: Secondary | ICD-10-CM | POA: Diagnosis present

## 2016-06-06 DIAGNOSIS — R110 Nausea: Secondary | ICD-10-CM | POA: Diagnosis present

## 2016-06-06 MED ORDER — GADOBENATE DIMEGLUMINE 529 MG/ML IV SOLN
20.0000 mL | Freq: Once | INTRAVENOUS | Status: AC | PRN
  Administered 2016-06-06: 20 mL via INTRAVENOUS

## 2016-06-07 ENCOUNTER — Other Ambulatory Visit: Payer: Self-pay | Admitting: Gastroenterology

## 2016-06-07 ENCOUNTER — Other Ambulatory Visit: Payer: Self-pay | Admitting: Physician Assistant

## 2016-06-07 DIAGNOSIS — R110 Nausea: Secondary | ICD-10-CM

## 2016-06-07 DIAGNOSIS — R1011 Right upper quadrant pain: Secondary | ICD-10-CM

## 2016-06-07 DIAGNOSIS — Z6841 Body Mass Index (BMI) 40.0 and over, adult: Secondary | ICD-10-CM

## 2016-06-07 DIAGNOSIS — R1013 Epigastric pain: Secondary | ICD-10-CM

## 2016-06-07 DIAGNOSIS — K219 Gastro-esophageal reflux disease without esophagitis: Secondary | ICD-10-CM

## 2016-06-08 NOTE — Telephone Encounter (Signed)
Lyrica was called into pharmacy @ 9:11am on 17th May 2018 by DD

## 2016-06-09 ENCOUNTER — Ambulatory Visit (HOSPITAL_COMMUNITY): Payer: BLUE CROSS/BLUE SHIELD | Admitting: Anesthesiology

## 2016-06-09 ENCOUNTER — Encounter (HOSPITAL_COMMUNITY): Payer: Self-pay | Admitting: Gastroenterology

## 2016-06-09 ENCOUNTER — Ambulatory Visit (HOSPITAL_COMMUNITY)
Admission: RE | Admit: 2016-06-09 | Discharge: 2016-06-09 | Disposition: A | Discharge status: Home / Self Care | Payer: BLUE CROSS/BLUE SHIELD | Source: Ambulatory Visit | Attending: Gastroenterology | Admitting: Gastroenterology

## 2016-06-09 ENCOUNTER — Encounter (HOSPITAL_COMMUNITY)
Admission: RE | Disposition: A | Discharge status: Home / Self Care | Payer: Self-pay | Source: Ambulatory Visit | Attending: Gastroenterology

## 2016-06-09 DIAGNOSIS — K2970 Gastritis, unspecified, without bleeding: Secondary | ICD-10-CM | POA: Insufficient documentation

## 2016-06-09 DIAGNOSIS — E669 Obesity, unspecified: Secondary | ICD-10-CM | POA: Insufficient documentation

## 2016-06-09 DIAGNOSIS — Z6841 Body Mass Index (BMI) 40.0 and over, adult: Secondary | ICD-10-CM | POA: Diagnosis not present

## 2016-06-09 DIAGNOSIS — R1011 Right upper quadrant pain: Secondary | ICD-10-CM | POA: Diagnosis not present

## 2016-06-09 DIAGNOSIS — G8929 Other chronic pain: Secondary | ICD-10-CM | POA: Diagnosis not present

## 2016-06-09 DIAGNOSIS — Z79899 Other long term (current) drug therapy: Secondary | ICD-10-CM | POA: Insufficient documentation

## 2016-06-09 DIAGNOSIS — M545 Low back pain: Secondary | ICD-10-CM | POA: Insufficient documentation

## 2016-06-09 DIAGNOSIS — K449 Diaphragmatic hernia without obstruction or gangrene: Secondary | ICD-10-CM | POA: Insufficient documentation

## 2016-06-09 DIAGNOSIS — F40240 Claustrophobia: Secondary | ICD-10-CM | POA: Insufficient documentation

## 2016-06-09 DIAGNOSIS — K219 Gastro-esophageal reflux disease without esophagitis: Secondary | ICD-10-CM | POA: Diagnosis not present

## 2016-06-09 DIAGNOSIS — R1013 Epigastric pain: Secondary | ICD-10-CM

## 2016-06-09 DIAGNOSIS — I10 Essential (primary) hypertension: Secondary | ICD-10-CM | POA: Diagnosis not present

## 2016-06-09 DIAGNOSIS — Z7952 Long term (current) use of systemic steroids: Secondary | ICD-10-CM | POA: Diagnosis not present

## 2016-06-09 DIAGNOSIS — R110 Nausea: Secondary | ICD-10-CM

## 2016-06-09 HISTORY — PX: ESOPHAGOGASTRODUODENOSCOPY (EGD) WITH PROPOFOL: SHX5813

## 2016-06-09 SURGERY — ESOPHAGOGASTRODUODENOSCOPY (EGD) WITH PROPOFOL
Anesthesia: Monitor Anesthesia Care

## 2016-06-09 MED ORDER — PROPOFOL 10 MG/ML IV BOLUS
INTRAVENOUS | Status: DC | PRN
  Administered 2016-06-09 (×3): 20 mg via INTRAVENOUS

## 2016-06-09 MED ORDER — LIDOCAINE 2% (20 MG/ML) 5 ML SYRINGE
INTRAMUSCULAR | Status: DC | PRN
  Administered 2016-06-09 (×2): 100 mg via INTRAVENOUS

## 2016-06-09 MED ORDER — LIDOCAINE 2% (20 MG/ML) 5 ML SYRINGE
INTRAMUSCULAR | Status: AC
  Filled 2016-06-09: qty 5

## 2016-06-09 MED ORDER — PROPOFOL 500 MG/50ML IV EMUL
INTRAVENOUS | Status: DC | PRN
  Administered 2016-06-09: 140 ug/kg/min via INTRAVENOUS

## 2016-06-09 MED ORDER — SODIUM CHLORIDE 0.9 % IV SOLN: INTRAVENOUS | Status: DC

## 2016-06-09 MED ORDER — PROPOFOL 10 MG/ML IV BOLUS
INTRAVENOUS | Status: AC
  Filled 2016-06-09: qty 60

## 2016-06-09 MED ORDER — LACTATED RINGERS IV SOLN
INTRAVENOUS | Status: DC
  Administered 2016-06-09: 11:00:00 via INTRAVENOUS

## 2016-06-09 SURGICAL SUPPLY — 14 items

## 2016-06-09 NOTE — Op Note (Signed)
[facility] Patient Name: Emily Gay Procedure Date: 06/09/2016 MRN: [id number] Attending MD: Napoleon Form , MD Date of Birth: 13-Dec-1970 CSN: [id number] Age: 46 Admit Type: Outpatient Procedure:                Upper GI endoscopy Indications:              Upper abdominal symptoms that persist despite an                            appropriate trial of therapy, Epigastric abdominal                            pain, Abdominal pain in the right upper quadrant Providers:                Napoleon Form, MD, Jacquiline Doe, RN, Judithann Sauger, Technician, Leroy Libman, CRNA Referring MD:              Medicines:                Monitored Anesthesia Care Complications:            No immediate complications. Estimated Blood Loss:     Estimated blood loss was minimal. Procedure:                Pre-Anesthesia Assessment:                           - Prior to the procedure, a History and Physical                            was performed, and patient medications and                            allergies were reviewed. The patient's tolerance of                            previous anesthesia was also reviewed. The risks                            and benefits of the procedure and the sedation                            options and risks were discussed with the patient.                            All questions were answered, and informed consent                            was obtained. Prior Anticoagulants: The patient has                            taken no previous anticoagulant or antiplatelet  agents. ASA Grade Assessment: II - A patient with                            mild systemic disease. After reviewing the risks                            and benefits, the patient was deemed in                            satisfactory condition to undergo the procedure.                           After obtaining informed consent, the  endoscope was                            passed under direct vision. Throughout the                            procedure, the patient's blood pressure, pulse, and                            oxygen saturations were monitored continuously. The                            Endoscope was introduced through the mouth, and                            advanced to the second part of duodenum. The upper                            GI endoscopy was accomplished without difficulty.                            The patient tolerated the procedure well. Scope In: Scope Out: Findings:      The esophagus was normal.      Scattered mild inflammation characterized by congestion (edema),       erythema and mucus was found in the gastric body and in the gastric       antrum. Biopsies were taken with a cold forceps for Helicobacter pylori       testing.      A small hiatal hernia was present.      The examined duodenum was normal. Impression:               - Normal esophagus.                           - Gastritis. Biopsied.                           - Small hiatal hernia.                           - Normal examined duodenum. Moderate Sedation:      N/A- Per Anesthesia Care Recommendation:           -  Patient has a contact number available for                            emergencies. The signs and symptoms of potential                            delayed complications were discussed with the                            patient. Return to normal activities tomorrow.                            Written discharge instructions were provided to the                            patient.                           - Resume previous diet.                           - Continue present medications.                           - Await pathology results.                           - No aspirin, ibuprofen, naproxen, or other                            non-steroidal anti-inflammatory drugs.                           - Follow an  antireflux regimen. Procedure Code(s):        --- Professional ---                           [phone], Esophagogastroduodenoscopy, flexible,                            transoral; with biopsy, single or multiple Diagnosis Code(s):        --- Professional ---                           K29.70, Gastritis, unspecified, without bleeding                           K44.9, Diaphragmatic hernia without obstruction or                            gangrene                           R19.8, Other specified symptoms and signs involving                            the digestive system and abdomen  R10.13, Epigastric pain                           R10.11, Right upper quadrant pain CPT copyright 2016 American Medical Association. All rights reserved. The codes documented in this report are preliminary and upon coder review may  be revised to meet current compliance requirements. Napoleon Form, MD 06/09/2016 11:12:33 AM This report has been signed electronically. Number of Addenda: 0

## 2016-06-09 NOTE — Interval H&P Note (Signed)
History and Physical Interval Note:  06/09/2016 9:49 AM  Emily Gay  has presented today for surgery, with the diagnosis of RUQ Pain  Epigastric pain BMI>50  Nausea and gerd  The various methods of treatment have been discussed with the patient and family. After consideration of risks, benefits and other options for treatment, the patient has consented to  Procedure(s): ESOPHAGOGASTRODUODENOSCOPY (EGD) WITH PROPOFOL (N/A) as a surgical intervention .  The patient's history has been reviewed, patient examined, no change in status, stable for surgery.  I have reviewed the patient's chart and labs.  Questions were answered to the patient's satisfaction.     Reia Viernes

## 2016-06-09 NOTE — Transfer of Care (Signed)
Immediate Anesthesia Transfer of Care Note  Patient: Emily Gay  Procedure(s) Performed: Procedure(s): ESOPHAGOGASTRODUODENOSCOPY (EGD) WITH PROPOFOL (N/A)  Patient Location: Endoscopy Unit  Anesthesia Type:MAC  Level of Consciousness: awake, alert  and oriented  Airway & Oxygen Therapy: Patient Spontanous Breathing and Patient connected to nasal cannula oxygen  Post-op Assessment: Report given to RN and Post -op Vital signs reviewed and stable  Post vital signs: Reviewed and stable  Last Vitals:  Vitals:   06/09/16 1020  BP: (!) 121/56  Pulse: 76  Resp: 11  Temp: 36.6 C    Last Pain:  Vitals:   06/09/16 1020  TempSrc: Oral         Complications: No apparent anesthesia complications

## 2016-06-09 NOTE — Discharge Instructions (Signed)
YOU HAD AN ENDOSCOPIC PROCEDURE TODAY: Refer to the procedure report and other information in the discharge instructions given to you for any specific questions about what was found during the examination. If this information does not answer your questions, please call [place] office at [phone] to clarify.   YOU SHOULD EXPECT: Some feelings of bloating in the abdomen. Passage of more gas than usual. Walking can help get rid of the air that was put into your GI tract during the procedure and reduce the bloating. If you had a lower endoscopy (such as a colonoscopy or flexible sigmoidoscopy) you may notice spotting of blood in your stool or on the toilet paper. Some abdominal soreness may be present for a day or two, also.  DIET: Your first meal following the procedure should be a light meal and then it is ok to progress to your normal diet. A half-sandwich or bowl of soup is an example of a good first meal. Heavy or fried foods are harder to digest and may make you feel nauseous or bloated. Drink plenty of fluids but you should avoid alcoholic beverages for 24 hours. If you had a esophageal dilation, please see attached instructions for diet.    ACTIVITY: Your care partner should take you home directly after the procedure. You should plan to take it easy, moving slowly for the rest of the day. You can resume normal activity the day after the procedure however YOU SHOULD NOT DRIVE, use power tools, machinery or perform tasks that involve climbing or major physical exertion for 24 hours (because of the sedation medicines used during the test).   SYMPTOMS TO REPORT IMMEDIATELY: A gastroenterologist can be reached at any hour. Please call [phone]  for any of the following symptoms:   Following upper endoscopy (EGD, EUS, ERCP, esophageal dilation) Vomiting of blood or coffee ground material  New, significant abdominal pain  New, significant chest pain or pain under the shoulder blades  Painful or  persistently difficult swallowing  New shortness of breath  Black, tarry-looking or red, bloody stools  FOLLOW UP:  If any biopsies were taken you will be contacted by phone or by letter within the next 1-3 weeks. Call [phone]  if you have not heard about the biopsies in 3 weeks.  Please also call with any specific questions about appointments or follow up tests.  

## 2016-06-09 NOTE — Anesthesia Preprocedure Evaluation (Signed)
Anesthesia Evaluation  Patient identified by MRN, date of birth, ID band Patient awake    Reviewed: Allergy & Precautions, H&P , NPO status , Patient's Chart, lab work & pertinent test results  Airway Mallampati: II   Neck ROM: full    Dental   Pulmonary sleep apnea ,    breath sounds clear to auscultation       Cardiovascular hypertension,  Rhythm:regular Rate:Normal     Neuro/Psych  Headaches,  Neuromuscular disease    GI/Hepatic GERD  ,  Endo/Other  Morbid obesity  Renal/GU      Musculoskeletal  (+) Arthritis ,   Abdominal   Peds  Hematology   Anesthesia Other Findings   Reproductive/Obstetrics                             Anesthesia Physical Anesthesia Plan  ASA: II  Anesthesia Plan: MAC   Post-op Pain Management:    Induction: Intravenous  Airway Management Planned: Nasal Cannula  Additional Equipment:   Intra-op Plan:   Post-operative Plan:   Informed Consent: I have reviewed the patients History and Physical, chart, labs and discussed the procedure including the risks, benefits and alternatives for the proposed anesthesia with the patient or authorized representative who has indicated his/her understanding and acceptance.     Plan Discussed with: CRNA, Anesthesiologist and Surgeon  Anesthesia Plan Comments:         Anesthesia Quick Evaluation

## 2016-06-09 NOTE — H&P (View-Only) (Signed)
Emily Gay    [id number]    05/09/1970  Primary Care Physician:McKeown, Chrissie NoaWilliam, MD  Referring Physician: Quentin Mullingollier, Amanda, PA-C [address] [place], [place] [id number]  Chief complaint:  Right upper quadrant abdominal pain  HPI: 46 year old female here for new patient visit with complaints of intermittent right upper quadrant abdominal pain since she had gallbladder surgery for chronic cholecystitis in 2011. She gets intermittent episodes of epigastric pain radiating to right upper quadrant with waxing and waning pain. Sometimes associated with nausea, abdominal cramps and diarrhea. She did notice some improvement since she was started on Protonix by her primary care physician Dr. Oneta RackMcKeown. She had abdominal ultrasound March 2017 which did not show any CBD obstruction and gallbladder fossa appeared normal. Patient never had EGD. Denies any dysphagia, odynophagia, melena or blood per rectum. Other medical history include lumbar spinal fusion surgery in 2016   Outpatient Encounter Prescriptions as of 06/01/2016  Medication Sig  . Cholecalciferol (VITAMIN D PO) Take 2,000 Units by mouth daily.   . fluticasone (FLONASE) 50 MCG/ACT nasal spray Place 2 sprays into both nostrils at bedtime.  Marland Kitchen. levocetirizine (XYZAL) 5 MG tablet Take 1 tablet (5 mg total) by mouth daily.  . methocarbamol (ROBAXIN) 500 MG tablet TAKE 1 TABLET (500 MG TOTAL) BY MOUTH 3 (THREE) TIMES DAILY AS NEEDED FOR MUSCLE SPASMS.  . Multiple Vitamin (MULTIVITAMIN) capsule Take 1 capsule by mouth daily.  . ondansetron (ZOFRAN) 4 MG tablet Take 1 tablet (4 mg total) by mouth daily as needed for nausea or vomiting (not sedatin).  . pantoprazole (PROTONIX) 40 MG tablet TAKE ONE TABLET BY MOUTH DAILY  . pregabalin (LYRICA) 150 MG capsule TAKE 1 CAPSULE BY MOUTH 3 TIMES DAILY  . promethazine (PHENERGAN) 25 MG tablet Take 1 tablet (25 mg total) by mouth every 6 (six) hours as needed for nausea or  vomiting. sedating  . sucralfate (CARAFATE) 1 g tablet Take 1 tablet (1 g total) by mouth 4 (four) times daily.   No facility-administered encounter medications on file as of 06/01/2016.      Allergies as of 06/01/2016  . (No Known Allergies)    Past Medical History:  Diagnosis Date  . Allergic rhinitis   . Anemia   . Arthritis   . Chronic low back pain   . GERD (gastroesophageal reflux disease)   . Headache    migraine  . Hypertension   . 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 Obesity   . OSA on CPAP    does not use CPAP    Past Surgical History:  Procedure Laterality Date  . ANTERIOR LAT LUMBAR FUSION N/A 10/22/2014   Procedure: ANTERIOR LATERAL LUMBAR FUSION 1 LEVEL;  Surgeon: Estill BambergMark Dumonski, MD;  Location: [facility] OR;  Service: Orthopedics;  Laterality: N/A;  . CHOLECYSTECTOMY  Feb. 2011   Gall Bladder    Family History  Problem Relation Age of Onset  . Cancer Mother        lyphoma  . Lung disease Mother   . Arthritis Father   . Hypertension Father   . Barrett's esophagus Father   . Melanoma Sister   . Fibromyalgia Sister   . Mental illness Other        emotional illness (other blood relative)  . Stroke Other        Stroke (parent and grandparent)  . Heart disease Other        (grandparent)  . Hypertension Other        (  other blood relative)  . Arthritis Other        Parent and grandparent  . Colon cancer Neg Hx   . Rectal cancer Neg Hx   . Stomach cancer Neg Hx   . Esophageal cancer Neg Hx   . Liver cancer Neg Hx     Social History   Social History  . Marital status: Married    Spouse name: N/A  . Number of children: 0  . Years of education: college   Occupational History  . Manager [employer]   Social History Main Topics  . Smoking status: Never Smoker  . Smokeless tobacco: Never Used  . Alcohol use Yes     Comment: occasionally  . Drug use: No  . Sexual activity: Not on file   Other Topics Concern  . 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      Review of systems: Review of Systems  Constitutional: Negative for fever and chills.  HENT: Negative.   Eyes: Negative for blurred vision.  Respiratory: Negative for cough, shortness of breath and wheezing.   Cardiovascular: Negative for chest pain and palpitations.  Gastrointestinal: as per HPI Genitourinary: Negative for dysuria, urgency, frequency and hematuria.  Musculoskeletal: Negative for myalgias, back pain and joint pain.  Skin: Negative for itching and rash.  Neurological: Negative for dizziness, tremors, focal weakness, seizures and loss of consciousness.  Endo/Heme/Allergies: Positive for seasonal allergies.  Psychiatric/Behavioral: Negative for depression, suicidal ideas and hallucinations.  All other systems reviewed and are negative.   Physical Exam: Vitals:   06/01/16 0904  BP: 104/74  Pulse: 88   Body mass index is 57.52 kg/m. Gen:      No acute distress HEENT:  EOMI, sclera anicteric Neck:     No masses; no thyromegaly Lungs:    Clear to auscultation bilaterally; normal respiratory effort CV:         Regular rate and rhythm; no murmurs Abd:      + bowel sounds; soft, non-tender; no palpable masses, no distension Ext:    No edema; adequate peripheral perfusion Skin:      Warm and dry; no rash Neuro: alert and oriented x 3 Psych: normal mood and affect  Data Reviewed:  Reviewed labs, radiology imaging, old records and pertinent past GI work up   Assessment and Plan/Recommendations:  46 year old female here with complaints of right upper quadrant pain and epigastric abdominal pain status post gallbladder surgery for chronic cholecystitis in 2011 with progressive worsening of symptoms Patient reports her symptoms are similar to what she had prior to  gallbladder surgery with cholecystitis Abdominal ultrasound did not show any retained CBD stone We'll check CBC, CMP Schedule MRCP to exclude CBD sludge or retained stone Patient has mild claustrophobia, prescription for 1 mg Xanax 2 tabs to take prior to the MRI  Epigastric abdominal pain somewhat better since she started PPI Schedule for EGD to exclude peptic ulcer disease, gastritis, esophagitis Continue Protonix 40 mg daily and antireflux measures The risks and benefits as well as alternatives of endoscopic procedure(s) have been discussed and reviewed. All questions answered. The patient agrees to proceed.     Iona Beard , MD [phone] Mon-Fri 8a-5p [phone] after 5p, weekends, holidays  CC: Quentin Mulling, [place]

## 2016-06-09 NOTE — Anesthesia Postprocedure Evaluation (Addendum)
Anesthesia Post Note  Patient: Emily Gay  Procedure(s) Performed: Procedure(s) (LRB): ESOPHAGOGASTRODUODENOSCOPY (EGD) WITH PROPOFOL (N/A)  Patient location during evaluation: PACU Anesthesia Type: MAC Level of consciousness: awake, awake and alert and oriented Pain management: pain level controlled Vital Signs Assessment: post-procedure vital signs reviewed and stable Respiratory status: spontaneous breathing Cardiovascular status: blood pressure returned to baseline and stable Anesthetic complications: no       Last Vitals:  Vitals:   06/09/16 1120 06/09/16 1130  BP: 125/69 130/66  Pulse: 81 88  Resp: 17 19  Temp:      Last Pain:  Vitals:   06/09/16 1115  TempSrc: Oral                 [name]

## 2016-06-11 ENCOUNTER — Encounter (HOSPITAL_COMMUNITY): Payer: Self-pay | Admitting: Gastroenterology

## 2016-06-22 ENCOUNTER — Encounter: Payer: Self-pay | Admitting: Gastroenterology

## 2016-07-19 ENCOUNTER — Other Ambulatory Visit: Payer: Self-pay

## 2016-07-19 MED ORDER — METHOCARBAMOL 500 MG PO TABS: ORAL_TABLET | ORAL | 1 refills | Status: DC

## 2016-07-25 ENCOUNTER — Ambulatory Visit: Payer: BLUE CROSS/BLUE SHIELD | Admitting: Gastroenterology

## 2016-08-01 ENCOUNTER — Ambulatory Visit (INDEPENDENT_AMBULATORY_CARE_PROVIDER_SITE_OTHER): Payer: BLUE CROSS/BLUE SHIELD | Admitting: Gastroenterology

## 2016-08-01 ENCOUNTER — Encounter: Payer: Self-pay | Admitting: Gastroenterology

## 2016-08-01 VITALS — BP 110/74 | HR 96 | Ht 65.0 in | Wt 294.8 lb

## 2016-08-01 DIAGNOSIS — R1013 Epigastric pain: Secondary | ICD-10-CM | POA: Diagnosis not present

## 2016-08-01 DIAGNOSIS — R110 Nausea: Secondary | ICD-10-CM

## 2016-08-01 MED ORDER — SUCRALFATE 1 G PO TABS: 1.0000 g | ORAL_TABLET | Freq: Three times a day (TID) | ORAL | 6 refills | Status: DC

## 2016-08-01 MED ORDER — PANTOPRAZOLE SODIUM 40 MG PO TBEC: 40.0000 mg | DELAYED_RELEASE_TABLET | Freq: Every day | ORAL | 4 refills | Status: DC

## 2016-08-01 NOTE — Progress Notes (Signed)
Emily Gay    [id number]    10/07/1970  Primary Care Physician:McKeown, Chrissie Noa, MD  Referring Physician: Lucky Cowboy, MD [address] [place], [place] [address]  Chief complaint:  Abd pain  HPI:  46 year old female here for follow-up visit. She was last seen in office 06/01/2016. She is status post cholecystectomy for cholecystitis. EGD showed mild gastritis otherwise unremarkable. Abdominal ultrasound 2017 did not show any evidence of CBD obstruction. MRI abdomen was unremarkable except for mild hepatic steatosis. Patient reports improvement of abdominal pain and nausea with PPI. Denies any change in bowel habits, vomiting, dysphagia, melena or blood per rectum.   Outpatient Encounter Prescriptions as of 08/01/2016  Medication Sig  . buPROPion (WELLBUTRIN XL) 150 MG 24 hr tablet Take 1 tablet (150 mg total) by mouth every morning.  . Cholecalciferol (VITAMIN D PO) Take 2,000 Units by mouth daily.   . fluticasone (FLONASE) 50 MCG/ACT nasal spray Place 2 sprays into both nostrils at bedtime.  Marland Kitchen levocetirizine (XYZAL) 5 MG tablet Take 1 tablet (5 mg total) by mouth daily.  Marland Kitchen LYRICA 150 MG capsule TAKE ONE CAPSULE BY MOUTH THREE TIMES DAILY  . methocarbamol (ROBAXIN) 500 MG tablet TAKE 1 TABLET (500 MG TOTAL) BY MOUTH 3 (THREE) TIMES DAILY AS NEEDED FOR MUSCLE SPASMS.  . Multiple Vitamin (MULTIVITAMIN) capsule Take 1 capsule by mouth daily.  . pantoprazole (PROTONIX) 40 MG tablet Take 1 tablet (40 mg total) by mouth daily.  . sucralfate (CARAFATE) 1 g tablet Take 1 tablet (1 g total) by mouth 4 (four) times daily -  with meals and at bedtime.  . [DISCONTINUED] pantoprazole (PROTONIX) 40 MG tablet TAKE ONE TABLET BY MOUTH DAILY  . [DISCONTINUED] sucralfate (CARAFATE) 1 g tablet Take 1 tablet (1 g total) by mouth 4 (four) times daily.   No facility-administered encounter medications on file as of 08/01/2016.     Allergies as of 08/01/2016  . (No Known  Allergies)    Past Medical History:  Diagnosis Date  . Allergic rhinitis   . Anemia   . Arthritis   . Chronic low back pain   . GERD (gastroesophageal reflux disease)   . Headache    migraine  . Hypertension   . 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 Obesity   . OSA on CPAP    does not use CPAP    Past Surgical History:  Procedure Laterality Date  . ANTERIOR LAT LUMBAR FUSION N/A 10/22/2014   Procedure: ANTERIOR LATERAL LUMBAR FUSION 1 LEVEL;  Surgeon: Estill Bamberg, MD;  Location: [facility] OR;  Service: Orthopedics;  Laterality: N/A;  . CHOLECYSTECTOMY  Feb. 2011   Gall Bladder  . ESOPHAGOGASTRODUODENOSCOPY (EGD) WITH PROPOFOL N/A 06/09/2016   Procedure: ESOPHAGOGASTRODUODENOSCOPY (EGD) WITH PROPOFOL;  Surgeon: Napoleon Form, MD;  Location: [facility];  Service: Endoscopy;  Laterality: N/A;    Family History  Problem Relation Age of Onset  . Cancer Mother        lyphoma  . Lung disease Mother   . Arthritis Father   . Hypertension Father   . Barrett's esophagus Father   . Melanoma Sister   . Fibromyalgia Sister   . Mental illness Other        emotional illness (other blood relative)  . Stroke Other        Stroke (parent and grandparent)  . Heart disease Other        (grandparent)  . Hypertension Other        (  other blood relative)  . Arthritis Other        Parent and grandparent  . Colon cancer Neg Hx   . Rectal cancer Neg Hx   . Stomach cancer Neg Hx   . Esophageal cancer Neg Hx   . Liver cancer Neg Hx     Social History   Social History  . Marital status: Married    Spouse name: N/A  . Number of children: 0  . Years of education: college   Occupational History  . Manager [employer]   Social History Main Topics  . Smoking status: Never Smoker  . Smokeless tobacco: Never Used  . Alcohol use Yes     Comment: occasionally  . Drug use: No  . Sexual activity: Not on file   Other Topics Concern  . 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      Review of systems: Review of Systems  Constitutional: Negative for fever and chills.  HENT: Negative.   Eyes: Negative for blurred vision.  Respiratory: Negative for cough, shortness of breath and wheezing.   Cardiovascular: Negative for chest pain and palpitations.  Gastrointestinal: as per HPI Genitourinary: Negative for dysuria, urgency, frequency and hematuria.  Musculoskeletal: Negative for myalgias, back pain and joint pain.  Skin: Negative for itching and rash.  Neurological: Negative for dizziness, tremors, focal weakness, seizures and loss of consciousness.  Endo/Heme/Allergies: Positive for seasonal allergies.  Psychiatric/Behavioral: Negative for depression, suicidal ideas and hallucinations.  All other systems reviewed and are negative.   Physical Exam: Vitals:   08/01/16 1448  BP: 110/74  Pulse: 96   Body mass index is 49.06 kg/m. Gen:      No acute distress HEENT:  EOMI, sclera anicteric Neck:     No masses; no thyromegaly Lungs:    Clear to auscultation bilaterally; normal respiratory effort CV:         Regular rate and rhythm; no murmurs Abd:      + bowel sounds; soft, non-tender; no palpable masses, no distension Ext:    No edema; adequate peripheral perfusion Skin:      Warm and dry; no rash Neuro: alert and oriented x 3 Psych: normal mood and affect  Data Reviewed:  Reviewed labs, radiology imaging, old records and pertinent past GI work up   Assessment and Plan/Recommendations:  46 year old female, obese, cholecystectomy here for follow-up visit  Right upper quadrant/epigastric abdominal pain associated with intermittent nausea: Improved with PPI daily Dyspepsia likely secondary to chronic gastritis Continue Protonix 40 mg daily, 30 minutes before  breakfast Carafate as needed before meals  Return in 1 year or sooner if needed  15 minutes was spent face-to-face with the patient. Greater than 50% of the time used for counseling as well as treatment plan and follow-up. She had multiple questions which were answered to her satisfaction  K. Scherry RanVeena Araceli Coufal , MD [phone] Mon-Fri 8a-5p [phone] after 5p, weekends, holidays  CC: Lucky CowboyMcKeown, William, MD

## 2016-08-01 NOTE — Patient Instructions (Addendum)
Continue Pantoprazole , we will send in refills to your pharmacy  Continue to use Carafate as needed before meals and at bedtime   Follow up in 1 year

## 2016-09-13 NOTE — Addendum Note (Signed)
Addendum  created 09/13/16 1420 by Juris Gosnell, MD   Sign clinical note    

## 2016-12-27 IMAGING — CR DG CHEST 2V
2 series · 2 of 2 positions shown · non-contrast
Comparison: Radiographs 03/01/2009.

CLINICAL DATA: Pre-op for lumbar spine surgery.  Initial encounter.

EXAM:
CHEST  2 VIEW

[w chest pa]
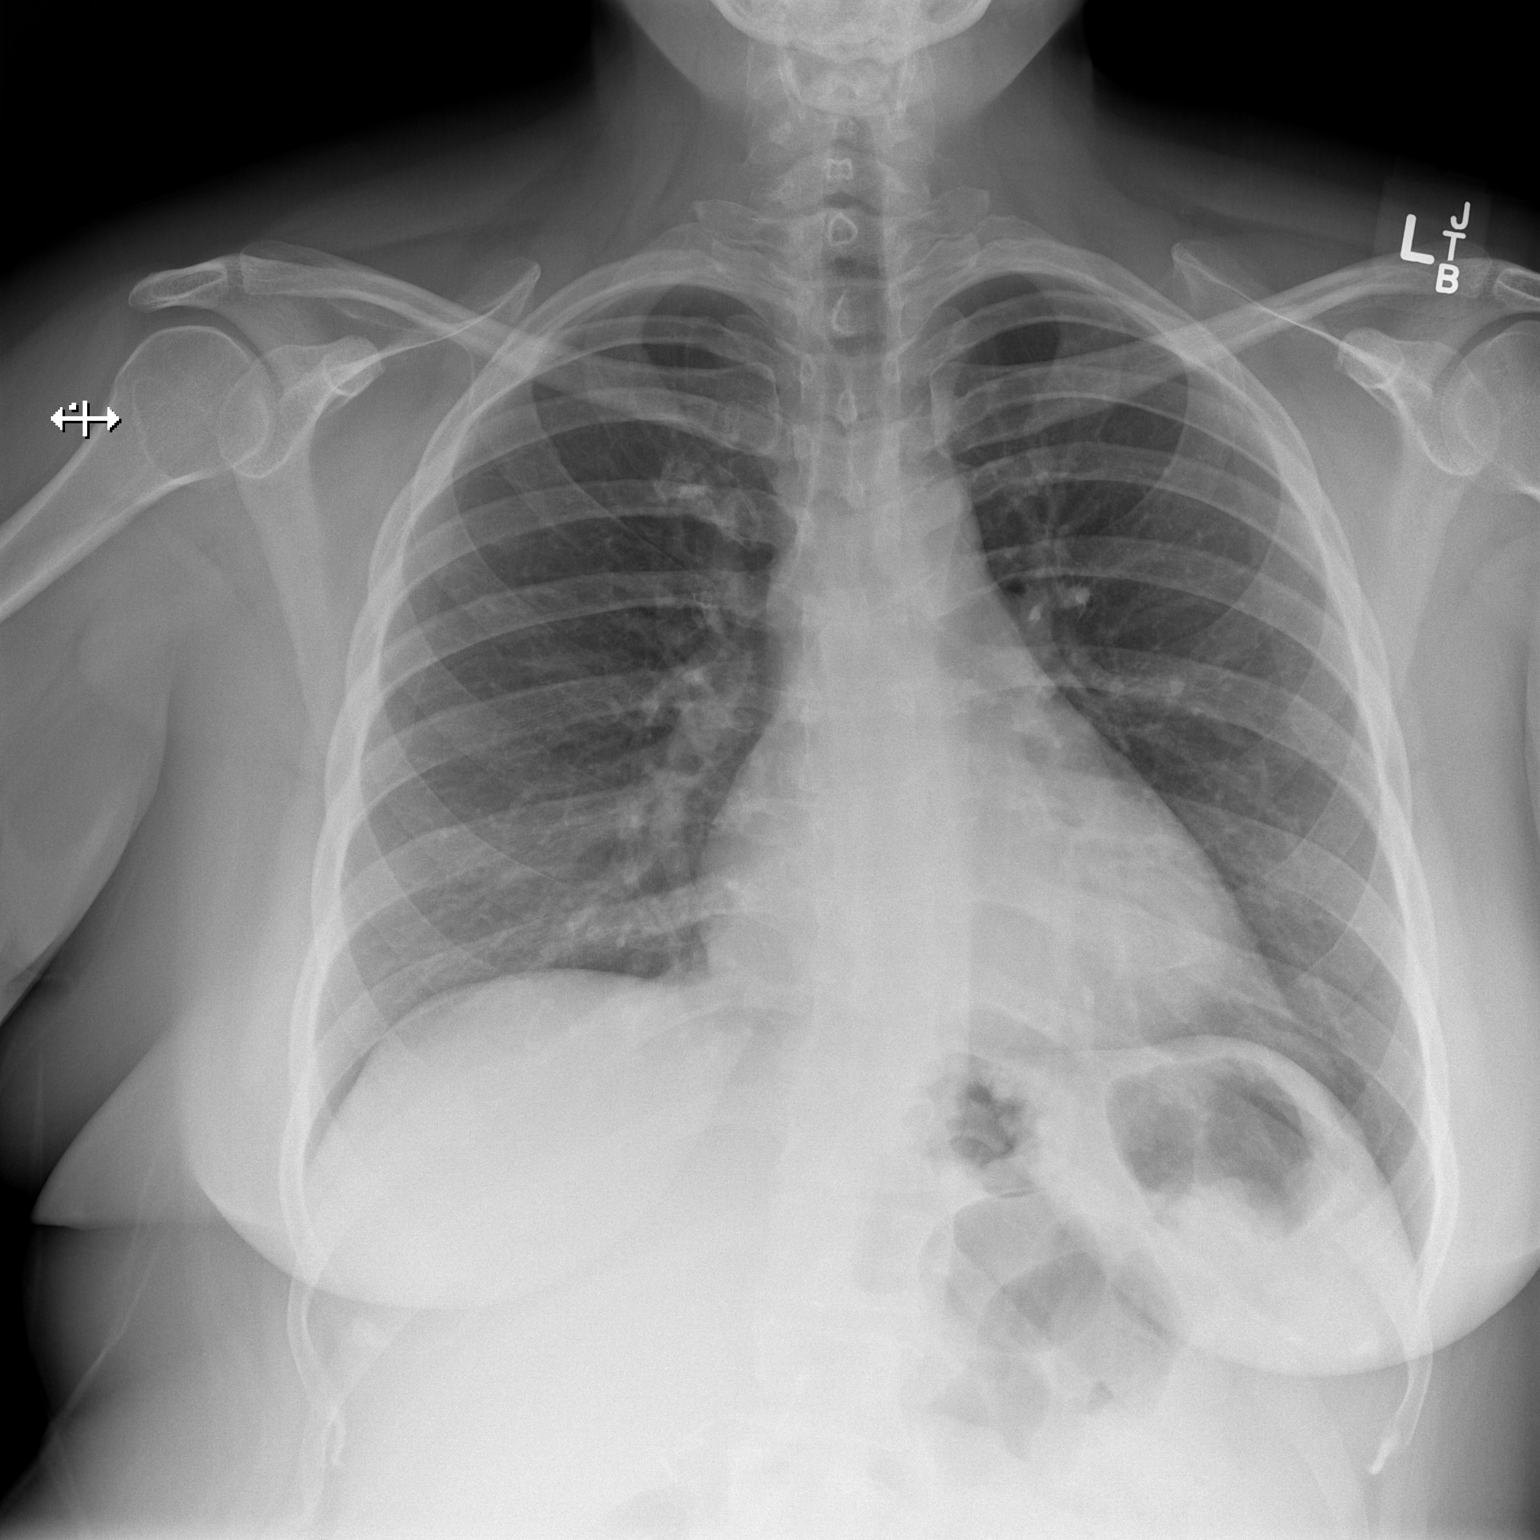

[w chest lat]
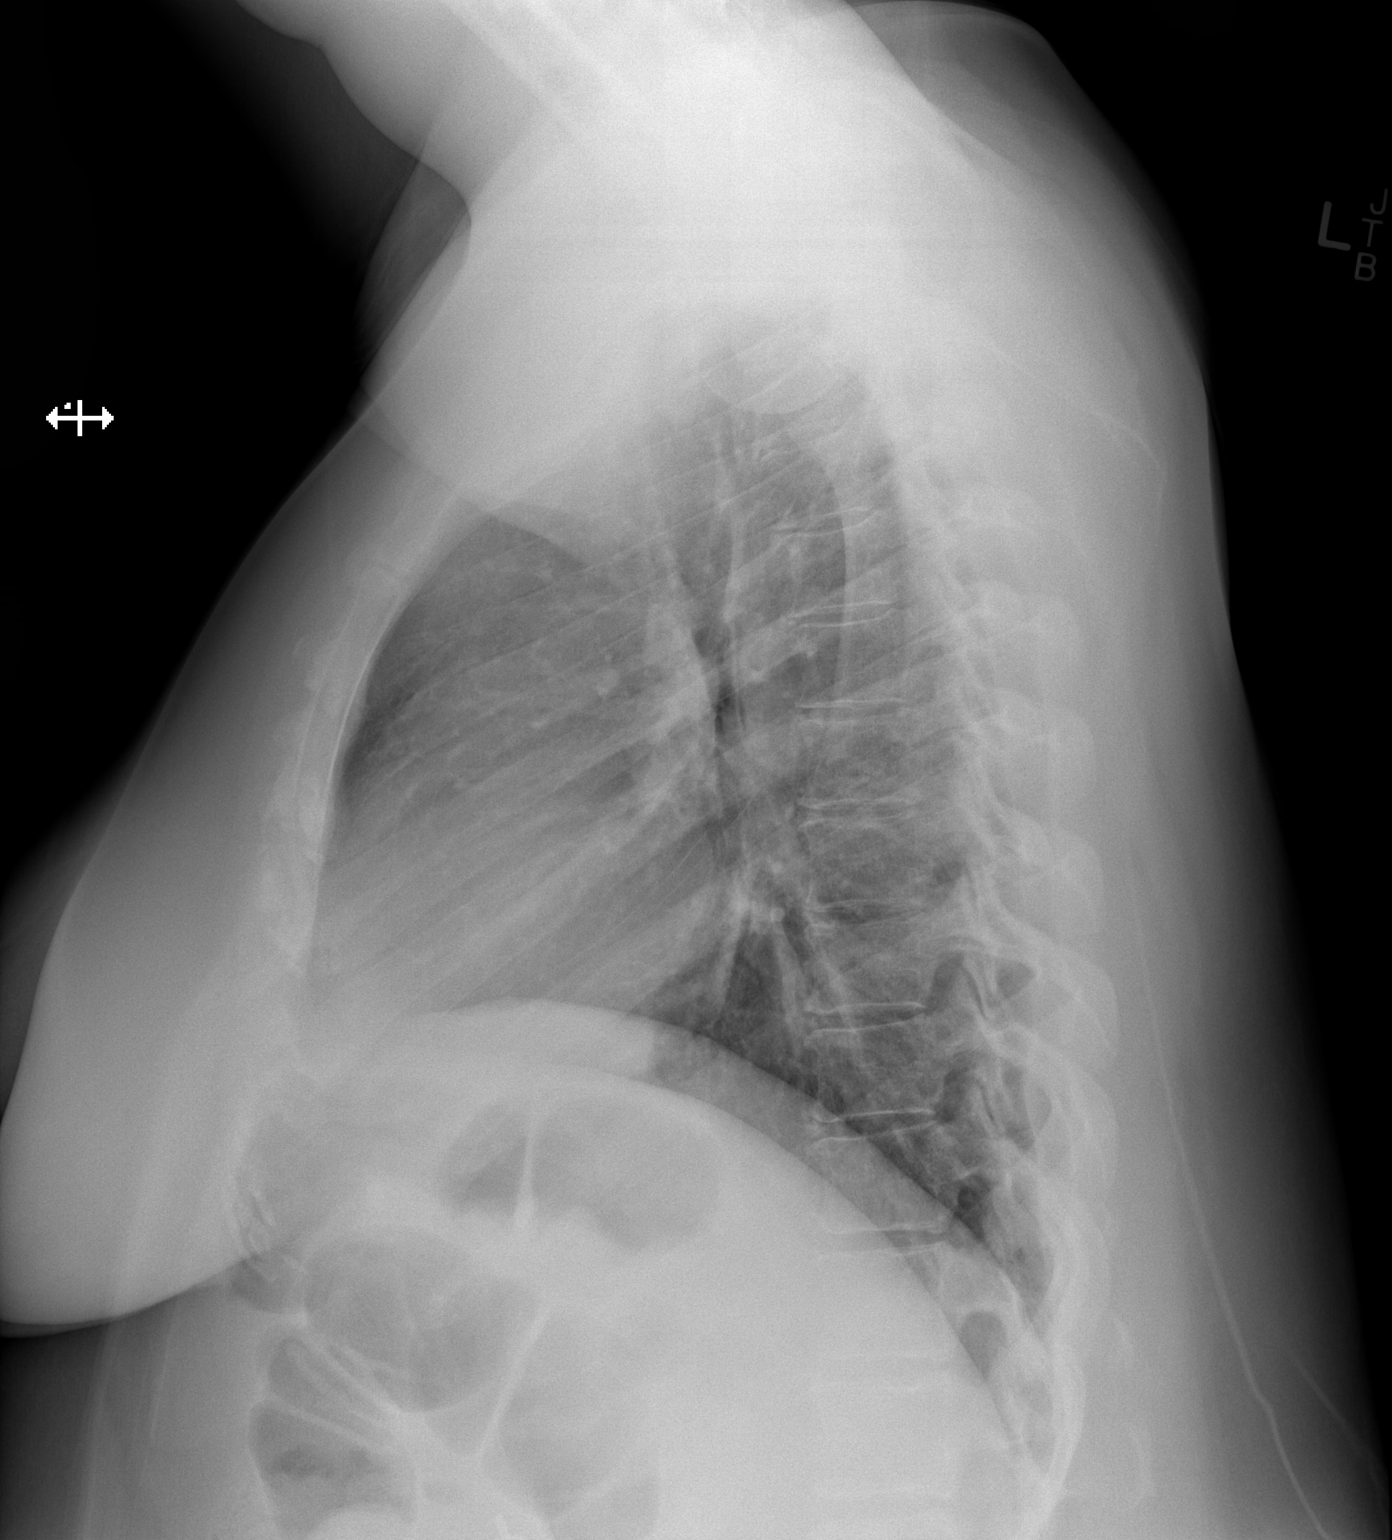

[2 of 2 positions shown; findings below may reference images not displayed]

FINDINGS: The heart size and mediastinal contours are normal. The lungs are
clear. There is no pleural effusion or pneumothorax. No acute
osseous findings are identified.
IMPRESSION: Stable chest.  No active cardiopulmonary process.

## 2017-03-14 ENCOUNTER — Other Ambulatory Visit: Payer: Self-pay | Admitting: Physician Assistant

## 2017-04-09 ENCOUNTER — Encounter: Payer: Self-pay | Admitting: Physician Assistant

## 2017-06-05 ENCOUNTER — Encounter: Payer: Self-pay | Admitting: Physician Assistant

## 2017-09-10 ENCOUNTER — Other Ambulatory Visit: Payer: Self-pay | Admitting: Internal Medicine

## 2017-09-10 ENCOUNTER — Other Ambulatory Visit: Payer: Self-pay | Admitting: Physician Assistant

## 2017-10-01 ENCOUNTER — Other Ambulatory Visit: Payer: Self-pay | Admitting: Physician Assistant

## 2017-10-21 NOTE — Progress Notes (Signed)
Complete Physical  Assessment and Plan: Essential hypertension - continue medications, DASH diet, exercise and monitor at home. Call if greater than 130/80.  - CBC with Differential/Platelet - BASIC METABOLIC PANEL WITH GFR - Hepatic function panel - TSH - Urinalysis, Routine w reflex microscopic (not at [facility]) - Microalbumin / creatinine urine ratio - EKG 12-Lead  Prediabetes Discussed general issues about diabetes pathophysiology and management., Educational material distributed., Suggested low cholesterol diet., - Hemoglobin A1c - Insulin, fasting   Morbid obesity, unspecified obesity type (HCC) Obesity with co morbidities- long discussion about weight loss, diet, and exercise -     buPROPion (WELLBUTRIN XL) 150 MG 24 hr tablet; Take 1 tablet (150 mg total) by mouth every morning.    Hyperlipidemia -continue medications, check lipids, decrease fatty foods, increase activity.  - Lipid panel   GET MGM  Vitamin D deficiency - VITAMIN D 25 Hydroxy (Vit-D Deficiency, Fractures)  Encounter for long-term (current) use of medications - Magnesium  ANEMIA, MILD - Iron and TIBC - Ferritin - Vitamin B12  Nonintractable headache, unspecified chronicity pattern, unspecified headache type Continue lyrica Will try trial of emgality for possible migraines as a cause, if it does not help follow up neuro   Nocturnal myoclonus Better with lyrica but starting again Will try trial of emgality for possible migraines as a cause, if it does not help follow up neuro  Arthralgia of temporomandibular joint, unspecified laterality monitor   OSA (obstructive sleep apnea) Not on CPAP, continue weight loss ? Contributing to her migraines/myclonus- suggested looking into mouth piece   GERD Continue PPI/H2 blocker, diet discussed Follow up GI   Discussed med's effects and SE's. Screening labs and tests as requested with regular follow-up as recommended. Future Appointments  Date Time  Provider Department Center  10/24/2018  2:00 PM Quentin Mulling, PA-C [id number] None    HPI  47 y.o. female  presents for a complete physical.  Her blood pressure has been controlled at home, today their BP is BP: 132/82 She does not workout due to chronic pain. She denies chest pain, shortness of breath, dizziness.   She had L4-L5 fusion with Dr. Yevette Edwards in Sept 2016.   She has GERD and had EGD 06/06/2016 and MRCP 06/06/2016.   BMI is Body mass index is 49.86 kg/m., she admits that she has been stressed and not caring as much about her weight, requesting wellbutrin.   She is morbidly obese, had history of + OSA but unable to tolerate CPAP, last sleep study 2015.  Has seen neurology in the past for episodes of memory confusion, nerve pain randomly, and fasciculations only thing that helps is sleep , Dr. Anne Hahn in 2013, lyrica has helped but she states it is getting worse, now 2-3 x a month, worse with less sleep, stress, during menses. She has been having more headaches and migraines. Has had normal EEG, normal MRI.   Wt Readings from Last 3 Encounters:  10/23/17 299 lb 9.6 oz (135.9 kg)  08/01/16 294 lb 12.8 oz (133.7 kg)  06/09/16 294 lb (133.4 kg)    She is not on cholesterol medication and denies myalgias. Her cholesterol is at goal. The cholesterol last visit was:   Lab Results  Component Value Date   CHOL 168 06/01/2016   HDL 46 (L) 06/01/2016   LDLCALC 95 06/01/2016   LDLDIRECT 134.4 07/26/2011   TRIG 133 06/01/2016   CHOLHDL 3.7 06/01/2016   She has been working on diet and exercise for prediabetes, and  denies polydipsia, polyuria and visual disturbances. Last A1C in the office was:  Lab Results  Component Value Date   HGBA1C 5.6 06/01/2016   Patient is on Vitamin D supplement, 5000 IU daily.   Lab Results  Component Value Date   VD25OH 25 (L) 01/20/2015      Current Medications:  Current Outpatient Medications on File Prior to Visit  Medication Sig Dispense  Refill  . Cholecalciferol (VITAMIN D PO) Take 2,000 Units by mouth daily.     . fluticasone (FLONASE) 50 MCG/ACT nasal spray Place 2 sprays into both nostrils at bedtime. 16 g 3  . levocetirizine (XYZAL) 5 MG tablet Take 1 tablet (5 mg total) by mouth daily. 30 tablet 3  . LYRICA 150 MG capsule TAKE ONE CAPSULE BY MOUTH THREE TIMES A DAY 270 capsule 0  . methocarbamol (ROBAXIN) 500 MG tablet TAKE 1 TABLET (500 MG TOTAL) BY MOUTH 3 (THREE) TIMES DAILY AS NEEDED FOR MUSCLE SPASMS. 90 tablet 1  . Multiple Vitamin (MULTIVITAMIN) capsule Take 1 capsule by mouth daily.    . pantoprazole (PROTONIX) 40 MG tablet Take 1 tablet (40 mg total) by mouth daily. 90 tablet 4  . sucralfate (CARAFATE) 1 g tablet Take 1 tablet (1 g total) by mouth 4 (four) times daily -  with meals and at bedtime. 120 tablet 6  . buPROPion (WELLBUTRIN XL) 150 MG 24 hr tablet Take 1 tablet (150 mg total) by mouth every morning. (Patient not taking: Reported on 10/23/2017) 30 tablet 2   No current facility-administered medications on file prior to visit.    Health Maintenance:   Immunization History  Administered Date(s) Administered  . Influenza Split 11/13/2011, 11/13/2013  . Influenza Whole 11/25/2008  . Influenza, Seasonal, Injecte, Preservative Fre 12/09/2015  . Td 01/23/1990  . Tdap 08/21/2012   Tetanus: 2014 Pneumovax:  N/A Prevnar 13: N/A Flu vaccine: 2017 Zostavax: N/A  LMP: 1 week ago, not regular Pap: 07/2012 due 2019  MGM: 08/2012  DUE DEXA: N/A Colonoscopy: N/a EGD: N/A Last Eye Exam: Dr. Shea Evans Has seen Dr. Juanetta Gosling  Patient Care Team: Lucky Cowboy, MD as PCP - General (Internal Medicine) Estill Bamberg, MD as Consulting Physician (Orthopedic Surgery) York Spaniel, MD as Consulting Physician (Neurology)  Medical History:  Past Medical History:  Diagnosis Date  . Allergic rhinitis   . Anemia   . Arthritis   . Chronic low back pain   . GERD (gastroesophageal reflux disease)   . Headache     migraine  . Hypertension   . 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 Obesity   . OSA on CPAP    does not use CPAP   Allergies No Known Allergies  SURGICAL HISTORY She  has a past surgical history that includes Cholecystectomy (Feb. 2011); Anterior lat lumbar fusion (N/A, 10/22/2014); and Esophagogastroduodenoscopy (egd) with propofol (N/A, 06/09/2016). FAMILY HISTORY Her family history includes Arthritis in her father and other; Barrett's esophagus in her father; Cancer in her mother; Fibromyalgia in her sister; Heart disease in her other; Hypertension in her father and other; Lung disease in her mother; Melanoma in her sister; Mental illness in her other; Stroke in her other. SOCIAL HISTORY She  reports that she has never smoked. She has never used smokeless tobacco. She reports that she drinks alcohol. She reports that she does not use drugs.   Review of Systems:  Review of Systems  Constitutional: Negative.   HENT: Negative for congestion, ear discharge, ear pain, hearing loss, nosebleeds, sore  throat and tinnitus.   Eyes: Negative.   Respiratory: Negative.  Negative for stridor.   Cardiovascular: Negative.   Gastrointestinal: Positive for heartburn and nausea. Negative for abdominal pain, constipation and diarrhea.  Genitourinary: Negative.   Musculoskeletal: Positive for back pain. Negative for falls, joint pain, myalgias and neck pain.  Skin: Negative.   Neurological: Headaches:  TMJ.  Endo/Heme/Allergies: Negative.   Psychiatric/Behavioral: Negative.      Physical Exam: Estimated body mass index is 49.86 kg/m as calculated from the following:   Height as of this encounter: 5\' 5"  (1.651 m).   Weight as of this encounter: 299 lb 9.6 oz (135.9 kg). BP 132/82   Pulse 86   Temp (!) 97.5 F (36.4 C)   Ht 5\' 5"  (1.651 m)   Wt 299 lb 9.6 oz (135.9 kg)   LMP 10/15/2017 (LMP Unknown)   SpO2 98%   BMI 49.86 kg/m  General Appearance: Well nourished, in no apparent distress.   Eyes: PERRLA, EOMs, conjunctiva no swelling or erythema, normal fundi and vessels.  Sinuses: No Frontal/maxillary tenderness  ENT/Mouth: Ext aud canals clear, normal light reflex with TMs without erythema, bulging. Good dentition. No erythema, swelling, or exudate on post pharynx. Tonsils not swollen or erythematous. Hearing normal.  Neck: Supple, thyroid normal. No bruits  Respiratory: Respiratory effort normal, BS equal bilaterally without rales, rhonchi, wheezing or stridor.  Cardio: RRR without murmurs, rubs or gallops. Brisk peripheral pulses with 1+edema.  Chest: symmetric, with normal excursions and percussion.  Breasts: Symmetric, without lumps, nipple discharge, retractions.  Abdomen: Soft, nontender, obese no guarding, rebound, hernias, masses, or organomegaly. .  Lymphatics: Non tender without lymphadenopathy.  Genitourinary: diffucult exam due to body habitus- pap sent vaginal swab Musculoskeletal: Full ROM all peripheral extremities,5/5 strength, pain with change in position. Skin: Warm, dry without rashes, lesions, ecchymosis. Neuro: Cranial nerves intact, reflexes equal bilaterally. Normal muscle tone, no cerebellar symptoms. Sensation intact.  Psych: Awake and oriented X 3, normal affect, Insight and Judgment appropriate.   EKG: WNL no changes.   Quentin Mulling 2:05 PM [facility]

## 2017-10-23 ENCOUNTER — Ambulatory Visit (INDEPENDENT_AMBULATORY_CARE_PROVIDER_SITE_OTHER): Payer: BLUE CROSS/BLUE SHIELD | Admitting: Physician Assistant

## 2017-10-23 ENCOUNTER — Other Ambulatory Visit (HOSPITAL_COMMUNITY)
Admission: RE | Admit: 2017-10-23 | Discharge: 2017-10-23 | Disposition: A | Discharge status: Home / Self Care | Payer: BLUE CROSS/BLUE SHIELD | Source: Ambulatory Visit | Attending: Physician Assistant | Admitting: Physician Assistant

## 2017-10-23 ENCOUNTER — Encounter: Payer: Self-pay | Admitting: Physician Assistant

## 2017-10-23 VITALS — BP 132/82 | HR 86 | Temp 97.5°F | Ht 65.0 in | Wt 299.6 lb

## 2017-10-23 DIAGNOSIS — J302 Other seasonal allergic rhinitis: Secondary | ICD-10-CM

## 2017-10-23 DIAGNOSIS — G4769 Other sleep related movement disorders: Secondary | ICD-10-CM

## 2017-10-23 DIAGNOSIS — G4733 Obstructive sleep apnea (adult) (pediatric): Secondary | ICD-10-CM

## 2017-10-23 DIAGNOSIS — Z124 Encounter for screening for malignant neoplasm of cervix: Secondary | ICD-10-CM

## 2017-10-23 DIAGNOSIS — R519 Headache, unspecified: Secondary | ICD-10-CM

## 2017-10-23 DIAGNOSIS — R1013 Epigastric pain: Secondary | ICD-10-CM

## 2017-10-23 DIAGNOSIS — Z136 Encounter for screening for cardiovascular disorders: Secondary | ICD-10-CM | POA: Diagnosis not present

## 2017-10-23 DIAGNOSIS — E8881 Metabolic syndrome: Secondary | ICD-10-CM

## 2017-10-23 DIAGNOSIS — Z13220 Encounter for screening for lipoid disorders: Secondary | ICD-10-CM

## 2017-10-23 DIAGNOSIS — Z1389 Encounter for screening for other disorder: Secondary | ICD-10-CM

## 2017-10-23 DIAGNOSIS — Z79899 Other long term (current) drug therapy: Secondary | ICD-10-CM | POA: Diagnosis not present

## 2017-10-23 DIAGNOSIS — R51 Headache: Secondary | ICD-10-CM

## 2017-10-23 DIAGNOSIS — M26629 Arthralgia of temporomandibular joint, unspecified side: Secondary | ICD-10-CM

## 2017-10-23 DIAGNOSIS — K2100 Gastro-esophageal reflux disease with esophagitis, without bleeding: Secondary | ICD-10-CM

## 2017-10-23 DIAGNOSIS — I10 Essential (primary) hypertension: Secondary | ICD-10-CM

## 2017-10-23 DIAGNOSIS — D649 Anemia, unspecified: Secondary | ICD-10-CM

## 2017-10-23 DIAGNOSIS — R7303 Prediabetes: Secondary | ICD-10-CM

## 2017-10-23 DIAGNOSIS — Z0000 Encounter for general adult medical examination without abnormal findings: Secondary | ICD-10-CM

## 2017-10-23 DIAGNOSIS — Z0001 Encounter for general adult medical examination with abnormal findings: Secondary | ICD-10-CM

## 2017-10-23 DIAGNOSIS — Z1329 Encounter for screening for other suspected endocrine disorder: Secondary | ICD-10-CM | POA: Diagnosis not present

## 2017-10-23 DIAGNOSIS — Z131 Encounter for screening for diabetes mellitus: Secondary | ICD-10-CM | POA: Diagnosis not present

## 2017-10-23 DIAGNOSIS — E559 Vitamin D deficiency, unspecified: Secondary | ICD-10-CM

## 2017-10-23 DIAGNOSIS — E782 Mixed hyperlipidemia: Secondary | ICD-10-CM

## 2017-10-23 DIAGNOSIS — M5410 Radiculopathy, site unspecified: Secondary | ICD-10-CM

## 2017-10-23 MED ORDER — PANTOPRAZOLE SODIUM 40 MG PO TBEC: 40.0000 mg | DELAYED_RELEASE_TABLET | Freq: Every day | ORAL | 4 refills | Status: DC

## 2017-10-23 MED ORDER — BUPROPION HCL ER (XL) 150 MG PO TB24: 150.0000 mg | ORAL_TABLET | ORAL | 2 refills | Status: DC

## 2017-10-23 MED ORDER — METHOCARBAMOL 500 MG PO TABS: ORAL_TABLET | ORAL | 1 refills | Status: DC

## 2017-10-23 MED ORDER — GALCANEZUMAB-GNLM 120 MG/ML ~~LOC~~ SOAJ: 120.0000 mg | SUBCUTANEOUS | 0 refills | Status: DC

## 2017-10-23 MED ORDER — SUCRALFATE 1 G PO TABS: 1.0000 g | ORAL_TABLET | Freq: Three times a day (TID) | ORAL | 2 refills | Status: DC

## 2017-10-23 NOTE — Patient Instructions (Addendum)
Can call for possible dental piece Dr. Toni Arthurs to get evaluated for dental sleep appliance. # [phone]  Dr. Reatha Armour in [facility] # [phone]  Dr. Althea Grimmer  Phone: # [phone]  We will send notes. Call and get price quote.   ? Migraines or not Take the loading dose of emgality samples  HOW TO SCHEDULE A MAMMOGRAM  [facility]  7 a.m.-6:30 p.m., Monday 7 a.m.-5 p.m., Tuesday-Friday Schedule an appointment by calling [phone].

## 2017-10-24 LAB — COMPLETE METABOLIC PANEL WITH GFR
AG Ratio: 1.4 (calc) (ref 1.0–2.5)
ALBUMIN MSPROF: 4.1 g/dL (ref 3.6–5.1)
ALKALINE PHOSPHATASE (APISO): 82 U/L (ref 33–115)
ALT: 11 U/L (ref 6–29)
AST: 13 U/L (ref 10–35)
BUN: 10 mg/dL (ref 7–25)
CO2: 29 mmol/L (ref 20–32)
Calcium: 9.3 mg/dL (ref 8.6–10.2)
Chloride: 102 mmol/L (ref 98–110)
Creat: 0.61 mg/dL (ref 0.50–1.10)
GFR, EST NON AFRICAN AMERICAN: 109 mL/min/{1.73_m2} (ref >=60)
GFR, Est African American: 127 mL/min/{1.73_m2} (ref >=60)
GLOBULIN: 2.9 g/dL (ref 1.9–3.7)
Glucose, Bld: 77 mg/dL (ref 65–99)
Potassium: 4.1 mmol/L (ref 3.5–5.3)
SODIUM: 139 mmol/L (ref 135–146)
Total Bilirubin: 0.3 mg/dL (ref 0.2–1.2)
Total Protein: 7 g/dL (ref 6.1–8.1)

## 2017-10-24 LAB — URINALYSIS, ROUTINE W REFLEX MICROSCOPIC
BILIRUBIN URINE: NEGATIVE
GLUCOSE, UA: NEGATIVE
HGB URINE DIPSTICK: NEGATIVE
Ketones, ur: NEGATIVE
Leukocytes, UA: NEGATIVE
Nitrite: NEGATIVE
PH: 6 (ref 5.0–8.0)
PROTEIN: NEGATIVE
Specific Gravity, Urine: 1.014 (ref 1.001–1.03)

## 2017-10-24 LAB — VITAMIN D 25 HYDROXY (VIT D DEFICIENCY, FRACTURES): Vit D, 25-Hydroxy: 33 ng/mL (ref 30–100)

## 2017-10-24 LAB — CBC WITH DIFFERENTIAL/PLATELET
BASOS ABS: 39 {cells}/uL (ref 0–200)
Basophils Relative: 0.4 %
EOS ABS: 186 {cells}/uL (ref 15–500)
EOS PCT: 1.9 %
HEMATOCRIT: 36.7 % (ref 35.0–45.0)
HEMOGLOBIN: 12.2 g/dL (ref 11.7–15.5)
LYMPHS ABS: 2234 {cells}/uL (ref 850–3900)
MCH: 26.9 pg — ABNORMAL LOW (ref 27.0–33.0)
MCHC: 33.2 g/dL (ref 32.0–36.0)
MCV: 80.8 fL (ref 80.0–100.0)
MPV: 10.2 fL (ref 7.5–12.5)
Monocytes Relative: 5.8 %
NEUTROS ABS: 6772 {cells}/uL (ref 1500–7800)
Neutrophils Relative %: 69.1 %
Platelets: 391 10*3/uL (ref 140–400)
RBC: 4.54 10*6/uL (ref 3.80–5.10)
RDW: 13.3 % (ref 11.0–15.0)
Total Lymphocyte: 22.8 %
WBC: 9.8 10*3/uL (ref 3.8–10.8)
WBCMIX: 568 {cells}/uL (ref 200–950)

## 2017-10-24 LAB — MICROALBUMIN / CREATININE URINE RATIO
Creatinine, Urine: 118 mg/dL (ref 20–275)
Microalb Creat Ratio: 3 mcg/mg creat (ref <30)
Microalb, Ur: 0.4 mg/dL

## 2017-10-24 LAB — MAGNESIUM: MAGNESIUM: 1.8 mg/dL (ref 1.5–2.5)

## 2017-10-24 LAB — LIPID PANEL
CHOL/HDL RATIO: 4.3 (calc) (ref <5.0)
Cholesterol: 183 mg/dL (ref <200)
HDL: 43 mg/dL — ABNORMAL LOW (ref >50)
LDL Cholesterol (Calc): 118 mg/dL (calc) — ABNORMAL HIGH
Non-HDL Cholesterol (Calc): 140 mg/dL (calc) — ABNORMAL HIGH (ref <130)
Triglycerides: 114 mg/dL (ref <150)

## 2017-10-24 LAB — TSH: TSH: 2.37 m[IU]/L

## 2017-10-24 LAB — HEMOGLOBIN A1C
EAG (MMOL/L): 7 (calc)
HEMOGLOBIN A1C: 6 %{Hb} — AB (ref <5.7)
MEAN PLASMA GLUCOSE: 126 (calc)

## 2017-10-25 LAB — CYTOLOGY - PAP
Diagnosis: NEGATIVE
HPV: NOT DETECTED

## 2017-12-30 ENCOUNTER — Other Ambulatory Visit: Payer: Self-pay | Admitting: Physician Assistant

## 2018-01-30 ENCOUNTER — Ambulatory Visit: Payer: Self-pay | Admitting: Physician Assistant

## 2018-04-01 ENCOUNTER — Telehealth: Payer: BLUE CROSS/BLUE SHIELD | Admitting: Physician Assistant

## 2018-04-01 ENCOUNTER — Encounter: Payer: Self-pay | Admitting: Physician Assistant

## 2018-04-01 ENCOUNTER — Ambulatory Visit: Payer: Self-pay | Admitting: Physician Assistant

## 2018-04-01 ENCOUNTER — Encounter (HOSPITAL_COMMUNITY): Payer: Self-pay | Admitting: Emergency Medicine

## 2018-04-01 ENCOUNTER — Other Ambulatory Visit: Payer: Self-pay

## 2018-04-01 ENCOUNTER — Ambulatory Visit (HOSPITAL_COMMUNITY)
Admission: EM | Admit: 2018-04-01 | Discharge: 2018-04-01 | Disposition: A | Discharge status: Home / Self Care | Payer: 59 | Attending: Internal Medicine | Admitting: Internal Medicine

## 2018-04-01 DIAGNOSIS — R05 Cough: Secondary | ICD-10-CM

## 2018-04-01 DIAGNOSIS — B9789 Other viral agents as the cause of diseases classified elsewhere: Secondary | ICD-10-CM

## 2018-04-01 DIAGNOSIS — J00 Acute nasopharyngitis [common cold]: Secondary | ICD-10-CM | POA: Insufficient documentation

## 2018-04-01 DIAGNOSIS — J029 Acute pharyngitis, unspecified: Secondary | ICD-10-CM

## 2018-04-01 DIAGNOSIS — R6889 Other general symptoms and signs: Secondary | ICD-10-CM

## 2018-04-01 DIAGNOSIS — Z789 Other specified health status: Secondary | ICD-10-CM

## 2018-04-01 LAB — POCT RAPID STREP A: Streptococcus, Group A Screen (Direct): NEGATIVE

## 2018-04-01 MED ORDER — BENZONATATE 100 MG PO CAPS: 100.0000 mg | ORAL_CAPSULE | Freq: Three times a day (TID) | ORAL | 0 refills | Status: DC

## 2018-04-01 NOTE — ED Provider Notes (Addendum)
[facility]    CSN: [id number] Arrival date & time: 04/01/18  1912     History   Chief Complaint Chief Complaint  Patient presents with  . URI    HPI Emily Gay is a 48 y.o. female with a history of possible fibromyalgia on Lyrica comes to urgent care with complaints of runny nose, sore throat and subjective fever of a few days duration.  Patient returned from [place] through [place] a few days ago and she had already started having symptoms prior to leaving [place].  She denies any generalized body aches.  No high-grade fevers.  No headaches.  No shortness of breath.  She has a cough which is not productive of sputum.    Past Medical History:  Diagnosis Date  . Allergic rhinitis   . Anemia   . Arthritis   . Chronic low back pain   . GERD (gastroesophageal reflux disease)   . Headache    migraine  . Hypertension   . 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 Obesity   . OSA on CPAP    does not use CPAP    Patient Active Problem List   Diagnosis Date Noted  . Radiculopathy 10/22/2014  . OSA (obstructive sleep apnea) 10/17/2013  . 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 Headache 08/13/2013  . GERD 05/16/2013  . Hyperlipidemia 05/16/2013  . Prediabetes 05/16/2013  . Encounter for long-term (current) use of medications 05/16/2013  . Insulin resistance syndrome 05/16/2013  . Vitamin D deficiency 02/21/2013  . TMJ PAIN 11/24/2009  . ANEMIA, MILD 08/19/2009  . Morbid obesity (BMI 50) 10/14/2008  . Essential hypertension 10/14/2008  . Allergic rhinitis 10/13/2008    Past Surgical History:  Procedure Laterality Date  . ANTERIOR LAT LUMBAR FUSION N/A 10/22/2014   Procedure: ANTERIOR LATERAL LUMBAR FUSION 1 LEVEL;  Surgeon: Estill Bamberg, MD;  Location: [facility] OR;  Service: Orthopedics;  Laterality: N/A;  . CHOLECYSTECTOMY  Feb. 2011   Gall Bladder  . ESOPHAGOGASTRODUODENOSCOPY (EGD) WITH PROPOFOL N/A 06/09/2016   Procedure: ESOPHAGOGASTRODUODENOSCOPY (EGD) WITH PROPOFOL;   Surgeon: Napoleon Form, MD;  Location: [facility];  Service: Endoscopy;  Laterality: N/A;    OB History   No obstetric history on file.      Home Medications    Prior to Admission medications   Medication Sig Start Date End Date Taking? Authorizing Provider  buPROPion (WELLBUTRIN XL) 150 MG 24 hr tablet Take 1 tablet (150 mg total) by mouth every morning. 10/23/17 10/23/18  Quentin Mulling, PA-C  Cholecalciferol (VITAMIN D PO) Take 2,000 Units by mouth daily.     [provider]  fluticasone (FLONASE) 50 MCG/ACT nasal spray Place 2 sprays into both nostrils at bedtime. 12/09/15   Quentin Mulling, PA-C  Galcanezumab-gnlm [facility]) 120 MG/ML SOAJ Inject 120 mg into the skin every 30 (thirty) days. 10/23/17   Quentin Mulling, PA-C  levocetirizine (XYZAL) 5 MG tablet Take 1 tablet (5 mg total) by mouth daily. 12/09/15   Quentin Mulling, PA-C  LYRICA 150 MG capsule TAKE ONE CAPSULE BY MOUTH THREE TIMES A DAY 12/30/17   Lucky Cowboy, MD  methocarbamol (ROBAXIN) 500 MG tablet TAKE 1 TABLET (500 MG TOTAL) BY MOUTH 3 (THREE) TIMES DAILY AS NEEDED FOR MUSCLE SPASMS. 10/23/17   Quentin Mulling, PA-C  Multiple Vitamin (MULTIVITAMIN) capsule Take 1 capsule by mouth daily.    [provider]  pantoprazole (PROTONIX) 40 MG tablet Take 1 tablet (40 mg total) by mouth daily. 10/23/17   Quentin Mulling, PA-C  sucralfate (CARAFATE) 1 g tablet Take 1 tablet (1 g total) by mouth 4 (four) times daily -  with meals and at bedtime. 10/23/17 10/23/18  Quentin Mulling, PA-C    Family History Family History  Problem Relation Age of Onset  . Cancer Mother        lyphoma  . Lung disease Mother   . Arthritis Father   . Hypertension Father   . Barrett's esophagus Father   . Melanoma Sister   . Fibromyalgia Sister   . Mental illness Other        emotional illness (other blood relative)  . Stroke Other        Stroke (parent and grandparent)  . Heart disease Other        (grandparent)    . Hypertension Other        (other blood relative)  . Arthritis Other        Parent and grandparent  . Colon cancer Neg Hx   . Rectal cancer Neg Hx   . Stomach cancer Neg Hx   . Esophageal cancer Neg Hx   . Liver cancer Neg Hx     Social History Social History   Tobacco Use  . Smoking status: Never Smoker  . Smokeless tobacco: Never Used  Substance Use Topics  . Alcohol use: Yes    Comment: occasionally  . Drug use: No     Allergies   Patient has no known allergies.   Review of Systems Review of Systems  Constitutional: Negative for activity change and appetite change.  HENT: Positive for sneezing and sore throat. Negative for congestion, postnasal drip, sinus pressure and sinus pain.   Eyes: Negative for discharge and itching.  Respiratory: Positive for cough and shortness of breath. Negative for apnea and wheezing.   Cardiovascular: Negative for chest pain and palpitations.  Endocrine: Negative for cold intolerance.  Genitourinary: Negative for dysuria.  Musculoskeletal: Positive for myalgias. Negative for arthralgias and back pain.  Skin: Negative for rash.  Neurological: Negative for dizziness, weakness, light-headedness and headaches.  Hematological: Negative for adenopathy.     Physical Exam Triage Vital Signs ED Triage Vitals  Enc Vitals Group     BP      Pulse      Resp      Temp      Temp src      SpO2      Weight      Height      Head Circumference      Peak Flow      Pain Score      Pain Loc      Pain Edu?      Excl. in GC?    No data found.  Updated Vital Signs BP (!) 144/82 (BP Location: Right Arm)   Pulse 99   Temp 98.4 F (36.9 C) (Oral)   Resp 18   SpO2 97%   Visual Acuity Right Eye Distance:   Left Eye Distance:   Bilateral Distance:    Right Eye Near:   Left Eye Near:    Bilateral Near:     Physical Exam Vitals signs and nursing note reviewed.  Constitutional:      Appearance: Normal appearance. She is  ill-appearing.  HENT:     Right Ear: Tympanic membrane normal.     Left Ear: Tympanic membrane normal.     Nose: Congestion and rhinorrhea present.     Mouth/Throat:     Mouth: Mucous  membranes are moist.     Pharynx: Posterior oropharyngeal erythema present. No oropharyngeal exudate.  Eyes:     Conjunctiva/sclera: Conjunctivae normal.  Neck:     Musculoskeletal: Normal range of motion. No neck rigidity or muscular tenderness.  Cardiovascular:     Rate and Rhythm: Normal rate and regular rhythm.     Pulses: Normal pulses.     Heart sounds: Normal heart sounds.  Pulmonary:     Effort: Pulmonary effort is normal.     Breath sounds: Normal breath sounds.  Abdominal:     General: Bowel sounds are normal.     Palpations: Abdomen is soft.  Musculoskeletal: Normal range of motion.        General: No swelling or tenderness.  Lymphadenopathy:     Cervical: No cervical adenopathy.  Skin:    General: Skin is warm.     Capillary Refill: Capillary refill takes less than 2 seconds.  Neurological:     General: No focal deficit present.     Mental Status: She is alert and oriented to person, place, and time.      UC Treatments / Results  Labs (all labs ordered are listed, but only abnormal results are displayed) Labs Reviewed  CULTURE, GROUP A STREP [facility])  POCT RAPID STREP A    EKG None  Radiology No results found.  Procedures Procedures (including critical care time)  Medications Ordered in UC Medications - No data to display  Initial Impression / Assessment and Plan / UC Course  I have reviewed the triage vital signs and the nursing notes.  Pertinent labs & imaging results that were available during my care of the patient were reviewed by me and considered in my medical decision making (see chart for details).     1.  Acute viral illness with cough: Guaifenesin with DM Tessalon Perles as needed Tylenol/Motrin for pain/fever Patient has concerns about coronavirus  since she came in from [place], I reassured her that according to the current CDC guidelines the odds of acquiring coronavirus is far less than the odds of acquiring active viral infections.  I offered rapid strep test which was negative.  Her clinical signs and symptoms are inconsistent with influenza.-I counseled her to stick to the symptomatic treatment.  I excused him from work for the next 3 days.  If her clinical condition does not improve she advised to return to urgent care for further evaluation.  Patient and her husband verbalized understanding of the care plan.  Message was left on the preferred phone contact for the patient to pick up some Tessalon Perles at the pharmacy.  Final Clinical Impressions(s) / UC Diagnoses   Final diagnoses:  Nasopharyngitis acute   Discharge Instructions   None    ED Prescriptions    None     Controlled Substance Prescriptions [place] Controlled Substance Registry consulted? No   Merrilee Jansky, MD 04/01/18 2014    Merrilee Jansky, MD 04/01/18 2018

## 2018-04-01 NOTE — Progress Notes (Signed)
Based on what you shared with me, I feel your condition warrants further evaluation and I recommend that you be seen for a face to face office visit.   Ms. Emily Gay,  With your recent travel and thus travel risk,  we must be extra cautious. I recommend having a face to face evaluation to have Flu and Strep testing done to confirm that your current symptoms are not due to either infections. You can have those testing at an urgent care or with your primary care provider.   NOTE: If you entered your credit card information for this eVisit, you will not be charged. You may see a "hold" on your card for the $30 but that hold will drop off and you will not have a charge processed.  If you are having a true medical emergency please call 911.  If you need an urgent face to face visit, [place] has four urgent care centers for your convenience.  If you need care fast and have a high deductible or no insurance consider:   [email or web address] to reserve your spot online an avoid wait times  [facility] [address] [place], [place] [address] 8 am to 8 pm Monday-Friday 10 am to 4 pm Saturday-Sunday *Across the street from [employer]  [address] [place], [address] 8 am to 5 pm Monday-Friday * In the [facility] on the [facility]   The following sites will take your insurance:  . [facility]  [phone] Get Driving Directions Find a Provider at this Location  [address] [place], [place] [address] . 10 am to 8 pm Monday-Friday . 12 pm to 8 pm Saturday-Sunday   . [facility] at [facility]  [phone] Get Driving Directions Find a Provider at this Location  [address], [address] [place], [place] [address] . 8 am to 8 pm Monday-Friday . 9 am to 6 pm Saturday . 11 am to 6 pm Sunday   . [facility] at [facility]  [phone] Get  Driving Directions  [address] [place], [place] [address] . 8 am to 8 pm Monday-Friday . 8 am to 4 pm Saturday-Sunday   Your e-visit answers were reviewed by a board certified advanced clinical practitioner to complete your personal care plan.  Thank you for using e-Visits.   I have spent 7 min to complete this note- [facility]

## 2018-04-01 NOTE — ED Triage Notes (Signed)
Pt here for URI with sore throat

## 2018-04-03 MED ORDER — PREGABALIN 150 MG PO CAPS: 150.0000 mg | ORAL_CAPSULE | Freq: Three times a day (TID) | ORAL | 0 refills | Status: DC

## 2018-04-04 LAB — CULTURE, GROUP A STREP (THRC)

## 2018-04-10 ENCOUNTER — Telehealth: Payer: 59 | Admitting: Family

## 2018-04-10 DIAGNOSIS — R05 Cough: Secondary | ICD-10-CM

## 2018-04-10 DIAGNOSIS — R059 Cough, unspecified: Secondary | ICD-10-CM

## 2018-04-10 MED ORDER — BENZONATATE 100 MG PO CAPS: 100.0000 mg | ORAL_CAPSULE | Freq: Three times a day (TID) | ORAL | 0 refills | Status: DC | PRN

## 2018-04-10 MED ORDER — DOXYCYCLINE HYCLATE 100 MG PO TABS: 100.0000 mg | ORAL_TABLET | Freq: Two times a day (BID) | ORAL | 0 refills | Status: DC

## 2018-04-10 NOTE — Progress Notes (Signed)
We are sorry that you are not feeling well.  Here is how we plan to help!  Based on your presentation I believe you most likely have A cough due to bacteria.  When patients have a fever and a productive cough with a change in color or increased sputum production, we are concerned about bacterial bronchitis.  If left untreated it can progress to pneumonia.  If your symptoms do not improve with your treatment plan it is important that you contact your provider.   I have prescribed Doxycycline 100 mg twice a day for 7 days     In addition you may use A non-prescription cough medication called Robitussin DAC. Take 2 teaspoons every 8 hours or Delsym: take 2 teaspoons every 12 hours., A non-prescription cough medication called Mucinex DM: take 2 tablets every 12 hours. and A prescription cough medication called Tessalon Perles 100mg . You may take 1-2 capsules every 8 hours as needed for your cough.  Given duration I will treat patient. I reviewed Urgent Care notes and past Evisits. She will need to follow up if symptoms worsen or do not improve.   Approximately 5 minutes was spent documenting and reviewing patient's chart.     From your responses in the eVisit questionnaire you describe inflammation in the upper respiratory tract which is causing a significant cough.  This is commonly called Bronchitis and has four common causes:    Allergies  Viral Infections  Acid Reflux  Bacterial Infection Allergies, viruses and acid reflux are treated by controlling symptoms or eliminating the cause. An example might be a cough caused by taking certain blood pressure medications. You stop the cough by changing the medication. Another example might be a cough caused by acid reflux. Controlling the reflux helps control the cough.  USE OF BRONCHODILATOR ("RESCUE") INHALERS: There is a risk from using your bronchodilator too frequently.  The risk is that over-reliance on a medication which only relaxes the  muscles surrounding the breathing tubes can reduce the effectiveness of medications prescribed to reduce swelling and congestion of the tubes themselves.  Although you feel brief relief from the bronchodilator inhaler, your asthma may actually be worsening with the tubes becoming more swollen and filled with mucus.  This can delay other crucial treatments, such as oral steroid medications. If you need to use a bronchodilator inhaler daily, several times per day, you should discuss this with your provider.  There are probably better treatments that could be used to keep your asthma under control.     HOME CARE . Only take medications as instructed by your medical team. . Complete the entire course of an antibiotic. . Drink plenty of fluids and get plenty of rest. . Avoid close contacts especially the very young and the elderly . Cover your mouth if you cough or cough into your sleeve. . Always remember to wash your hands . A steam or ultrasonic humidifier can help congestion.   GET HELP RIGHT AWAY IF: . You develop worsening fever. . You become short of breath . You cough up blood. . Your symptoms persist after you have completed your treatment plan MAKE SURE YOU   Understand these instructions.  Will watch your condition.  Will get help right away if you are not doing well or get worse.  Your e-visit answers were reviewed by a board certified advanced clinical practitioner to complete your personal care plan.  Depending on the condition, your plan could have included both over the counter or  prescription medications. If there is a problem please reply  once you have received a response from your provider. Your safety is important to [place].  If you have drug allergies check your prescription carefully.    You can use MyChart to ask questions about today's visit, request a non-urgent call back, or ask for a work or school excuse for 24 hours related to this e-Visit. If it has been greater than  24 hours you will need to follow up with your provider, or enter a new e-Visit to address those concerns. You will get an e-mail in the next two days asking about your experience.  I hope that your e-visit has been valuable and will speed your recovery. Thank you for using e-visits.

## 2018-05-01 NOTE — Progress Notes (Signed)
THIS ENCOUNTER IS A VIRTUAL/TELEPHONE VISIT DUE TO COVID-19 - PATIENT WAS NOT SEEN IN THE OFFICE.  PATIENT HAS CONSENTED TO VIRTUAL VISIT / TELEMEDICINE VISIT  This provider placed a call to Emily Gay using telephone, her appointment was changed to a virtual office visit to reduce the risk of exposure to the COVID-19 virus and to help@ remain healthy and safe. The virtual visit will also provide continuity of care. She verbalizes understanding.   3 MONTH FOLLOW UP Assessment and Plan: Essential hypertension - continue medications, DASH diet, exercise and monitor at home. Call if greater than 130/80.   Abnormal glucose Discussed general issues about diabetes pathophysiology and management., Educational material distributed., Suggested low cholesterol diet.,   Morbid obesity, unspecified obesity type (HCC) Obesity with co morbidities- long discussion about weight loss, diet, and exercise   Hyperlipidemia -continue medications, check lipids, decrease fatty foods, increase activity.   Nonintractable headache, unspecified chronicity pattern, unspecified headache type Continue lyrica Will do imitrex as needed for possible migraines  WILL CHECK LABS IN OCT Discussed med's effects and SE's. Screening labs and tests as requested with regular follow-up as recommended. Future Appointments  Date Time Provider Department Center  10/24/2018  2:00 PM Quentin Mullingollier, Crews Mccollam, PA-C [facility] None    HPI  48 y.o. female  presents for a follow up  Her blood pressure IS NOT CHECKED AT HOME.  She does not workout due to chronic pain. She denies chest pain, shortness of breath, dizziness.   BMI is Body mass index is 48.26 kg/m. She could not tolerate wellbutrin. Has new job and states her stress is better.   She is morbidly obese, had history of + OSA but unable to tolerate CPAP, last sleep study 2015.  Has seen neurology in the past for episodes of memory confusion, nerve pain randomly, and  fasciculations only thing that helps is sleep , Dr. Anne HahnWillis in 2013, lyrica has helped but she states it is getting worse, now 2-3 x a month, worse with less sleep, stress, during menses. She has been having more headaches and migraines. Has had normal EEG, normal MRI.  She has had some visual auras and now believes it has been migraines, declines daily med, will try imitrex for as needed.   Wt Readings from Last 3 Encounters:  05/02/18 290 lb (131.5 kg)  10/23/17 299 lb 9.6 oz (135.9 kg)  08/01/16 294 lb 12.8 oz (133.7 kg)   She is not on cholesterol medication and denies myalgias. Her cholesterol is at goal. The cholesterol last visit was:   Lab Results  Component Value Date   CHOL 183 10/23/2017   HDL 43 (L) 10/23/2017   LDLCALC 118 (H) 10/23/2017   LDLDIRECT 134.4 07/26/2011   TRIG 114 10/23/2017   CHOLHDL 4.3 10/23/2017    She has been working on diet and exercise for prediabetes, and denies polydipsia, polyuria and visual disturbances. Last A1C in the office was:  Lab Results  Component Value Date   HGBA1C 6.0 (H) 10/23/2017   Patient is on Vitamin D supplement, 5000 IU daily.   Lab Results  Component Value Date   VD25OH 33 10/23/2017     She had L4-L5 fusion with Dr. Yevette Edwardsumonski in Sept 2016.   She has GERD and had EGD 06/06/2016 and MRCP 06/06/2016, on protonix and better controlled.   Current Medications:  Current Outpatient Medications on File Prior to Visit  Medication Sig Dispense Refill  . benzonatate (TESSALON PERLES) 100 MG capsule Take 1 capsule (  100 mg total) by mouth 3 (three) times daily as needed. 20 capsule 0  . Cholecalciferol (VITAMIN D PO) Take 2,000 Units by mouth daily.     . fluticasone (FLONASE) 50 MCG/ACT nasal spray Place 2 sprays into both nostrils at bedtime. 16 g 3  . levocetirizine (XYZAL) 5 MG tablet Take 1 tablet (5 mg total) by mouth daily. 30 tablet 3  . methocarbamol (ROBAXIN) 500 MG tablet TAKE 1 TABLET (500 MG TOTAL) BY MOUTH 3 (THREE) TIMES  DAILY AS NEEDED FOR MUSCLE SPASMS. 90 tablet 1  . Multiple Vitamin (MULTIVITAMIN) capsule Take 1 capsule by mouth daily.    . pantoprazole (PROTONIX) 40 MG tablet Take 1 tablet (40 mg total) by mouth daily. 90 tablet 4  . pregabalin (LYRICA) 150 MG capsule Take 1 capsule (150 mg total) by mouth 3 (three) times daily. 270 capsule 0  . sucralfate (CARAFATE) 1 g tablet Take 1 tablet (1 g total) by mouth 4 (four) times daily -  with meals and at bedtime. 120 tablet 2   No current facility-administered medications on file prior to visit.    Medical History:  Past Medical History:  Diagnosis Date  . Allergic rhinitis   . Anemia   . Arthritis   . Chronic low back pain   . GERD (gastroesophageal reflux disease)   . Headache    migraine  . Hypertension   . 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 Obesity   . OSA on CPAP    does not use CPAP   Allergies No Known Allergies  SURGICAL HISTORY She  has a past surgical history that includes Cholecystectomy (Feb. 2011); Anterior lat lumbar fusion (N/A, 10/22/2014); and Esophagogastroduodenoscopy (egd) with propofol (N/A, 06/09/2016). FAMILY HISTORY Her family history includes Arthritis in her father and another family member; Barrett's esophagus in her father; Cancer in her mother; Fibromyalgia in her sister; Heart disease in an other family member; Hypertension in her father and another family member; Lung disease in her mother; Melanoma in her sister; Mental illness in an other family member; Stroke in an other family member. SOCIAL HISTORY She  reports that she has never smoked. She has never used smokeless tobacco. She reports current alcohol use. She reports that she does not use drugs.   Review of Systems:  Review of Systems  Constitutional: Negative.   HENT: Negative for congestion, ear discharge, ear pain, hearing loss, nosebleeds, sore throat and tinnitus.   Eyes: Negative.   Respiratory: Negative.  Negative for stridor.   Cardiovascular:  Negative.   Gastrointestinal: Negative for abdominal pain, constipation, diarrhea, heartburn and nausea.  Genitourinary: Negative.   Musculoskeletal: Negative for back pain, falls, joint pain, myalgias and neck pain.  Skin: Negative.   Neurological: Headaches:  TMJ.  Endo/Heme/Allergies: Negative.   Psychiatric/Behavioral: Negative.      Physical Exam: Estimated body mass index is 48.26 kg/m as calculated from the following:   Height as of 10/23/17: 5\' 5"  (1.651 m).   Weight as of this encounter: 290 lb (131.5 kg). Wt 290 lb (131.5 kg)   BMI 48.26 kg/m  General Appearance:Well sounding, in no apparent distress.  ENT/Mouth: No hoarseness, No cough for duration of visit.  Respiratory: completing full sentences without distress, without audible wheeze Neuro: Awake and oriented X 3,  Psych:  Insight and Judgment appropriate.   Quentin Mulling 10:44 AM [facility]

## 2018-05-02 ENCOUNTER — Ambulatory Visit: Payer: 59 | Admitting: Physician Assistant

## 2018-05-02 ENCOUNTER — Other Ambulatory Visit: Payer: Self-pay

## 2018-05-02 VITALS — Wt 290.0 lb

## 2018-05-02 DIAGNOSIS — I10 Essential (primary) hypertension: Secondary | ICD-10-CM

## 2018-05-02 DIAGNOSIS — R7309 Other abnormal glucose: Secondary | ICD-10-CM

## 2018-05-02 DIAGNOSIS — E782 Mixed hyperlipidemia: Secondary | ICD-10-CM

## 2018-05-02 MED ORDER — SUMATRIPTAN SUCCINATE 50 MG PO TABS: 50.0000 mg | ORAL_TABLET | Freq: Once | ORAL | 2 refills | Status: DC | PRN

## 2018-05-02 NOTE — Patient Instructions (Signed)
Sumatriptan tablets  What is this medicine?  SUMATRIPTAN (soo ma TRIP tan) is used to treat migraines with or without aura. An aura is a strange feeling or visual disturbance that warns you of an attack. It is not used to prevent migraines.  This medicine may be used for other purposes; ask your health care provider or pharmacist if you have questions.  COMMON BRAND NAME(S): Imitrex, Migraine Pack  What should I tell my health care provider before I take this medicine?  They need to know if you have any of these conditions:  -cigarette smoker  -circulation problems in fingers and toes  -diabetes  -heart disease  -high blood pressure  -high cholesterol  -history of irregular heartbeat  -history of stroke  -kidney disease  -liver disease  -stomach or intestine problems  -an unusual or allergic reaction to sumatriptan, other medicines, foods, dyes, or preservatives  -pregnant or trying to get pregnant  -breast-feeding  How should I use this medicine?  Take this medicine by mouth with a glass of water. Follow the directions on the prescription label. Do not take it more often than directed.  Talk to your pediatrician regarding the use of this medicine in children. Special care may be needed.  Overdosage: If you think you have taken too much of this medicine contact a poison control center or emergency room at once.  NOTE: This medicine is only for you. Do not share this medicine with others.  What if I miss a dose?  This does not apply. This medicine is not for regular use.  What may interact with this medicine?  Do not take this medicine with any of the following medicines:  -certain medicines for migraine headache like almotriptan, eletriptan, frovatriptan, naratriptan, rizatriptan, sumatriptan, zolmitriptan  -ergot alkaloids like dihydroergotamine, ergonovine, ergotamine, methylergonovine  -MAOIs like Carbex, Eldepryl, Marplan, Nardil, and Parnate  This medicine may also interact with the following  medications:  -certain medicines for depression, anxiety, or psychotic disorders  This list may not describe all possible interactions. Give your health care provider a list of all the medicines, herbs, non-prescription drugs, or dietary supplements you use. Also tell them if you smoke, drink alcohol, or use illegal drugs. Some items may interact with your medicine.  What should I watch for while using this medicine?  Visit your healthcare professional for regular checks on your progress. Tell your healthcare professional if your symptoms do not start to get better or if they get worse.  You may get drowsy or dizzy. Do not drive, use machinery, or do anything that needs mental alertness until you know how this medicine affects you. Do not stand up or sit up quickly, especially if you are an older patient. This reduces the risk of dizzy or fainting spells. Alcohol may interfere with the effect of this medicine.  Tell your healthcare professional right away if you have any change in your eyesight.  If you take migraine medicines for 10 or more days a month, your migraines may get worse. Keep a diary of headache days and medicine use. Contact your healthcare professional if your migraine attacks occur more frequently.  What side effects may I notice from receiving this medicine?  Side effects that you should report to your doctor or health care professional as soon as possible:  -allergic reactions like skin rash, itching or hives, swelling of the face, lips, or tongue  -changes in vision  -chest pain or chest tightness  -signs and symptoms of a   dangerous change in heartbeat or heart rhythm like chest pain; dizziness; fast, irregular heartbeat; palpitations; feeling faint or lightheaded; falls; breathing problems  -signs and symptoms of a stroke like changes in vision; confusion; trouble speaking or understanding; severe headaches; sudden numbness or weakness of the face, arm or leg; trouble walking; dizziness; loss of  balance or coordination  -signs and symptoms of serotonin syndrome like irritable; confusion; diarrhea; fast or irregular heartbeat; muscle twitching; stiff muscles; trouble walking; sweating; high fever; seizures; chills; vomiting  Side effects that usually do not require medical attention (report to your doctor or health care professional if they continue or are bothersome):  -diarrhea  -dizziness  -drowsiness  -dry mouth  -headache  -nausea, vomiting  -pain, tingling, numbness in the hands or feet  -stomach pain  This list may not describe all possible side effects. Call your doctor for medical advice about side effects. You may report side effects to FDA at 1-800-FDA-1088.  Where should I keep my medicine?  Keep out of the reach of children.  Store at room temperature between 2 and 30 degrees C (36 and 86 degrees F). Throw away any unused medicine after the expiration date.  NOTE: This sheet is a summary. It may not cover all possible information. If you have questions about this medicine, talk to your doctor, pharmacist, or health care provider.   2019 Elsevier/Gold Standard (2017-07-24 15:05:37)

## 2018-07-17 ENCOUNTER — Other Ambulatory Visit: Payer: Self-pay | Admitting: Physician Assistant

## 2018-08-10 ENCOUNTER — Telehealth: Payer: 59 | Admitting: Physician Assistant

## 2018-08-10 DIAGNOSIS — R300 Dysuria: Secondary | ICD-10-CM | POA: Diagnosis not present

## 2018-08-10 MED ORDER — CEPHALEXIN 500 MG PO CAPS: 500.0000 mg | ORAL_CAPSULE | Freq: Two times a day (BID) | ORAL | 0 refills | Status: AC

## 2018-08-10 NOTE — Progress Notes (Signed)
I have spent 5 minutes in review of e-visit questionnaire, review and updating patient chart, medical decision making and response to patient.   Keion Neels Cody Bill Mcvey, PA-C    

## 2018-08-10 NOTE — Progress Notes (Signed)

## 2018-09-19 ENCOUNTER — Other Ambulatory Visit: Payer: Self-pay | Admitting: Physician Assistant

## 2018-09-19 NOTE — Telephone Encounter (Signed)
Recent Visits Date Type Provider Dept  05/02/18 Office Visit Vicie Mutters, PA-C [facility]  10/23/17 Office Visit Vicie Mutters, PA-C [facility] recent visits within past 540 days with a meds authorizing provider and meeting all other requirements   Future Appointments Date Type Provider Dept  10/24/18 Appointment Vicie Mutters, PA-C [facility]  Showing future appointments within next 150 days with a meds authorizing provider and meeting all other requirements

## 2018-10-22 NOTE — Progress Notes (Signed)
Complete Physical  Assessment and Plan:  Encounter for general adult medical examination with abnormal findings OVERDUE MGM, given number- will call  Morbid obesity (BMI 50) - follow up 3 months for progress monitoring - increase veggies, decrease carbs - long discussion about weight loss, diet, and exercise - stop any drinks with calories, aim for 80-100 oz of water  OSA (obstructive sleep apnea) -     Ambulatory referral to Sleep Studies ? Contributing to migraines and fasiculations  Anemia, unspecified type -     Iron,Total/Total Iron Binding Cap -     Ferritin -     Vitamin B12  Nonintractable headache, unspecified chronicity pattern, unspecified headache type Continue lyrica Normal neuro in office Will try trial of emgality for possible migraines as a cause, if it does not help follow up neuro ? Need cpap- will refer back to neuro for sleep study has been 5 years -     Rimegepant Sulfate (NURTEC) 75 MG TBDP; Take 75 mg by mouth as needed (migraine). -     Galcanezumab-gnlm (EMGALITY) 120 MG/ML SOAJ; Inject 120 mg into the skin every 30 (thirty) days. -     Rimegepant Sulfate (NURTEC) 75 MG TBDP; Take 75 mg by mouth as needed (migraine).  Flu vaccine need -     FLU VACCINE MDCK QUAD W/Preservative  Medication management -     Magnesium  Frequent fasciculation of limb muscle -     Magnesium -     Ambulatory referral to Sleep Studies -     CK -? Some shaking of her hands in the office, with and without intention but not very impressive.  ?From migraines, OSA, has seen Dr. Anne Hahn in the past.  - follow up neuro  Essential hypertension - continue medications, DASH diet, exercise and monitor at home. Call if greater than 130/80.  - CBC with Differential/Platelet - BASIC METABOLIC PANEL WITH GFR - Hepatic function panel - TSH - Urinalysis, Routine w reflex microscopic (not at [facility]) - Microalbumin / creatinine urine ratio - EKG 12-Lead  Abnormal glucose Discussed  general issues about diabetes pathophysiology and management., Educational material distributed., Suggested low cholesterol diet., - Hemoglobin A1c   Hyperlipidemia -continue medications, check lipids, decrease fatty foods, increase activity.  - Lipid panel  Vitamin D deficiency - VITAMIN D 25 Hydroxy (Vit-D Deficiency, Fractures)  Encounter for long-term (current) use of medications - Magnesium   Nocturnal myoclonus Better with lyrica but starting again Will try trial of emgality for possible migraines as a cause, if it does not help follow up neuro  Arthralgia of temporomandibular joint, unspecified laterality monitor   GERD Continue PPI/H2 blocker, diet discussed   Discussed med's effects and SE's. Screening labs and tests as requested with regular follow-up as recommended. Future Appointments  Date Time Provider Department Center  10/27/2019  2:00 PM Quentin Mulling, PA-C [facility] None    HPI  48 y.o. female  presents for a complete physical.  Her blood pressure has been controlled at home, today their BP is BP: 140/80 She does not workout due to chronic pain. She denies chest pain, shortness of breath, dizziness.   BMI is Body mass index is 51.67 kg/m., she is working on diet and exercise.  History of + OSA but unable to tolerate CPAP, last sleep study 2015. She tried wellbutrin in the past without help.   Wt Readings from Last 3 Encounters:  10/24/18 (!) 301 lb (136.5 kg)  05/02/18 290 lb (131.5 kg)  10/23/17  299 lb 9.6 oz (135.9 kg)   She had L4-L5 fusion with Dr. Yevette Edwardsumonski in Sept 2016.   She has GERD and had EGD 06/06/2016 and MRCP 06/06/2016.   Has seen neurology in the past for episodes of memory confusion, nerve pain randomly, and fasciculations only thing that helps is sleep , Dr. Anne HahnWillis in 2013, lyrica has helped some however she continues to have some worsening fasciculations/muscle tremors, states you can see the shaking the most in her hands, feels it  in her extremities the most but can be abdomen as well.  Has noticed it more during the day and not just at night, compared to previous years.  Notices the tremors more after a migraine, will last 3 days.  She has been having more headaches and migraines, 3-4 x a month.  He has OSA, not on CPAP.   She is not on cholesterol medication and denies myalgias. Her cholesterol is not at goal, less than 100.  The cholesterol last visit was:   Lab Results  Component Value Date   CHOL 183 10/23/2017   HDL 43 (L) 10/23/2017   LDLCALC 118 (H) 10/23/2017   LDLDIRECT 134.4 07/26/2011   TRIG 114 10/23/2017   CHOLHDL 4.3 10/23/2017    She has been working on diet and exercise for prediabetes, and denies polydipsia, polyuria and visual disturbances. Last A1C in the office was:  Lab Results  Component Value Date   HGBA1C 6.0 (H) 10/23/2017   Patient is on Vitamin D supplement, 5000 IU daily.   Lab Results  Component Value Date   VD25OH 33 10/23/2017      Current Medications:  Current Outpatient Medications on File Prior to Visit  Medication Sig Dispense Refill  . benzonatate (TESSALON PERLES) 100 MG capsule Take 1 capsule (100 mg total) by mouth 3 (three) times daily as needed. 20 capsule 0  . Cholecalciferol (VITAMIN D PO) Take 2,000 Units by mouth daily.     . methocarbamol (ROBAXIN) 500 MG tablet TAKE 1 TABLET (500 MG TOTAL) BY MOUTH 3 (THREE) TIMES DAILY AS NEEDED FOR MUSCLE SPASMS. 90 tablet 1  . Multiple Vitamin (MULTIVITAMIN) capsule Take 1 capsule by mouth daily.    . pantoprazole (PROTONIX) 40 MG tablet Take 1 tablet (40 mg total) by mouth daily. 90 tablet 4  . pregabalin (LYRICA) 150 MG capsule Take 1 capsule 3 x/day for pain 270 capsule 1  . sucralfate (CARAFATE) 1 g tablet Take 1 tablet (1 g total) by mouth 4 (four) times daily -  with meals and at bedtime. 120 tablet 2   No current facility-administered medications on file prior to visit.    Health Maintenance:   Immunization  History  Administered Date(s) Administered  . Influenza Split 11/13/2011, 11/13/2013  . Influenza Whole 11/25/2008  . Influenza, Seasonal, Injecte, Preservative Fre 12/09/2015  . Td 01/23/1990  . Tdap 08/21/2012   Tetanus: 2014 Pneumovax:  N/A Prevnar 13: N/A Flu vaccine: 2020 Zostavax: N/A  No LMP recorded. Pap: 10/2017  MGM: 08/2012  DUE DEXA: N/A Colonoscopy: N/a EGD: N/A Last Eye Exam: Dr. Shea Evansunn Has seen Dr. Juanetta GoslingHawkins  Patient Care Team: Lucky CowboyMcKeown, William, MD as PCP - General (Internal Medicine) Estill Bambergumonski, Mark, MD as Consulting Physician (Orthopedic Surgery) York SpanielWillis, Charles K, MD as Consulting Physician (Neurology)  Medical History:  Past Medical History:  Diagnosis Date  . Allergic rhinitis   . Anemia   . Arthritis   . Chronic low back pain   . GERD (gastroesophageal reflux disease)   .  Headache    migraine  . Hypertension   . 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 Obesity   . OSA on CPAP    does not use CPAP   Allergies No Known Allergies  SURGICAL HISTORY She  has a past surgical history that includes Cholecystectomy (Feb. 2011); Anterior lat lumbar fusion (N/A, 10/22/2014); and Esophagogastroduodenoscopy (egd) with propofol (N/A, 06/09/2016). FAMILY HISTORY Her family history includes Arthritis in her father and another family member; Barrett's esophagus in her father; Cancer in her mother; Fibromyalgia in her sister; Heart disease in an other family member; Hypertension in her father and another family member; Lung disease in her mother; Melanoma in her sister; Mental illness in an other family member; Stroke in an other family member. SOCIAL HISTORY She  reports that she has never smoked. She has never used smokeless tobacco. She reports current alcohol use. She reports that she does not use drugs.   Review of Systems:  Review of Systems  Constitutional: Negative.   HENT: Negative for congestion, ear discharge, ear pain, hearing loss, nosebleeds, sore throat  and tinnitus.   Eyes: Negative.   Respiratory: Negative.  Negative for stridor.   Cardiovascular: Negative.   Gastrointestinal: Negative for abdominal pain, constipation, diarrhea, heartburn and nausea.  Genitourinary: Negative.   Musculoskeletal: Positive for back pain. Negative for falls, joint pain, myalgias and neck pain.  Skin: Negative.   Neurological: Headaches:  TMJ.  Endo/Heme/Allergies: Negative.   Psychiatric/Behavioral: Negative.      Physical Exam: Estimated body mass index is 51.67 kg/m as calculated from the following:   Height as of this encounter: 5\' 4"  (1.626 m).   Weight as of this encounter: 301 lb (136.5 kg). BP 140/80   Pulse 91   Temp 97.8 F (36.6 C)   Ht 5\' 4"  (1.626 m)   Wt (!) 301 lb (136.5 kg)   SpO2 98%   BMI 51.67 kg/m  General Appearance: Well nourished, in no apparent distress.  Eyes: PERRLA, EOMs, conjunctiva no swelling or erythema, normal fundi and vessels.  Sinuses: No Frontal/maxillary tenderness  ENT/Mouth: Ext aud canals clear, normal light reflex with TMs without erythema, bulging. Good dentition. No erythema, swelling, or exudate on post pharynx. Tonsils not swollen or erythematous. Hearing normal.  Neck: Supple, thyroid normal. No bruits  Respiratory: Respiratory effort normal, BS equal bilaterally without rales, rhonchi, wheezing or stridor.  Cardio: RRR without murmurs, rubs or gallops. Brisk peripheral pulses with 1+edema.  Chest: symmetric, with normal excursions and percussion.  Breasts: Symmetric, without lumps, nipple discharge, retractions.  Abdomen: Soft, nontender, obese no guarding, rebound, hernias, masses, or organomegaly. .  Lymphatics: Non tender without lymphadenopathy.  Genitourinary: defer Musculoskeletal: Full ROM all peripheral extremities,5/5 strength.  Skin: Warm, dry without rashes, lesions, ecchymosis. Neuro: Cranial nerves intact, reflexes equal bilaterally. Normal muscle tone, no cerebellar symptoms.  Slightly tremor bilateral hands while at rest.  Psych: Awake and oriented X 3, normal affect, Insight and Judgment appropriate.   EKG: WNL no changes.   Vicie Mutters 2:10 PM [facility]

## 2018-10-24 ENCOUNTER — Ambulatory Visit (INDEPENDENT_AMBULATORY_CARE_PROVIDER_SITE_OTHER): Payer: 59 | Admitting: Physician Assistant

## 2018-10-24 ENCOUNTER — Encounter: Payer: Self-pay | Admitting: Physician Assistant

## 2018-10-24 ENCOUNTER — Other Ambulatory Visit: Payer: Self-pay

## 2018-10-24 VITALS — BP 140/80 | HR 91 | Temp 97.8°F | Ht 64.0 in | Wt 301.0 lb

## 2018-10-24 DIAGNOSIS — M5410 Radiculopathy, site unspecified: Secondary | ICD-10-CM

## 2018-10-24 DIAGNOSIS — K2100 Gastro-esophageal reflux disease with esophagitis, without bleeding: Secondary | ICD-10-CM

## 2018-10-24 DIAGNOSIS — M26629 Arthralgia of temporomandibular joint, unspecified side: Secondary | ICD-10-CM

## 2018-10-24 DIAGNOSIS — E559 Vitamin D deficiency, unspecified: Secondary | ICD-10-CM

## 2018-10-24 DIAGNOSIS — R519 Headache, unspecified: Secondary | ICD-10-CM

## 2018-10-24 DIAGNOSIS — R253 Fasciculation: Secondary | ICD-10-CM

## 2018-10-24 DIAGNOSIS — Z136 Encounter for screening for cardiovascular disorders: Secondary | ICD-10-CM | POA: Diagnosis not present

## 2018-10-24 DIAGNOSIS — E782 Mixed hyperlipidemia: Secondary | ICD-10-CM

## 2018-10-24 DIAGNOSIS — G4733 Obstructive sleep apnea (adult) (pediatric): Secondary | ICD-10-CM

## 2018-10-24 DIAGNOSIS — Z79899 Other long term (current) drug therapy: Secondary | ICD-10-CM

## 2018-10-24 DIAGNOSIS — I10 Essential (primary) hypertension: Secondary | ICD-10-CM

## 2018-10-24 DIAGNOSIS — Z0000 Encounter for general adult medical examination without abnormal findings: Secondary | ICD-10-CM | POA: Diagnosis not present

## 2018-10-24 DIAGNOSIS — R7309 Other abnormal glucose: Secondary | ICD-10-CM

## 2018-10-24 DIAGNOSIS — D649 Anemia, unspecified: Secondary | ICD-10-CM

## 2018-10-24 DIAGNOSIS — Z0001 Encounter for general adult medical examination with abnormal findings: Secondary | ICD-10-CM

## 2018-10-24 DIAGNOSIS — G4769 Other sleep related movement disorders: Secondary | ICD-10-CM

## 2018-10-24 DIAGNOSIS — Z23 Encounter for immunization: Secondary | ICD-10-CM | POA: Diagnosis not present

## 2018-10-24 MED ORDER — LEVOCETIRIZINE DIHYDROCHLORIDE 5 MG PO TABS: 5.0000 mg | ORAL_TABLET | Freq: Every day | ORAL | 1 refills | Status: AC

## 2018-10-24 MED ORDER — EMGALITY 120 MG/ML ~~LOC~~ SOAJ: 120.0000 mg | SUBCUTANEOUS | 6 refills | Status: DC

## 2018-10-24 MED ORDER — NURTEC 75 MG PO TBDP: 75.0000 mg | ORAL_TABLET | ORAL | 0 refills | Status: DC | PRN

## 2018-10-24 MED ORDER — FLUTICASONE PROPIONATE 50 MCG/ACT NA SUSP: 2.0000 | Freq: Every day | NASAL | 3 refills | Status: AC

## 2018-10-24 MED ORDER — SUMATRIPTAN SUCCINATE 50 MG PO TABS: ORAL_TABLET | ORAL | 3 refills | Status: DC

## 2018-10-24 NOTE — Patient Instructions (Addendum)
HOW TO SCHEDULE A MAMMOGRAM  [facility]  7 a.m.-6:30 p.m., Monday 7 a.m.-5 p.m., Tuesday-Friday Schedule an appointment by calling [phone].  I want to challenge you to lose 10 lbs before the next appointment.  That would be about 3 lbs a month!  This is very achievable and I know you can do it.  10 lbs is actually 40 lbs of pressure of your joints, back, hips, knees and ankles.  10lbs may be enough to help your blood pressure and cholesterol for next visit.      When it comes to diets, agreement about the perfect plan isn't easy to find, even among the experts. Experts at the [employer] developed an idea known as the Healthy Eating Plate. Just imagine a plate divided into logical, healthy portions.  The emphasis is on diet quality:  Load up on vegetables and fruits - one-half of your plate: Aim for color and variety, and remember that potatoes don't count.  Go for whole grains - one-quarter of your plate: Whole wheat, barley, wheat berries, quinoa, oats, brown rice, and foods made with them. If you want pasta, go with whole wheat pasta.  Protein power - one-quarter of your plate: Fish, chicken, beans, and nuts are all healthy, versatile protein sources. Limit red meat.  The diet, however, does go beyond the plate, offering a few other suggestions.  Use healthy plant oils, such as olive, canola, soy, corn, sunflower and peanut. Check the labels, and avoid partially hydrogenated oil, which have unhealthy trans fats.  If you're thirsty, drink water. Coffee and tea are good in moderation, but skip sugary drinks and limit milk and dairy products to one or two daily servings.  The type of carbohydrate in the diet is more important than the amount. Some sources of carbohydrates, such as vegetables, fruits, whole grains, and beans-are healthier than others.  Finally, stay active.  Galcanezumab injection What is this medicine?  GALCANEZUMAB (gal ka NEZ ue mab) is used to prevent migraines and treat cluster headaches. This medicine may be used for other purposes; ask your health care provider or pharmacist if you have questions. COMMON BRAND NAME(S): Emgality What should I tell my health care provider before I take this medicine? They need to know if you have any of these conditions:  an unusual or allergic reaction to galcanezumab, other medicines, foods, dyes, or preservatives  pregnant or trying to get pregnant  breast-feeding How should I use this medicine? This medicine is for injection under the skin. You will be taught how to prepare and give this medicine. Use exactly as directed. Take your medicine at regular intervals. Do not take your medicine more often than directed. It is important that you put your used needles and syringes in a special sharps container. Do not put them in a trash can. If you do not have a sharps container, call your pharmacist or healthcare provider to get one. Talk to your pediatrician regarding the use of this medicine in children. Special care may be needed. Overdosage: If you think you have taken too much of this medicine contact a poison control center or emergency room at once. NOTE: This medicine is only for you. Do not share this medicine with others. What if I miss a dose? If you miss a dose, take it as soon as you can. If it is almost time for your next dose, take only that dose. Do not take double or extra  doses. What may interact with this medicine? Interactions are not expected. This list may not describe all possible interactions. Give your health care provider a list of all the medicines, herbs, non-prescription drugs, or dietary supplements you use. Also tell them if you smoke, drink alcohol, or use illegal drugs. Some items may interact with your medicine. What should I watch for while using this medicine? Tell your doctor or healthcare professional if your symptoms  do not start to get better or if they get worse. What side effects may I notice from receiving this medicine? Side effects that you should report to your doctor or health care professional as soon as possible:  allergic reactions like skin rash, itching or hives, swelling of the face, lips, or tongue Side effects that usually do not require medical attention (report these to your doctor or health care professional if they continue or are bothersome):  pain, redness, or irritation at site where injected This list may not describe all possible side effects. Call your doctor for medical advice about side effects. You may report side effects to FDA at 1-800-FDA-1088. Where should I keep my medicine? Keep out of the reach of children. You will be instructed on how to store this medicine. Throw away any unused medicine after the expiration date on the label. NOTE: This sheet is a summary. It may not cover all possible information. If you have questions about this medicine, talk to your doctor, pharmacist, or health care provider.  2020 Elsevier/Gold Standard (2017-06-27 12:03:23)

## 2018-10-25 LAB — COMPLETE METABOLIC PANEL WITH GFR
AG Ratio: 1.4 (calc) (ref 1.0–2.5)
ALT: 12 U/L (ref 6–29)
AST: 11 U/L (ref 10–35)
Albumin: 4.2 g/dL (ref 3.6–5.1)
Alkaline phosphatase (APISO): 79 U/L (ref 31–125)
BUN: 11 mg/dL (ref 7–25)
CO2: 26 mmol/L (ref 20–32)
Calcium: 9.5 mg/dL (ref 8.6–10.2)
Chloride: 103 mmol/L (ref 98–110)
Creat: 0.6 mg/dL (ref 0.50–1.10)
GFR, Est African American: 127 mL/min/{1.73_m2} (ref >=60)
GFR, Est Non African American: 109 mL/min/{1.73_m2} (ref >=60)
Globulin: 2.9 g/dL (calc) (ref 1.9–3.7)
Glucose, Bld: 92 mg/dL (ref 65–99)
Potassium: 4.2 mmol/L (ref 3.5–5.3)
Sodium: 138 mmol/L (ref 135–146)
Total Bilirubin: 0.3 mg/dL (ref 0.2–1.2)
Total Protein: 7.1 g/dL (ref 6.1–8.1)

## 2018-10-25 LAB — MICROALBUMIN / CREATININE URINE RATIO
Creatinine, Urine: 69 mg/dL (ref 20–275)
Microalb Creat Ratio: 7 mcg/mg creat (ref <30)
Microalb, Ur: 0.5 mg/dL

## 2018-10-25 LAB — LIPID PANEL
Cholesterol: 175 mg/dL (ref <200)
HDL: 48 mg/dL — ABNORMAL LOW (ref >=50)
LDL Cholesterol (Calc): 110 mg/dL (calc) — ABNORMAL HIGH
Non-HDL Cholesterol (Calc): 127 mg/dL (calc) (ref <130)
Total CHOL/HDL Ratio: 3.6 (calc) (ref <5.0)
Triglycerides: 80 mg/dL (ref <150)

## 2018-10-25 LAB — CBC WITH DIFFERENTIAL/PLATELET
Absolute Monocytes: 686 cells/uL (ref 200–950)
Basophils Absolute: 64 cells/uL (ref 0–200)
Basophils Relative: 0.5 %
Eosinophils Absolute: 241 cells/uL (ref 15–500)
Eosinophils Relative: 1.9 %
HCT: 36.8 % (ref 35.0–45.0)
Hemoglobin: 12 g/dL (ref 11.7–15.5)
Lymphs Abs: 2565 cells/uL (ref 850–3900)
MCH: 26.5 pg — ABNORMAL LOW (ref 27.0–33.0)
MCHC: 32.6 g/dL (ref 32.0–36.0)
MCV: 81.2 fL (ref 80.0–100.0)
MPV: 10 fL (ref 7.5–12.5)
Monocytes Relative: 5.4 %
Neutro Abs: 9144 cells/uL — ABNORMAL HIGH (ref 1500–7800)
Neutrophils Relative %: 72 %
Platelets: 447 10*3/uL — ABNORMAL HIGH (ref 140–400)
RBC: 4.53 10*6/uL (ref 3.80–5.10)
RDW: 14 % (ref 11.0–15.0)
Total Lymphocyte: 20.2 %
WBC: 12.7 10*3/uL — ABNORMAL HIGH (ref 3.8–10.8)

## 2018-10-25 LAB — URINALYSIS, ROUTINE W REFLEX MICROSCOPIC
Bilirubin Urine: NEGATIVE
Glucose, UA: NEGATIVE
Hgb urine dipstick: NEGATIVE
Ketones, ur: NEGATIVE
Leukocytes,Ua: NEGATIVE
Nitrite: NEGATIVE
Protein, ur: NEGATIVE
Specific Gravity, Urine: 1.014 (ref 1.001–1.03)
pH: 5.5 (ref 5.0–8.0)

## 2018-10-25 LAB — VITAMIN B12: Vitamin B-12: 363 pg/mL (ref 200–1100)

## 2018-10-25 LAB — IRON, TOTAL/TOTAL IRON BINDING CAP
%SAT: 11 % (calc) — ABNORMAL LOW (ref 16–45)
Iron: 34 ug/dL — ABNORMAL LOW (ref 40–190)
TIBC: 304 mcg/dL (calc) (ref 250–450)

## 2018-10-25 LAB — HEMOGLOBIN A1C
Hgb A1c MFr Bld: 5.9 % of total Hgb — ABNORMAL HIGH (ref <5.7)
Mean Plasma Glucose: 123 (calc)
eAG (mmol/L): 6.8 (calc)

## 2018-10-25 LAB — TSH: TSH: 1.97 mIU/L

## 2018-10-25 LAB — FERRITIN: Ferritin: 109 ng/mL (ref 16–232)

## 2018-10-25 LAB — VITAMIN D 25 HYDROXY (VIT D DEFICIENCY, FRACTURES): Vit D, 25-Hydroxy: 33 ng/mL (ref 30–100)

## 2018-10-25 LAB — MAGNESIUM: Magnesium: 2.1 mg/dL (ref 1.5–2.5)

## 2018-10-25 LAB — CK: Total CK: 57 U/L (ref 29–143)

## 2018-10-28 LAB — VITAMIN B12

## 2018-10-28 LAB — CBC WITH DIFFERENTIAL/PLATELET

## 2018-10-28 LAB — URINALYSIS, ROUTINE W REFLEX MICROSCOPIC

## 2018-10-28 LAB — LIPID PANEL

## 2018-10-28 LAB — COMPLETE METABOLIC PANEL WITH GFR

## 2018-10-28 LAB — HEMOGLOBIN A1C

## 2018-10-28 LAB — MICROALBUMIN / CREATININE URINE RATIO

## 2018-10-28 LAB — FERRITIN

## 2018-10-28 LAB — TSH

## 2018-10-28 LAB — VITAMIN D 25 HYDROXY (VIT D DEFICIENCY, FRACTURES)

## 2019-01-24 ENCOUNTER — Other Ambulatory Visit: Payer: Self-pay | Admitting: Physician Assistant

## 2019-01-27 NOTE — Progress Notes (Deleted)
Assessment and Plan: Essential hypertension - continue medications, DASH diet, exercise and monitor at home. Call if greater than 130/80.   Abnormal glucose Discussed general issues about diabetes pathophysiology and management., Educational material distributed., Suggested low cholesterol diet.,   Morbid obesity, unspecified obesity type ([place]) Obesity with co morbidities- long discussion about weight loss, diet, and exercise   Hyperlipidemia -continue medications, check lipids, decrease fatty foods, increase activity.   Nonintractable headache, unspecified chronicity pattern, unspecified headache type Follow up neuro  Discussed med's effects and SE's. Screening labs and tests as requested with regular follow-up as recommended. Future Appointments  Date Time Provider [place]  01/29/2019  8:45 AM Vicie Mutters, PA-C [facility] None  10/27/2019  2:00 PM Vicie Mutters, PA-C [facility] None    HPI  49 y.o. female  presents for a follow up  Her blood pressure IS NOT CHECKED AT HOME.  She does not workout due to chronic pain. She denies chest pain, shortness of breath, dizziness.   BMI is There is no height or weight on file to calculate BMI. She could not tolerate wellbutrin. Has new job and states her stress is better.   he has GERD and had EGD 06/06/2016 and MRCP 06/06/2016.   Has seen neurology in the past for episodes of memory confusion, nerve pain randomly, and fasciculations only thing that helps is sleep , Dr. Jannifer Franklin in 2013, lyrica has helped some however she continues to have some worsening fasciculations/muscle tremors, states you can see the shaking the most in her hands, feels it in her extremities the most but can be abdomen as well.  Has noticed it more during the day and not just at night, compared to previous years.  Notices the tremors more after a migraine, will last 3 days.  She has been having more headaches and migraines, 3-4 x a month, will have visual  aura with migraines.   He has OSA, not on CPAP.   She is morbidly obese, had history of + OSA but unable to tolerate CPAP, last sleep study 2015.  Wt Readings from Last 3 Encounters:  10/24/18 (!) 301 lb (136.5 kg)  05/02/18 290 lb (131.5 kg)  10/23/17 299 lb 9.6 oz (135.9 kg)   She is not on cholesterol medication and denies myalgias. Her cholesterol is at goal. The cholesterol last visit was:   Lab Results  Component Value Date   CHOL 175 10/24/2018   HDL 48 (L) 10/24/2018   LDLCALC 110 (H) 10/24/2018   LDLDIRECT 134.4 07/26/2011   TRIG 80 10/24/2018   CHOLHDL 3.6 10/24/2018    She has been working on diet and exercise for prediabetes, and denies polydipsia, polyuria and visual disturbances. Last A1C in the office was:  Lab Results  Component Value Date   HGBA1C 5.9 (H) 10/24/2018   Patient is on Vitamin D supplement, 5000 IU daily.   Lab Results  Component Value Date   VD25OH 33 10/24/2018     She had L4-L5 fusion with Dr. Lynann Bologna in Sept 2016.   Current Medications:  Current Outpatient Medications on File Prior to Visit  Medication Sig Dispense Refill  . benzonatate (TESSALON PERLES) 100 MG capsule Take 1 capsule (100 mg total) by mouth 3 (three) times daily as needed. 20 capsule 0  . Cholecalciferol (VITAMIN D PO) Take 2,000 Units by mouth daily.     . fluticasone (FLONASE) 50 MCG/ACT nasal spray Place 2 sprays into both nostrils at bedtime. 16 g 3  . Galcanezumab-gnlm [facility])  120 MG/ML SOAJ Inject 120 mg into the skin every 30 (thirty) days. 1 pen 6  . levocetirizine (XYZAL) 5 MG tablet Take 1 tablet (5 mg total) by mouth daily. 90 tablet 1  . methocarbamol (ROBAXIN) 500 MG tablet TAKE 1 TABLET (500 MG TOTAL) BY MOUTH 3 (THREE) TIMES DAILY AS NEEDED FOR MUSCLE SPASMS. 90 tablet 1  . Multiple Vitamin (MULTIVITAMIN) capsule Take 1 capsule by mouth daily.    . pantoprazole (PROTONIX) 40 MG tablet Take 1 tablet Daily for Indigestion & Heartburn 90 tablet 3  .  pregabalin (LYRICA) 150 MG capsule Take 1 capsule 3 x/day for pain 270 capsule 1  . Rimegepant Sulfate (NURTEC) 75 MG TBDP Take 75 mg by mouth as needed (migraine). 2 tablet 0  . Rimegepant Sulfate (NURTEC) 75 MG TBDP Take 75 mg by mouth as needed (migraine). 8 tablet 0  . sucralfate (CARAFATE) 1 g tablet Take 1 tablet (1 g total) by mouth 4 (four) times daily -  with meals and at bedtime. 120 tablet 2  . SUMAtriptan (IMITREX) 50 MG tablet TAKE ONE TABLET BY MOUTH ONCE AS NEEDED FOR MIGRAINE. MAY REPEAT IN 2 HOURS IF HEADACHE PERSISTS OR RECURS 10 tablet 3   No current facility-administered medications on file prior to visit.   Medical History:  Past Medical History:  Diagnosis Date  . Allergic rhinitis   . Anemia   . Arthritis   . Chronic low back pain   . GERD (gastroesophageal reflux disease)   . Headache    migraine  . Hypertension   . 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 Obesity   . OSA on CPAP    does not use CPAP   Allergies No Known Allergies  SURGICAL HISTORY She  has a past surgical history that includes Cholecystectomy (Feb. 2011); Anterior lat lumbar fusion (N/A, 10/22/2014); and Esophagogastroduodenoscopy (egd) with propofol (N/A, 06/09/2016). FAMILY HISTORY Her family history includes Arthritis in her father and another family member; Barrett's esophagus in her father; Cancer in her mother; Fibromyalgia in her sister; Heart disease in an other family member; Hypertension in her father and another family member; Lung disease in her mother; Melanoma in her sister; Mental illness in an other family member; Stroke in an other family member. SOCIAL HISTORY She  reports that she has never smoked. She has never used smokeless tobacco. She reports current alcohol use. She reports that she does not use drugs.   Review of Systems:  Review of Systems  Constitutional: Negative.   HENT: Negative for congestion, ear discharge, ear pain, hearing loss, nosebleeds, sore throat and  tinnitus.   Eyes: Negative.   Respiratory: Negative.  Negative for stridor.   Cardiovascular: Negative.   Gastrointestinal: Negative for abdominal pain, constipation, diarrhea, heartburn and nausea.  Genitourinary: Negative.   Musculoskeletal: Negative for back pain, falls, joint pain, myalgias and neck pain.  Skin: Negative.   Neurological: Headaches:  TMJ.  Endo/Heme/Allergies: Negative.   Psychiatric/Behavioral: Negative.      Physical Exam: Estimated body mass index is 51.67 kg/m as calculated from the following:   Height as of 10/24/18: 5\' 4"  (1.626 m).   Weight as of 10/24/18: 301 lb (136.5 kg). There were no vitals taken for this visit. General Appearance: Well nourished, in no apparent distress.  Eyes: PERRLA, EOMs, conjunctiva no swelling or erythema, normal fundi and vessels.  Sinuses: No Frontal/maxillary tenderness  ENT/Mouth: Ext aud canals clear, normal light reflex with TMs without erythema, bulging. Good dentition. No erythema, swelling,  or exudate on post pharynx. Tonsils not swollen or erythematous. Hearing normal.  Neck: Supple, thyroid normal. No bruits  Respiratory: Respiratory effort normal, BS equal bilaterally without rales, rhonchi, wheezing or stridor.  Cardio: RRR without murmurs, rubs or gallops. Brisk peripheral pulses with 1+edema.  Chest: symmetric, with normal excursions and percussion.  Breasts: Symmetric, without lumps, nipple discharge, retractions.  Abdomen: Soft, nontender, obese no guarding, rebound, hernias, masses, or organomegaly. .  Lymphatics: Non tender without lymphadenopathy.  Genitourinary: defer Musculoskeletal: Full ROM all peripheral extremities,5/5 strength.  Skin: Warm, dry without rashes, lesions, ecchymosis. Neuro: Cranial nerves intact, reflexes equal bilaterally. Normal muscle tone, no cerebellar symptoms. Slightly tremor bilateral hands while at rest.  Psych: Awake and oriented X 3, normal affect, Insight and Judgment  appropriate.    Quentin Mulling 1:46 PM [facility]

## 2019-01-29 ENCOUNTER — Ambulatory Visit: Payer: 59 | Admitting: Physician Assistant

## 2019-02-03 ENCOUNTER — Other Ambulatory Visit: Payer: Self-pay | Admitting: Internal Medicine

## 2019-02-03 ENCOUNTER — Telehealth: Payer: 59 | Admitting: Physician Assistant

## 2019-02-03 DIAGNOSIS — J019 Acute sinusitis, unspecified: Secondary | ICD-10-CM | POA: Diagnosis not present

## 2019-02-03 DIAGNOSIS — B9789 Other viral agents as the cause of diseases classified elsewhere: Secondary | ICD-10-CM

## 2019-02-03 MED ORDER — AZELASTINE HCL 0.1 % NA SOLN: 2.0000 | Freq: Two times a day (BID) | NASAL | 0 refills | Status: DC

## 2019-02-03 NOTE — Progress Notes (Signed)

## 2019-02-26 ENCOUNTER — Other Ambulatory Visit: Payer: Self-pay | Admitting: Physician Assistant

## 2019-02-26 DIAGNOSIS — R519 Headache, unspecified: Secondary | ICD-10-CM

## 2019-02-27 ENCOUNTER — Other Ambulatory Visit: Payer: Self-pay | Admitting: Physician Assistant

## 2019-02-27 DIAGNOSIS — Z1231 Encounter for screening mammogram for malignant neoplasm of breast: Secondary | ICD-10-CM

## 2019-03-03 ENCOUNTER — Ambulatory Visit
Admission: RE | Admit: 2019-03-03 | Discharge: 2019-03-03 | Disposition: A | Discharge status: Home / Self Care | Payer: 59 | Source: Ambulatory Visit

## 2019-03-03 ENCOUNTER — Ambulatory Visit: Payer: 59 | Admitting: Physician Assistant

## 2019-03-03 ENCOUNTER — Other Ambulatory Visit: Payer: Self-pay

## 2019-03-03 VITALS — BP 134/76 | HR 80 | Temp 97.0°F | Resp 16 | Ht 64.0 in | Wt 300.8 lb

## 2019-03-03 DIAGNOSIS — E559 Vitamin D deficiency, unspecified: Secondary | ICD-10-CM

## 2019-03-03 DIAGNOSIS — E782 Mixed hyperlipidemia: Secondary | ICD-10-CM | POA: Diagnosis not present

## 2019-03-03 DIAGNOSIS — Z1231 Encounter for screening mammogram for malignant neoplasm of breast: Secondary | ICD-10-CM

## 2019-03-03 DIAGNOSIS — E8881 Metabolic syndrome: Secondary | ICD-10-CM

## 2019-03-03 DIAGNOSIS — R7309 Other abnormal glucose: Secondary | ICD-10-CM

## 2019-03-03 DIAGNOSIS — Z79899 Other long term (current) drug therapy: Secondary | ICD-10-CM

## 2019-03-03 DIAGNOSIS — I10 Essential (primary) hypertension: Secondary | ICD-10-CM

## 2019-03-03 NOTE — Progress Notes (Signed)
3 MONTH FOLLOW UP Assessment and Plan: Essential hypertension -     CBC with Differential/Platelet -     COMPLETE METABOLIC PANEL WITH GFR -     TSH - continue medications, DASH diet, exercise and monitor at home. Call if greater than 130/80.   Abnormal glucose -     Hemoglobin A1c Discussed disease progression and risks Discussed diet/exercise, weight management and risk modification  Hyperlipidemia -     Lipid panel check lipids decrease fatty foods increase activity.   Insulin resistance syndrome Discussed disease progression and risks Discussed diet/exercise, weight management and risk modification  Morbid obesity (BMI 50) - follow up 3 months for progress monitoring - increase veggies, decrease carbs - long discussion about weight loss, diet, and exercise  Vitamin D deficiency -     VITAMIN D 25 Hydroxy (Vit-D Deficiency, Fractures)  Medication management -     Magnesium   Discussed med's effects and SE's. Screening labs and tests as requested with regular follow-up as recommended. Future Appointments  Date Time Provider [place]  10/27/2019  2:00 PM Vicie Mutters, PA-C [id number] None    HPI  49 y.o. female  presents for a follow up  Her blood pressure has been controlled at home. BP: 134/76   She does not workout due to chronic pain. She denies chest pain, shortness of breath, dizziness.   BMI is Body mass index is 51.63 kg/m. She could not tolerate wellbutrin. Has new job and states her stress is better.   Had MGM today.   She is morbidly obese, had history of + OSA but unable to tolerate CPAP, last sleep study 2015.   Has seen neurology in the past for episodes of memory confusion, nerve pain randomly, and fasciculations only thing that helps is sleep , Dr. Jannifer Franklin in 2013. It worse with less sleep, stress, during menses.  Has had normal EEG, normal MRI. Now thinking that these episodes are migraines.  She is using the imitrix and nurtec 3-4 x  in a month, she states they help tremendously. She did not notice a difference with emgality so stopped it. Has auras and states that she is very good at taking the medications AS needed and does not want a preventative at this time.   Wt Readings from Last 3 Encounters:  03/03/19 (!) 300 lb 12.8 oz (136.4 kg)  10/24/18 (!) 301 lb (136.5 kg)  05/02/18 290 lb (131.5 kg)   She is not on cholesterol medication and denies myalgias. Her cholesterol is at goal. The cholesterol last visit was:   Lab Results  Component Value Date   CHOL 175 10/24/2018   HDL 48 (L) 10/24/2018   LDLCALC 110 (H) 10/24/2018   LDLDIRECT 134.4 07/26/2011   TRIG 80 10/24/2018   CHOLHDL 3.6 10/24/2018    She has been working on diet and exercise for prediabetes, and denies polydipsia, polyuria and visual disturbances. Last A1C in the office was:  Lab Results  Component Value Date   HGBA1C 5.9 (H) 10/24/2018   Patient is on Vitamin D supplement, 5000 IU daily.   Lab Results  Component Value Date   VD25OH 33 10/24/2018     She had L4-L5 fusion with Dr. Lynann Bologna in Sept 2016.   She has GERD and had EGD 06/06/2016 and MRCP 06/06/2016, on protonix and better controlled.   Current Medications:     Current Outpatient Medications (Respiratory):  .  fluticasone (FLONASE) 50 MCG/ACT nasal spray, Place 2 sprays into both  nostrils at bedtime. Marland Kitchen  levocetirizine (XYZAL) 5 MG tablet, Take 1 tablet (5 mg total) by mouth daily.  Current Outpatient Medications (Analgesics):  Marland Kitchen  NURTEC 75 MG TBDP, PLACE ONE TABLET BY MOUTH DAILY AS NEEDED .  SUMAtriptan (IMITREX) 50 MG tablet, TAKE ONE TABLET BY MOUTH ONCE AS NEEDED FOR MIGRAINE. MAY REPEAT IN 2 HOURS IF HEADACHE PERSISTS OR RECURS .  Galcanezumab-gnlm (EMGALITY) 120 MG/ML SOAJ, Inject 120 mg into the skin every 30 (thirty) days. (Patient not taking: Reported on 03/03/2019)   Current Outpatient Medications (Other):  Marland Kitchen  Cholecalciferol (VITAMIN D PO), Take 5,000 Units by  mouth daily.  .  methocarbamol (ROBAXIN) 500 MG tablet, TAKE 1 TABLET (500 MG TOTAL) BY MOUTH 3 (THREE) TIMES DAILY AS NEEDED FOR MUSCLE SPASMS. .  Multiple Vitamin (MULTIVITAMIN) capsule, Take 1 capsule by mouth daily. .  pantoprazole (PROTONIX) 40 MG tablet, Take 1 tablet Daily for Indigestion & Heartburn .  pregabalin (LYRICA) 150 MG capsule, Take 1 capsule 3 x  /day for Chronic Pain  Medical History:  Past Medical History:  Diagnosis Date  . Allergic rhinitis   . Anemia   . Arthritis   . Chronic low back pain   . GERD (gastroesophageal reflux disease)   . Headache    migraine  . Hypertension   . 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 Obesity   . OSA on CPAP    does not use CPAP   Allergies No Known Allergies  SURGICAL HISTORY She  has a past surgical history that includes Cholecystectomy (Feb. 2011); Anterior lat lumbar fusion (N/A, 10/22/2014); and Esophagogastroduodenoscopy (egd) with propofol (N/A, 06/09/2016). FAMILY HISTORY Her family history includes Arthritis in her father and another family member; Barrett's esophagus in her father; Cancer in her mother; Fibromyalgia in her sister; Heart disease in an other family member; Hypertension in her father and another family member; Lung disease in her mother; Melanoma in her sister; Mental illness in an other family member; Stroke in an other family member. SOCIAL HISTORY She  reports that she has never smoked. She has never used smokeless tobacco. She reports current alcohol use. She reports that she does not use drugs.   Review of Systems:  Review of Systems  Constitutional: Negative.   HENT: Negative for congestion, ear discharge, ear pain, hearing loss, nosebleeds, sore throat and tinnitus.   Eyes: Negative.   Respiratory: Negative.  Negative for stridor.   Cardiovascular: Negative.   Gastrointestinal: Negative for abdominal pain, constipation, diarrhea, heartburn and nausea.  Genitourinary: Negative.   Musculoskeletal:  Negative for back pain, falls, joint pain, myalgias and neck pain.  Skin: Negative.   Neurological: Headaches:  TMJ.  Endo/Heme/Allergies: Negative.   Psychiatric/Behavioral: Negative.      Physical Exam: Estimated body mass index is 51.63 kg/m as calculated from the following:   Height as of this encounter: 5\' 4"  (1.626 m).   Weight as of this encounter: 300 lb 12.8 oz (136.4 kg). BP 134/76   Pulse 80   Temp (!) 97 F (36.1 C)   Resp 16   Ht 5\' 4"  (1.626 m)   Wt (!) 300 lb 12.8 oz (136.4 kg)   BMI 51.63 kg/m  General Appearance: Well nourished, in no apparent distress.  Eyes: PERRLA, EOMs, conjunctiva no swelling or erythema, normal fundi and vessels.  Sinuses: No Frontal/maxillary tenderness  ENT/Mouth: Ext aud canals clear, normal light reflex with TMs without erythema, bulging. Good dentition. No erythema, swelling, or exudate on post pharynx. Tonsils  not swollen or erythematous. Hearing normal.  Neck: Supple, thyroid normal. No bruits  Respiratory: Respiratory effort normal, BS equal bilaterally without rales, rhonchi, wheezing or stridor.  Cardio: RRR without murmurs, rubs or gallops. Brisk peripheral pulses with 1+edema.  Chest: symmetric, with normal excursions and percussion.  Breasts: Symmetric, without lumps, nipple discharge, retractions.  Abdomen: Soft, nontender, obese no guarding, rebound, hernias, masses, or organomegaly. .  Lymphatics: Non tender without lymphadenopathy.  Genitourinary: defer Musculoskeletal: Full ROM all peripheral extremities,5/5 strength.  Skin: Warm, dry without rashes, lesions, ecchymosis. Neuro: Cranial nerves intact, reflexes equal bilaterally. Normal muscle tone, no cerebellar symptoms.  Psych: Awake and oriented X 3, normal affect, Insight and Judgment appropriate.   Quentin Mulling 10:52 AM [facility]

## 2019-03-03 NOTE — Patient Instructions (Signed)

## 2019-03-04 LAB — LIPID PANEL
Cholesterol: 162 mg/dL (ref <200)
HDL: 48 mg/dL — ABNORMAL LOW (ref >=50)
LDL Cholesterol (Calc): 89 mg/dL (calc)
Non-HDL Cholesterol (Calc): 114 mg/dL (calc) (ref <130)
Total CHOL/HDL Ratio: 3.4 (calc) (ref <5.0)
Triglycerides: 152 mg/dL — ABNORMAL HIGH (ref <150)

## 2019-03-04 LAB — COMPLETE METABOLIC PANEL WITH GFR
AG Ratio: 1.4 (calc) (ref 1.0–2.5)
ALT: 13 U/L (ref 6–29)
AST: 13 U/L (ref 10–35)
Albumin: 4 g/dL (ref 3.6–5.1)
Alkaline phosphatase (APISO): 84 U/L (ref 31–125)
BUN: 14 mg/dL (ref 7–25)
CO2: 27 mmol/L (ref 20–32)
Calcium: 9.2 mg/dL (ref 8.6–10.2)
Chloride: 104 mmol/L (ref 98–110)
Creat: 0.57 mg/dL (ref 0.50–1.10)
GFR, Est African American: 129 mL/min/{1.73_m2} (ref >=60)
GFR, Est Non African American: 111 mL/min/{1.73_m2} (ref >=60)
Globulin: 2.9 g/dL (calc) (ref 1.9–3.7)
Glucose, Bld: 130 mg/dL — ABNORMAL HIGH (ref 65–99)
Potassium: 4.5 mmol/L (ref 3.5–5.3)
Sodium: 139 mmol/L (ref 135–146)
Total Bilirubin: 0.2 mg/dL (ref 0.2–1.2)
Total Protein: 6.9 g/dL (ref 6.1–8.1)

## 2019-03-04 LAB — CBC WITH DIFFERENTIAL/PLATELET
Absolute Monocytes: 616 cells/uL (ref 200–950)
Basophils Absolute: 46 cells/uL (ref 0–200)
Basophils Relative: 0.4 %
Eosinophils Absolute: 285 cells/uL (ref 15–500)
Eosinophils Relative: 2.5 %
HCT: 36 % (ref 35.0–45.0)
Hemoglobin: 11.7 g/dL (ref 11.7–15.5)
Lymphs Abs: 1984 cells/uL (ref 850–3900)
MCH: 26.7 pg — ABNORMAL LOW (ref 27.0–33.0)
MCHC: 32.5 g/dL (ref 32.0–36.0)
MCV: 82 fL (ref 80.0–100.0)
MPV: 10.6 fL (ref 7.5–12.5)
Monocytes Relative: 5.4 %
Neutro Abs: 8470 cells/uL — ABNORMAL HIGH (ref 1500–7800)
Neutrophils Relative %: 74.3 %
Platelets: 381 10*3/uL (ref 140–400)
RBC: 4.39 10*6/uL (ref 3.80–5.10)
RDW: 13.6 % (ref 11.0–15.0)
Total Lymphocyte: 17.4 %
WBC: 11.4 10*3/uL — ABNORMAL HIGH (ref 3.8–10.8)

## 2019-03-04 LAB — HEMOGLOBIN A1C
Hgb A1c MFr Bld: 6 % of total Hgb — ABNORMAL HIGH (ref <5.7)
Mean Plasma Glucose: 126 (calc)
eAG (mmol/L): 7 (calc)

## 2019-03-04 LAB — MAGNESIUM: Magnesium: 2 mg/dL (ref 1.5–2.5)

## 2019-03-04 LAB — VITAMIN D 25 HYDROXY (VIT D DEFICIENCY, FRACTURES): Vit D, 25-Hydroxy: 33 ng/mL (ref 30–100)

## 2019-03-04 LAB — TSH: TSH: 4.28 mIU/L

## 2019-03-07 ENCOUNTER — Other Ambulatory Visit: Payer: Self-pay | Admitting: Physician Assistant

## 2019-03-07 DIAGNOSIS — R928 Other abnormal and inconclusive findings on diagnostic imaging of breast: Secondary | ICD-10-CM

## 2019-03-10 DIAGNOSIS — I10 Essential (primary) hypertension: Secondary | ICD-10-CM

## 2019-03-10 DIAGNOSIS — E559 Vitamin D deficiency, unspecified: Secondary | ICD-10-CM

## 2019-03-21 ENCOUNTER — Other Ambulatory Visit: Payer: Self-pay

## 2019-03-21 ENCOUNTER — Ambulatory Visit
Admission: RE | Admit: 2019-03-21 | Discharge: 2019-03-21 | Disposition: A | Discharge status: Home / Self Care | Payer: 59 | Source: Ambulatory Visit | Attending: Physician Assistant | Admitting: Physician Assistant

## 2019-03-21 ENCOUNTER — Other Ambulatory Visit: Payer: Self-pay | Admitting: Physician Assistant

## 2019-03-21 DIAGNOSIS — N6489 Other specified disorders of breast: Secondary | ICD-10-CM

## 2019-03-21 DIAGNOSIS — R928 Other abnormal and inconclusive findings on diagnostic imaging of breast: Secondary | ICD-10-CM

## 2019-03-26 ENCOUNTER — Ambulatory Visit
Admission: RE | Admit: 2019-03-26 | Discharge: 2019-03-26 | Disposition: A | Discharge status: Home / Self Care | Payer: 59 | Source: Ambulatory Visit | Attending: Physician Assistant | Admitting: Physician Assistant

## 2019-03-26 ENCOUNTER — Other Ambulatory Visit: Payer: Self-pay

## 2019-03-26 DIAGNOSIS — N6489 Other specified disorders of breast: Secondary | ICD-10-CM

## 2019-03-28 ENCOUNTER — Other Ambulatory Visit: Payer: Self-pay | Admitting: Physician Assistant

## 2019-03-28 ENCOUNTER — Other Ambulatory Visit: Payer: Self-pay

## 2019-03-28 DIAGNOSIS — R519 Headache, unspecified: Secondary | ICD-10-CM

## 2019-04-03 ENCOUNTER — Ambulatory Visit (HOSPITAL_COMMUNITY)
Admission: EM | Admit: 2019-04-03 | Discharge: 2019-04-03 | Disposition: A | Discharge status: Home / Self Care | Payer: 59 | Attending: Internal Medicine | Admitting: Internal Medicine

## 2019-04-03 ENCOUNTER — Encounter (HOSPITAL_COMMUNITY): Payer: Self-pay

## 2019-04-03 ENCOUNTER — Other Ambulatory Visit: Payer: Self-pay

## 2019-04-03 DIAGNOSIS — H1131 Conjunctival hemorrhage, right eye: Secondary | ICD-10-CM | POA: Diagnosis not present

## 2019-04-03 MED ORDER — CARBOXYMETHYLCELLULOSE SODIUM 1 % OP SOLN: 1.0000 [drp] | Freq: Three times a day (TID) | OPHTHALMIC | 12 refills | Status: AC

## 2019-04-03 NOTE — ED Triage Notes (Signed)
Pt presents with right eye redness and pain upon waking up this morning not related to any injury; pt states she had a migraine before bed last and took her medication and then woke up with her eye red and painful.

## 2019-04-03 NOTE — Discharge Instructions (Signed)
Use saline eyedrops If you notice any worsening of her vision, increased redness of the eye, worsening light sensitivity or double vision please call the urgent care immediately for ophthalmology referral.

## 2019-04-03 NOTE — ED Provider Notes (Signed)
[place]    CSN: [id number] Arrival date & time: 04/03/19  4098      History   Chief Complaint Chief Complaint  Patient presents with  . Eye Problem    HPI Emily Gay is a 49 y.o. female with a history of migraine comes to urgent care with pain and burning sensation in the right thigh of 1 day duration.  Patient has been battling migraines over the past day also.  After taking Imitrex the headache seems to have improved.  She went to work.  Last night woke up this morning with burning sensation in the right eye.  No blurry vision or double vision.  Patient denies any trauma to the eye.  Patient denies a history of hypertension.  No itching in the eyes.  No swelling around the eye.  Mild pain in the eyeball.  No known relieving factors.Marland Kitchen   HPI  Past Medical History:  Diagnosis Date  . Allergic rhinitis   . Anemia   . Arthritis   . Chronic low back pain   . GERD (gastroesophageal reflux disease)   . Headache    migraine  . Hypertension   . 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 Obesity   . OSA on CPAP    does not use CPAP    Patient Active Problem List   Diagnosis Date Noted  . Radiculopathy 10/22/2014  . OSA (obstructive sleep apnea) 10/17/2013  . 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 Headache 08/13/2013  . GERD 05/16/2013  . Hyperlipidemia 05/16/2013  . Abnormal glucose 05/16/2013  . Encounter for long-term (current) use of medications 05/16/2013  . Insulin resistance syndrome 05/16/2013  . Vitamin D deficiency 02/21/2013  . TMJ PAIN 11/24/2009  . ANEMIA, MILD 08/19/2009  . Morbid obesity (BMI 50) 10/14/2008  . Essential hypertension 10/14/2008  . Allergic rhinitis 10/13/2008    Past Surgical History:  Procedure Laterality Date  . ANTERIOR LAT LUMBAR FUSION N/A 10/22/2014   Procedure: ANTERIOR LATERAL LUMBAR FUSION 1 LEVEL;  Surgeon: Phylliss Bob, MD;  Location: [facility] [place];  Service: Orthopedics;  Laterality: N/A;  . CHOLECYSTECTOMY  Feb. 2011   Gall  Bladder  . ESOPHAGOGASTRODUODENOSCOPY (EGD) WITH PROPOFOL N/A 06/09/2016   Procedure: ESOPHAGOGASTRODUODENOSCOPY (EGD) WITH PROPOFOL;  Surgeon: Mauri Pole, MD;  Location: [facility];  Service: Endoscopy;  Laterality: N/A;    OB History   No obstetric history on file.      Home Medications    Prior to Admission medications   Medication Sig Start Date End Date Taking? Authorizing Provider  carboxymethylcellulose 1 % ophthalmic solution Apply 1 drop to eye 3 (three) times daily. 04/03/19   LampteyMyrene Galas, MD  Cholecalciferol (VITAMIN D PO) Take 5,000 Units by mouth daily.     [provider]  fluticasone (FLONASE) 50 MCG/ACT nasal spray Place 2 sprays into both nostrils at bedtime. 10/24/18   Vicie Mutters, PA-C  levocetirizine (XYZAL) 5 MG tablet Take 1 tablet (5 mg total) by mouth daily. 10/24/18   Vicie Mutters, PA-C  methocarbamol (ROBAXIN) 500 MG tablet TAKE 1 TABLET (500 MG TOTAL) BY MOUTH 3 (THREE) TIMES DAILY AS NEEDED FOR MUSCLE SPASMS. 10/23/17   Vicie Mutters, PA-C  Multiple Vitamin (MULTIVITAMIN) capsule Take 1 capsule by mouth daily.    [provider]  NURTEC 75 MG TBDP PLACE ONE TABLET BY MOUTH DAILY AS NEEDED 03/31/19   Vicie Mutters, PA-C  pantoprazole (PROTONIX) 40 MG tablet Take 1 tablet Daily for Indigestion & Heartburn 01/24/19  Lucky Cowboy, MD  pregabalin (LYRICA) 150 MG capsule Take 1 capsule 3 x  /day for Chronic Pain 02/03/19   Lucky Cowboy, MD  SUMAtriptan (IMITREX) 50 MG tablet TAKE ONE TABLET BY MOUTH ONCE AS NEEDED FOR MIGRAINE. MAY REPEAT IN 2 HOURS IF HEADACHE PERSISTS OR RECURS 10/24/18   Quentin Mulling, PA-C    Family History Family History  Problem Relation Age of Onset  . Cancer Mother        lyphoma  . Lung disease Mother   . Arthritis Father   . Hypertension Father   . Barrett's esophagus Father   . Melanoma Sister   . Fibromyalgia Sister   . Mental illness Other        emotional illness (other blood  relative)  . Stroke Other        Stroke (parent and grandparent)  . Heart disease Other        (grandparent)  . Hypertension Other        (other blood relative)  . Arthritis Other        Parent and grandparent  . Colon cancer Neg Hx   . Rectal cancer Neg Hx   . Stomach cancer Neg Hx   . Esophageal cancer Neg Hx   . Liver cancer Neg Hx     Social History Social History   Tobacco Use  . Smoking status: Never Smoker  . Smokeless tobacco: Never Used  Substance Use Topics  . Alcohol use: Yes    Comment: occasionally  . Drug use: No     Allergies   Patient has no known allergies.   Review of Systems Review of Systems  Constitutional: Negative for activity change, fatigue and fever.  HENT: Negative for congestion and rhinorrhea.   Gastrointestinal: Negative for diarrhea, nausea and vomiting.  Skin: Negative for color change, pallor and rash.  Neurological: Positive for headaches. Negative for dizziness, facial asymmetry and light-headedness.  Psychiatric/Behavioral: Negative for confusion and decreased concentration.     Physical Exam Triage Vital Signs ED Triage Vitals [04/03/19 1004]  Enc Vitals Group     BP (!) 142/62     Pulse Rate 100     Resp 18     Temp 98.4 F (36.9 C)     Temp Source Oral     SpO2 98 %     Weight      Height      Head Circumference      Peak Flow      Pain Score      Pain Loc      Pain Edu?      Excl. in GC?    No data found.  Updated Vital Signs BP (!) 142/62 (BP Location: Left Arm)   Pulse 100   Temp 98.4 F (36.9 C) (Oral)   Resp 18   SpO2 98%   Visual Acuity Right Eye Distance:   Left Eye Distance:   Bilateral Distance:    Right Eye Near:   Left Eye Near:    Bilateral Near:     Physical Exam Vitals and nursing note reviewed.  Constitutional:      General: She is not in acute distress.    Appearance: Normal appearance. She is not ill-appearing.  Cardiovascular:     Rate and Rhythm: Normal rate and regular  rhythm.  Neurological:     Mental Status: She is alert.      UC Treatments / Results  Labs (all labs ordered are listed,  but only abnormal results are displayed) Labs Reviewed - No data to display  EKG   Radiology No results found.  Procedures Procedures (including critical care time)  Medications Ordered in UC Medications - No data to display  Initial Impression / Assessment and Plan / UC Course  I have reviewed the triage vital signs and the nursing notes.  Pertinent labs & imaging results that were available during my care of the patient were reviewed by me and considered in my medical decision making (see chart for details).     1.  Subconjunctival hematoma on the right eye: Fluorescein stain is negative for corneal abrasions Pupils are equal and reactive to light Use saline eyedrops If patient notices any changes in medication, increased redness in the eye or photosensitivity she is advised to call urgent care immediately for ophthalmology referral. Return precautions given. Final Clinical Impressions(s) / UC Diagnoses   Final diagnoses:  Subconjunctival hemorrhage of right eye     Discharge Instructions     Use saline eyedrops If you notice any worsening of her vision, increased redness of the eye, worsening light sensitivity or double vision please call the urgent care immediately for ophthalmology referral.    ED Prescriptions    Medication Sig Dispense Auth. Provider   carboxymethylcellulose 1 % ophthalmic solution Apply 1 drop to eye 3 (three) times daily. 30 mL Mychelle Kendra, Britta Mccreedy, MD     PDMP not reviewed this encounter.   Merrilee Jansky, MD 04/07/19 1012

## 2019-05-19 ENCOUNTER — Other Ambulatory Visit: Payer: Self-pay | Admitting: Internal Medicine

## 2019-06-16 ENCOUNTER — Telehealth: Payer: 59 | Admitting: Emergency Medicine

## 2019-06-16 DIAGNOSIS — R300 Dysuria: Secondary | ICD-10-CM

## 2019-06-16 MED ORDER — CEPHALEXIN 500 MG PO CAPS: 500.0000 mg | ORAL_CAPSULE | Freq: Two times a day (BID) | ORAL | 0 refills | Status: AC

## 2019-06-16 NOTE — Progress Notes (Signed)

## 2019-07-15 ENCOUNTER — Encounter (HOSPITAL_COMMUNITY): Payer: Self-pay | Admitting: Emergency Medicine

## 2019-07-15 ENCOUNTER — Other Ambulatory Visit: Payer: Self-pay

## 2019-07-15 ENCOUNTER — Emergency Department (HOSPITAL_COMMUNITY)
Admission: EM | Admit: 2019-07-15 | Discharge: 2019-07-15 | Disposition: A | Discharge status: Home / Self Care | Payer: 59 | Attending: Emergency Medicine | Admitting: Emergency Medicine

## 2019-07-15 DIAGNOSIS — Z5321 Procedure and treatment not carried out due to patient leaving prior to being seen by health care provider: Secondary | ICD-10-CM | POA: Insufficient documentation

## 2019-07-15 DIAGNOSIS — R109 Unspecified abdominal pain: Secondary | ICD-10-CM | POA: Insufficient documentation

## 2019-07-15 DIAGNOSIS — R110 Nausea: Secondary | ICD-10-CM | POA: Insufficient documentation

## 2019-07-15 LAB — COMPREHENSIVE METABOLIC PANEL
ALT: 15 U/L (ref 0–44)
AST: 14 U/L — ABNORMAL LOW (ref 15–41)
Albumin: 4 g/dL (ref 3.5–5.0)
Alkaline Phosphatase: 77 U/L (ref 38–126)
Anion gap: 11 (ref 5–15)
BUN: 13 mg/dL (ref 6–20)
CO2: 26 mmol/L (ref 22–32)
Calcium: 9 mg/dL (ref 8.9–10.3)
Chloride: 103 mmol/L (ref 98–111)
Creatinine, Ser: 0.62 mg/dL (ref 0.44–1.00)
GFR calc Af Amer: >60 mL/min (ref >60)
GFR calc non Af Amer: >60 mL/min (ref >60)
Glucose, Bld: 103 mg/dL — ABNORMAL HIGH (ref 70–99)
Potassium: 4.3 mmol/L (ref 3.5–5.1)
Sodium: 140 mmol/L (ref 135–145)
Total Bilirubin: 0.4 mg/dL (ref 0.3–1.2)
Total Protein: 7.8 g/dL (ref 6.5–8.1)

## 2019-07-15 LAB — CBC
HCT: 38.2 % (ref 36.0–46.0)
Hemoglobin: 11.8 g/dL — ABNORMAL LOW (ref 12.0–15.0)
MCH: 26.8 pg (ref 26.0–34.0)
MCHC: 30.9 g/dL (ref 30.0–36.0)
MCV: 86.6 fL (ref 80.0–100.0)
Platelets: 410 10*3/uL — ABNORMAL HIGH (ref 150–400)
RBC: 4.41 MIL/uL (ref 3.87–5.11)
RDW: 14 % (ref 11.5–15.5)
WBC: 10.6 10*3/uL — ABNORMAL HIGH (ref 4.0–10.5)
nRBC: 0 % (ref 0.0–0.2)

## 2019-07-15 LAB — LIPASE, BLOOD: Lipase: 25 U/L (ref 11–51)

## 2019-07-15 LAB — I-STAT BETA HCG BLOOD, ED (MC, WL, AP ONLY): I-stat hCG, quantitative: <5 m[IU]/mL (ref <5)

## 2019-07-15 MED ORDER — SODIUM CHLORIDE 0.9% FLUSH: 3.0000 mL | Freq: Once | INTRAVENOUS | Status: DC

## 2019-07-15 NOTE — ED Triage Notes (Signed)
Pt c/o right side pains that radiates to right flank since yesterday. reports some nausea. Denies any bowel or urinary problems.

## 2019-07-16 ENCOUNTER — Encounter: Payer: Self-pay | Admitting: Internal Medicine

## 2019-07-16 ENCOUNTER — Ambulatory Visit: Payer: 59 | Admitting: Internal Medicine

## 2019-07-16 ENCOUNTER — Other Ambulatory Visit: Payer: Self-pay | Admitting: Physician Assistant

## 2019-07-16 VITALS — BP 134/84 | HR 84 | Temp 96.9°F | Resp 16 | Ht 64.0 in | Wt 297.2 lb

## 2019-07-16 DIAGNOSIS — R079 Chest pain, unspecified: Secondary | ICD-10-CM

## 2019-07-16 DIAGNOSIS — R109 Unspecified abdominal pain: Secondary | ICD-10-CM

## 2019-07-16 NOTE — Progress Notes (Signed)
     History of Present Illness:    Patient is a nice 49 yo WF who went to [facility] ER yesterday for c/o Rt chest & flank pains. After waiting 7 hours, she left w/o seeing a provider. She reports a pain in her Rt chest & flank area. She had labs including a Normal CBC, CMET, Lipase and neg HCG preg test. She returned home & slept w/o difficulty. Denies injury or fall. She denies any N/V, GI sx's, dyspnea, head or chest congestion, or UT sx's or blood in urine.   Medications  .  fluticasone (FLONASE) 50 MCG/ACT nasal spray, Place 2 sprays into both nostrils at bedtime. Marland Kitchen  levocetirizine (XYZAL) 5 MG tablet, Take 1 tablet (5 mg total) by mouth daily. .  SUMAtriptan (IMITREX) 50 MG tablet, TAKE ONE TABLET BY MOUTH ONCE AS NEEDED FOR MIGRAINE. MAY REPEAT IN 2 HOURS IF HEADACHE PERSISTS OR RECURS .  carboxymethylcellulose 1 % ophthalmic solution, Apply 1 drop to eye 3 (three) times daily. .  Cholecalciferol (VITAMIN D PO), Take 5,000 Units by mouth daily.  .  methocarbamol (ROBAXIN) 500 MG tablet, TAKE 1 TABLET (500 MG TOTAL) BY MOUTH 3 (THREE) TIMES DAILY AS NEEDED FOR MUSCLE SPASMS. .  Multiple Vitamin (MULTIVITAMIN) capsule, Take 1 capsule by mouth daily. .  pantoprazole (PROTONIX) 40 MG tablet, Take 1 tablet Daily for Indigestion & Heartburn .  pregabalin (LYRICA) 150 MG capsule, TAKE ONE CAPSULE BY MOUTH THREE TIMES A DAY FOR CHRONIC PAIN  Problem list She has Morbid obesity (BMI 50); ANEMIA, MILD; Essential hypertension; Allergic rhinitis; TMJ PAIN; Vitamin D deficiency; GERD; Hyperlipidemia; Abnormal glucose; Encounter for long-term (current) use of medications; Insulin resistance syndrome; Headache; Nocturnal myoclonus; OSA (obstructive sleep apnea); and Radiculopathy on their problem list.   Observations/Objective:   BP 134/84   Pulse 84   Temp (!) 96.9 F (36.1 C)   Resp 16   Ht 5\' 4"  (1.626 m)   Wt 297 lb 3.2 oz (134.8 kg)   LMP 07/10/2019   BMI 51.01 kg/m   HEENT - WNL. Neck -  supple.  Chest - Clear equal BS. (+) exquisite tenderness of Rt lateral ribs / chest wall.  Cor - Nl HS. RRR w/o sig MGR. PP 1(+). No edema. Abd- Soft, w/o masses or point tenderness or guarding in abdomen, but is tender over Rt flank area. MS- FROM w/o deformities.  Gait Nl. Neuro -  Nl w/o focal abnormalities.  Assessment and Plan:  1. Chest pain, suspect Costochondritis  - DG Ribs Unilateral Right; Future  2. Acute right flank pain, suspect musculoskeletal pain  - Urinalysis, Routine w reflex microscopic  - Recc try heating pad pending CXR & U/A  Follow Up Instructions:     I discussed the assessment and treatment plan with the patient. The patient was provided an opportunity to ask questions and all were answered. The patient agreed with the plan and demonstrated an understanding of the instructions.      The patient was advised to call back or seek an in-person evaluation if the symptoms worsen or if the condition fails to improve as anticipated.  07/12/2019, MD

## 2019-07-17 ENCOUNTER — Ambulatory Visit
Admission: RE | Admit: 2019-07-17 | Discharge: 2019-07-17 | Disposition: A | Discharge status: Home / Self Care | Payer: 59 | Source: Ambulatory Visit | Attending: Internal Medicine | Admitting: Internal Medicine

## 2019-07-17 ENCOUNTER — Other Ambulatory Visit: Payer: Self-pay

## 2019-07-17 ENCOUNTER — Other Ambulatory Visit: Payer: Self-pay | Admitting: Internal Medicine

## 2019-07-17 DIAGNOSIS — R079 Chest pain, unspecified: Secondary | ICD-10-CM

## 2019-07-17 LAB — URINALYSIS, ROUTINE W REFLEX MICROSCOPIC
Bilirubin Urine: NEGATIVE
Glucose, UA: NEGATIVE
Hgb urine dipstick: NEGATIVE
Ketones, ur: NEGATIVE
Leukocytes,Ua: NEGATIVE
Nitrite: NEGATIVE
Protein, ur: NEGATIVE
Specific Gravity, Urine: 1.005 (ref 1.001–1.03)
pH: 7.5 (ref 5.0–8.0)

## 2019-07-17 NOTE — Progress Notes (Signed)
==========================================================  -   U/A is OK -No Blood or signs of Infection

## 2019-07-18 ENCOUNTER — Other Ambulatory Visit: Payer: Self-pay | Admitting: Internal Medicine

## 2019-07-18 DIAGNOSIS — R1031 Right lower quadrant pain: Secondary | ICD-10-CM

## 2019-07-18 DIAGNOSIS — R1011 Right upper quadrant pain: Secondary | ICD-10-CM

## 2019-07-18 MED ORDER — DEXAMETHASONE 4 MG PO TABS: ORAL_TABLET | ORAL | 0 refills | Status: DC

## 2019-07-18 MED ORDER — MELOXICAM 15 MG PO TABS: ORAL_TABLET | ORAL | 0 refills | Status: DC

## 2019-07-18 NOTE — Progress Notes (Signed)
=========================================================  -    Chest X-Ray appears Normal.   - Heart size & shape - Normal   - Lungs appear clear & Normal  No sign of pneumonia, blood clots, or Lung cancer  - Ribs appear Normal  - No sign of fracture  - Suspect the pain that you are experiencing is in the chest wall or muscles  - So...................  I sent in a Rx for Dexamethasone (like prednisone) taper - reducing dose over the next 10 days  - You may also try using a heating pad to your right  side to see if that helps.

## 2019-07-21 ENCOUNTER — Telehealth: Payer: Self-pay | Admitting: Gastroenterology

## 2019-07-21 NOTE — Telephone Encounter (Signed)
Pt is requesting a call back from a nurse. Pt has been experiencing abdominal pain, no appts available with APP or Dr Lavon Paganini. Pt would like a call back to get some advice on what she can do until she can be seen.

## 2019-07-21 NOTE — Telephone Encounter (Signed)
Spoke with this very nice patient. She had a quick onset of RUQ pain. It started in the night and got worse to the point she visited the ED. The wait was over 7 hours and she did not stay. She saw her PCP the next day. PCP r/o broken rib. Put her on a steroid burst. Minimal improvement.  The pain reminds her of what she experienced before she had the cholecystectomy. She describes it as a "gastritis" type pain. It is radiating into her entire right side with nausea, no vomiting. She had diarrhea. Took Pepto-Bismol. She is eating very small amounts of bland foods. Nausea has improved. No further diarrhea. Only the burning now. She asks if she can try Carafate again. "Helpedbefore" and she would "like to at least try it." She is consistent in taking daily pantoprazole. Thanks

## 2019-07-22 ENCOUNTER — Other Ambulatory Visit: Payer: Self-pay

## 2019-07-22 MED ORDER — SUCRALFATE 1 G PO TABS: 1.0000 g | ORAL_TABLET | Freq: Three times a day (TID) | ORAL | 0 refills | Status: DC

## 2019-07-22 NOTE — Telephone Encounter (Signed)
Okay to send prescription for Carafate 1 g before meals and at bedtime as needed X 120 tabs no refills.  Please schedule follow-up office visit next available with me or APP. Thank you

## 2019-07-22 NOTE — Telephone Encounter (Signed)
Patient is advised. Appointment 09/18/19 at 11:00am.

## 2019-08-04 NOTE — Progress Notes (Signed)
SUBJECTIVE:  Emily Gay is a 49 y.o. female who complains of migraine headache for has been having more frequently once a week, lasting 24-48 hours- will have to miss some of work.   States when she had nurtec she was able to better manage, with the imitrex she has to take 2 and it will take longer for it to work and working afterwards can be difficult.  She is on lyrica for migraine prevention. She tried emagility but states it did not help.  She has a well established history of recurrent migraines. No changes in quality.   no changes in vision, no tinnitus with HA, recently saw her eye doctor,  MRI brain 10/05/2013  There are no associated abnormal neurological symptoms such as TIA's, loss of balance, loss of vision or speech, numbness or weakness on review. Past neurological history: negative for stroke, MS, epilepsy, or brain tumor.   Current Outpatient Medications  Medication Sig Dispense Refill  . carboxymethylcellulose 1 % ophthalmic solution Apply 1 drop to eye 3 (three) times daily. 30 mL 12  . Cholecalciferol (VITAMIN D PO) Take 5,000 Units by mouth daily.     Marland Kitchen dexamethasone (DECADRON) 4 MG tablet Take 1 tab 3 x day - 3 days, then 2 x day - 3 days, then 1 tab daily 20 tablet 0  . fluticasone (FLONASE) 50 MCG/ACT nasal spray Place 2 sprays into both nostrils at bedtime. 16 g 3  . levocetirizine (XYZAL) 5 MG tablet Take 1 tablet (5 mg total) by mouth daily. 90 tablet 1  . meloxicam (MOBIC) 15 MG tablet Take 1/2 to 1 tablet Daily with Food for Pain & Inflammation 90 tablet 0  . methocarbamol (ROBAXIN) 500 MG tablet TAKE 1 TABLET (500 MG TOTAL) BY MOUTH 3 (THREE) TIMES DAILY AS NEEDED FOR MUSCLE SPASMS. 90 tablet 1  . Multiple Vitamin (MULTIVITAMIN) capsule Take 1 capsule by mouth daily.    . NURTEC 75 MG TBDP PLACE ONE TABLET BY MOUTH DAILY AS NEEDED (Patient not taking: Reported on 07/16/2019) 8 tablet 0  . pantoprazole (PROTONIX) 40 MG tablet Take 1 tablet Daily for  Indigestion & Heartburn 90 tablet 3  . pregabalin (LYRICA) 150 MG capsule TAKE ONE CAPSULE BY MOUTH THREE TIMES A DAY FOR CHRONIC PAIN 270 capsule 0  . sucralfate (CARAFATE) 1 g tablet Take 1 tablet (1 g total) by mouth 4 (four) times daily -  with meals and at bedtime. 120 tablet 0  . SUMAtriptan (IMITREX) 50 MG tablet TAKE ONE TABLET AT ONSET OF HEADACHE; MAY REPEAT ONE TABLET IN 2 HOURS IF NEEDED IF HEADACHE PERSISTS OR RECURS 10 tablet 2   No current facility-administered medications for this visit.    OBJECTIVE:   Her vitals are normal. Alert and oriented x 3.  Ears and throat normal. Neck fully supple without nodes. Sinuses non tender. Cranial nerves are normal.   DTR's normal and symmetric.  Mental status normal. Cerebellar function normal.   ASSESSMENT:  Migraine headache  PLAN:  Emily Gay was seen today for migraine.  Diagnoses and all orders for this visit:  Migraine without aura and without status migrainosus, not intractable -     Ambulatory referral to Neurology -     verapamil (CALAN-SR) 120 MG CR tablet; Take 1 tablet (120 mg total) by mouth daily. - will refer to neuro - will try verapamil first as prevention, Ubelry samples given to patient - may retry nurtec as prevention since patient did well with it but  insurance denied this.   The patient was advised to call immediately if she has any concerning symptoms in the interval. The patient voices understanding of current treatment options and is in agreement with the current care plan.The patient knows to call the clinic with any problems, questions or concerns or go to the ER if any further progression of symptoms.

## 2019-08-06 ENCOUNTER — Ambulatory Visit: Payer: 59 | Admitting: Physician Assistant

## 2019-08-06 ENCOUNTER — Other Ambulatory Visit: Payer: Self-pay

## 2019-08-06 ENCOUNTER — Encounter: Payer: Self-pay | Admitting: Physician Assistant

## 2019-08-06 VITALS — BP 120/80 | HR 63 | Temp 97.3°F | Wt 303.0 lb

## 2019-08-06 DIAGNOSIS — G43009 Migraine without aura, not intractable, without status migrainosus: Secondary | ICD-10-CM

## 2019-08-06 MED ORDER — VERAPAMIL HCL ER 120 MG PO TBCR: 120.0000 mg | EXTENDED_RELEASE_TABLET | Freq: Every day | ORAL | 3 refills | Status: DC

## 2019-08-06 NOTE — Patient Instructions (Signed)
1.  Limit use of pain relievers to no more than 2 days out of week to prevent risk of rebound or medication-overuse headache. 2.  Keep headache diary 3.  Exercise, hydration, caffeine cessation, sleep hygiene, monitor for and avoid triggers 4.  Consider:  magnesium citrate 400mg daily, riboflavin 400mg daily, and coenzyme Q10 100mg three times daily   We may also treat TMJ if we think you have it If you are having frequent migraines we may put you on a once a day medication with fast acting medication to take. Also there is such a thing called rebound headache from over use of acute medications.  Please do not use rescue or acute medications more than 10 days a month or more than 3 days per week, this can cause a withdrawal and a rebound headache.  Here is more information below  Please remember, common headache triggers are: sleep deprivation, dehydration, overheating, stress, hypoglycemia or skipping meals and blood sugar fluctuations, excessive pain medications or excessive alcohol use or caffeine withdrawal. Some people have food triggers such as aged cheese, orange juice or chocolate, especially dark chocolate, or MSG (monosodium glutamate). Try to avoid these headache triggers as much possible.   It may be helpful to keep a headache diary to figure out what makes your headaches worse or brings them on and what alleviates them. Some people report headache onset after exercise but studies have shown that regular exercise may actually prevent headaches from coming. If you have exercise-induced headaches, please make sure that you drink plenty of fluid before and after exercising and that you do not over do it and do not overheat.   Please go to the ER if there is weakness, thunderclap headache, visual changes, or any concerning factors    Migraine Headache A migraine headache is an intense, throbbing pain on one or both sides of your head. Recurrent migraines keep coming back. A migraine  can last for 30 minutes to several hours. CAUSES  The exact cause of a migraine headache is not always known. However, a migraine may be caused when nerves in the brain become irritated and release chemicals that cause inflammation. This causes pain. Certain things may also trigger migraines, such as:   Alcohol.  Smoking.  Stress.  Menstruation.  Aged cheeses.  Foods or drinks that contain nitrates, glutamate, aspartame, or tyramine.  Lack of sleep.  Chocolate.  Caffeine.  Hunger.  Physical exertion.  Fatigue.  Medicines used to treat chest pain (nitroglycerine), birth control pills, estrogen, and some blood pressure medicines. SYMPTOMS   Pain on one or both sides of your head.  Pulsating or throbbing pain.  Severe pain that prevents daily activities.  Pain that is aggravated by any physical activity.  Nausea, vomiting, or both.  Dizziness.  Pain with exposure to bright lights, loud noises, or activity.  General sensitivity to bright lights, loud noises, or smells. Before you get a migraine, you may get warning signs that a migraine is coming (aura). An aura may include:  Seeing flashing lights.  Seeing bright spots, halos, or zigzag lines.  Having tunnel vision or blurred vision.  Having feelings of numbness or tingling.  Having trouble talking.  Having muscle weakness. DIAGNOSIS  A recurrent migraine headache is often diagnosed based on:  Symptoms.  Physical examination.  A CT scan or MRI of your head. These imaging tests cannot diagnose migraines but can help rule out other causes of headaches.   TREATMENT  Medicines may be given   for pain and nausea. Medicines can also be given to help prevent recurrent migraines. HOME CARE INSTRUCTIONS  Only take over-the-counter or prescription medicines for pain or discomfort as directed by your health care provider. The use of long-term narcotics is not recommended.  Lie down in a dark, quiet room when  you have a migraine.  Keep a journal to find out what may trigger your migraine headaches. For example, write down:  What you eat and drink.  How much sleep you get.  Any change to your diet or medicines.  Limit alcohol consumption.  Quit smoking if you smoke.  Get 7-9 hours of sleep, or as recommended by your health care provider.  Limit stress.  Keep lights dim if bright lights bother you and make your migraines worse. SEEK MEDICAL CARE IF:   You do not get relief from the medicines given to you.  You have a recurrence of pain.  You have a fever. SEEK IMMEDIATE MEDICAL CARE IF:  Your migraine becomes severe.  You have a stiff neck.  You have loss of vision.  You have muscular weakness or loss of muscle control.  You start losing your balance or have trouble walking.  You feel faint or pass out. . You have severe symptoms that are different from your first symptoms. MAKE SURE YOU:   Understand these instructions.  Will watch your condition.  Will get help right away if you are not doing well or get worse.   This information is not intended to replace advice given to you by your health care provider. Make sure you discuss any questions you have with your health care provider.   Document Released: 10/04/2000 Document Revised: 01/30/2014 Document Reviewed: 09/16/2012 Elsevier Interactive Patient Education 2016 [employer] Inc.  Common Migraine Triggers   Foods Aged cheese, alcohol, nuts, chocolate, yogurt, onions, figs, liver, caffeinated foods and beverages, monosodium glutamate (MSG), smoked or pickled fish/meat, nitrate/nitrate preserved foods (hotdogs, pepperoni, salami) tyramine  Medications Antibiotics (tetracycline, griseofulvin), antihypertensives (nifedipine, captopril), hormones (oral contraceptives, estrogens), histamine-2 blockers (cimetidine, raniidine, vasodilators (nitroglycerine, isosorbide dinitrate)  Sensory Stimuli Flickering/bright/fluorescent  lights, bright sunlight, odors (perfume, chemicals, cigarette smoke)  Lifestyle Changes Time zones, sleep patterns, eating habits, caffeine withdrawal stress  Other Menstrual cycle, weather/season/air pressure changes, high altitude  Adapted from Lewis and Solomon, [place]. Clin. J. Med. 1995; Rapoport and Sheftell. Conquering Headache, 1998  Hormonal variations also are believed to play a part.  Fluctuations of the female hormone estrogen (such as just before menstruation) affect a chemical called serotonin-when serotonin levels in the brain fall, the dilation (expansion) of blood vessels in the brain that is characteristic of migraine often follows.  Many factors or "triggers" can start a migraine.  In people who get migraines, most experts think certain activities or foods may trigger temporary changes in the blood vessels around the brain.  Swelling of these blood vessels may cause pain in the nearby nerves.  Allergy Headaches:  Hotdogs Milk  Onions  Thyme Bacon  Chocolate Garlic  Nutmeg Ham  Dark Cola Pork  Cinnamon Salami  Nuts  Egg  Ginger Sausage Red wine Cloves  Cheddar Cheese Caffeine  

## 2019-09-05 ENCOUNTER — Other Ambulatory Visit: Payer: Self-pay | Admitting: Physician Assistant

## 2019-09-18 ENCOUNTER — Ambulatory Visit: Payer: 59 | Admitting: Gastroenterology

## 2019-09-18 ENCOUNTER — Encounter: Payer: Self-pay | Admitting: Gastroenterology

## 2019-09-18 VITALS — BP 120/60 | HR 88 | Ht 64.25 in | Wt 300.5 lb

## 2019-09-18 DIAGNOSIS — K2970 Gastritis, unspecified, without bleeding: Secondary | ICD-10-CM

## 2019-09-18 DIAGNOSIS — K219 Gastro-esophageal reflux disease without esophagitis: Secondary | ICD-10-CM | POA: Diagnosis not present

## 2019-09-18 DIAGNOSIS — Z1211 Encounter for screening for malignant neoplasm of colon: Secondary | ICD-10-CM | POA: Diagnosis not present

## 2019-09-18 MED ORDER — SUCRALFATE 1 G PO TABS: 1.0000 g | ORAL_TABLET | Freq: Three times a day (TID) | ORAL | 3 refills | Status: AC

## 2019-09-18 NOTE — Progress Notes (Signed)
Emily Gay    [id number]    12-31-1970  Primary Care Physician:McKeown, Chrissie Noa, MD  Referring Physician: Lucky Cowboy, MD [address] [place],  [place] [address]   Chief complaint:  GERD, right upper quadrant abdominal pain  HPI:  49 year old very pleasant female here for follow-up visit for GERD and right upper quadrant abdominal discomfort.  She had severe episode of right-sided abdominal pain with no triggers in June of this year, she waited in the ER for few hours and left.  She got prescription for steroids and pain control through her primary doctor.  She called her office with persistent symptoms and dyspepsia, resend the prescription of Carafate which she feels relieved some of her symptoms and the pain slowly decreased in intensity and currently she is asymptomatic Denies any activity, sprains or lifting heavy weights.  No history of kidney stones  Denies any nausea, vomiting, abdominal pain, melena or bright red blood per rectum   No family history of GI malignancy  She is status post cholecystectomy for cholecystitis.   EGD 06/09/2016: showed mild gastritis otherwise unremarkable.  Abdominal ultrasound 04/02/2015: did not show any evidence of CBD obstruction.  MRI abdomen 06/07/2016: was unremarkable except for mild hepatic steatosis   Outpatient Encounter Medications as of 09/18/2019  Medication Sig  . carboxymethylcellulose 1 % ophthalmic solution Apply 1 drop to eye 3 (three) times daily.  . Cholecalciferol (VITAMIN D PO) Take 5,000 Units by mouth daily.   . fluticasone (FLONASE) 50 MCG/ACT nasal spray Place 2 sprays into both nostrils at bedtime.  Marland Kitchen levocetirizine (XYZAL) 5 MG tablet Take 1 tablet (5 mg total) by mouth daily.  . methocarbamol (ROBAXIN) 500 MG tablet TAKE 1 TABLET (500 MG TOTAL) BY MOUTH 3 (THREE) TIMES DAILY AS NEEDED FOR MUSCLE SPASMS.  . Multiple Vitamin (MULTIVITAMIN) capsule Take 1 capsule by mouth daily.    . pantoprazole (PROTONIX) 40 MG tablet Take 1 tablet Daily for Indigestion & Heartburn  . pregabalin (LYRICA) 150 MG capsule Take 1 capsule 3 x /day for Chronic Pain  . sucralfate (CARAFATE) 1 g tablet Take 1 tablet (1 g total) by mouth 4 (four) times daily -  with meals and at bedtime.  . SUMAtriptan (IMITREX) 50 MG tablet TAKE ONE TABLET AT ONSET OF HEADACHE; MAY REPEAT ONE TABLET IN 2 HOURS IF NEEDED IF HEADACHE PERSISTS OR RECURS  . verapamil (CALAN-SR) 120 MG CR tablet Take 1 tablet (120 mg total) by mouth daily.   No facility-administered encounter medications on file as of 09/18/2019.    Allergies as of 09/18/2019  . (No Known Allergies)    Past Medical History:  Diagnosis Date  . Allergic rhinitis   . Anemia   . Arthritis   . Chronic low back pain   . GERD (gastroesophageal reflux disease)   . Headache    migraine  . Hypertension   . 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 Obesity   . OSA on CPAP    does not use CPAP    Past Surgical History:  Procedure Laterality Date  . ANTERIOR LAT LUMBAR FUSION N/A 10/22/2014   Procedure: ANTERIOR LATERAL LUMBAR FUSION 1 LEVEL;  Surgeon: Estill Bamberg, MD;  Location: [facility] OR;  Service: Orthopedics;  Laterality: N/A;  . CHOLECYSTECTOMY  Feb. 2011   Gall Bladder  . ESOPHAGOGASTRODUODENOSCOPY (EGD) WITH PROPOFOL N/A 06/09/2016   Procedure: ESOPHAGOGASTRODUODENOSCOPY (EGD) WITH PROPOFOL;  Surgeon: Napoleon Form, MD;  Location:  [facility];  Service: Endoscopy;  Laterality: N/A;    Family History  Problem Relation Age of Onset  . Cancer Mother        lyphoma  . Lung disease Mother   . Arthritis Father   . Hypertension Father   . Barrett's esophagus Father   . Melanoma Sister   . Fibromyalgia Sister   . Mental illness Other        emotional illness (other blood relative)  . Stroke Other        Stroke (parent and grandparent)  . Heart disease Other        (grandparent)  . Hypertension Other        (other blood relative)  .  Arthritis Other        Parent and grandparent  . Colon cancer Neg Hx   . Rectal cancer Neg Hx   . Stomach cancer Neg Hx   . Esophageal cancer Neg Hx   . Liver cancer Neg Hx     Social History   Socioeconomic History  . Marital status: Married    Spouse name: Not on file  . Number of children: 0  . Years of education: college  . Highest education level: Not on file  Occupational History  . Occupation: Event organiser: [employer]  Tobacco Use  . Smoking status: Never Smoker  . Smokeless tobacco: Never Used  Vaping Use  . Vaping Use: Never used  Substance and Sexual Activity  . Alcohol use: Yes    Comment: occasionally  . Drug use: No  . Sexual activity: Not on file  Other Topics Concern  . 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   Social Determinants of Health   Financial Resource Strain:   . Difficulty of Paying Living Expenses: Not on file  Food Insecurity:   . Worried About Programme researcher, broadcasting/film/video in the Last Year: Not on file  . Ran Out of Food in the Last Year: Not on file  Transportation Needs:   . Lack of Transportation (Medical): Not on file  . Lack of Transportation (Non-Medical): Not on file  Physical Activity:   . Days of Exercise per Week: Not on file  . Minutes of Exercise per Session: Not on file  Stress:   . Feeling of Stress : Not on file  Social Connections:   . Frequency of Communication with Friends and Family: Not on file  . Frequency of Social Gatherings with Friends and Family: Not on file  . Attends Religious Services: Not on file  . Active Member of Clubs or Organizations: Not on file  . Attends Banker Meetings: Not on file  . Marital Status: Not on file  Intimate Partner Violence:   . Fear of Current or Ex-Partner: Not on  file  . Emotionally Abused: Not on file  . Physically Abused: Not on file  . Sexually Abused: Not on file      Review of systems: All other review of systems negative except as mentioned in the HPI.   Physical Exam: Vitals:   09/18/19 1043  BP: 120/60  Pulse: 88   Body mass index is 51.18 kg/m. Gen:      No acute distress HEENT:  sclera anicteric Abd:      soft, mild  right upper quadrant tenderness; palpable liver edge with increased liver span, no distension Ext:    No edema Neuro: alert and oriented x 3 Psych: normal mood and affect  Data Reviewed:  Reviewed labs, radiology imaging, old records and pertinent past GI work up   Assessment and Plan/Recommendations:  49 year old very pleasant female, morbid obesity, s/p cholecystectomy, hepatic steatosis and GERD  Right side abdominal pain, acute onset, unclear etiology Possible nephrolithiasis versus musculoskeletal Currently asymptomatic If has recurrent episode will consider CT abdomen pelvis with contrast for further evaluation  GERD: Continue Protonix daily Antireflux measures  Dyspepsia with history of gastritis: Use Carafate 1 g before meals and at bedtime as needed  Due for colorectal cancer screening, BMI greater than 58 will need to schedule at hospital endoscopy unit.  Patient said she will call back to schedule the procedure  Hepatic steatosis: Discussed exercise and dietary changes LFTs within normal range Avoid high carb and high fat diet  Return 1 year or sooner if needed  This visit required 40 minutes of patient care (this includes precharting, chart review, review of results, face-to-face time used for counseling as well as treatment plan and follow-up. The patient was provided an opportunity to ask questions and all were answered. The patient agreed with the plan and demonstrated an understanding of the instructions.  Iona Beard , MD    CC: Lucky Cowboy, MD

## 2019-09-18 NOTE — Patient Instructions (Addendum)
It has been recommended to you by your physician that you have a(n) colonoscopy completed. Per your request, we did not schedule the procedure(s) today. Please contact our office at [phone] should you decide to have the procedure completed. You will be scheduled for a pre-visit and procedure at that time.  We have sent carafate refills to your pharmacy  If you are age 47 or older, your body mass index should be between 23-30. Your Body mass index is 51.18 kg/m. If this is out of the aforementioned range listed, please consider follow up with your Primary Care Provider.  If you are age 83 or younger, your body mass index should be between 19-25. Your Body mass index is 51.18 kg/m. If this is out of the aformentioned range listed, please consider follow up with your Primary Care Provider.    Follow up as needed  I appreciate the  opportunity to care for you  Thank You   Marsa Aris , MD

## 2019-10-06 ENCOUNTER — Ambulatory Visit: Payer: 59 | Admitting: Neurology

## 2019-10-06 ENCOUNTER — Other Ambulatory Visit: Payer: Self-pay

## 2019-10-06 ENCOUNTER — Encounter: Payer: Self-pay | Admitting: Neurology

## 2019-10-06 VITALS — BP 148/88 | HR 92 | Ht 64.5 in | Wt 303.0 lb

## 2019-10-06 DIAGNOSIS — G4733 Obstructive sleep apnea (adult) (pediatric): Secondary | ICD-10-CM | POA: Diagnosis not present

## 2019-10-06 DIAGNOSIS — G2581 Restless legs syndrome: Secondary | ICD-10-CM | POA: Diagnosis not present

## 2019-10-06 DIAGNOSIS — Z6841 Body Mass Index (BMI) 40.0 and over, adult: Secondary | ICD-10-CM

## 2019-10-06 DIAGNOSIS — G4761 Periodic limb movement disorder: Secondary | ICD-10-CM

## 2019-10-06 DIAGNOSIS — F5102 Adjustment insomnia: Secondary | ICD-10-CM | POA: Insufficient documentation

## 2019-10-06 NOTE — Progress Notes (Signed)
SLEEP MEDICINE CLINIC    Provider:  Melvyn Novas, MD  Primary Care Physician:  Lucky Cowboy, MD [address] [place] [address]     Referring Provider: Lucky Cowboy, Md [address] [place],  [place] [address]          Chief Complaint according to patient   Patient presents with:    . New Patient (Initial Visit)     Has tried cpap in the past ( Seen by Cardiology, [place] [facility] 2014, and did not tolerate. Pt does endorse snoring.      HISTORY OF PRESENT ILLNESS:  Emily Gay is a 49 y.o. year old Caucasian female patient seen here upon a request for a sleep consultation  on 10/06/2019 from Dr. Anne Hahn, whom she considered her rimary neurologist,  and Quentin Mulling, NP.  Chief concern according to patient : First sleep in 2014, followed by CPAP initiation and since being no longer using CPAP since 2015- felt worse with the mask, couldnt sleep in lateral position with the given mask.  Having headaches in the daytime, sometimes in AM.    I have the pleasure of seeing Emily Gay today, a right handed Caucasian female with a possible sleep disorder. 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   The patient had the first sleep study in the year 2014,  with a result of an AHI was 47.4/h.  Actually her diagnostic polysomnogram confirmed only mild sleep apnea with an AHI of 9.8 RDI of 21.2 which indicates loud snoring to be present and REM sleep accentuated apnea at 47.4/h.  REM sleep latency was unusually long at 4 hours plus.  The patient spent 210 minutes in supine position which is not her not shoulder inflammation.  CPAP was titrated to 10 cmH2O the titration study scheduled does not mention the residual AHI's.  She did never reach REM sleep on that latest  pressure.  She was supposed to give him the [facility] fracture nasal mask the study was interpreted by Dr. Daphene Jaeger.   Sleep relevant medical history: Nocturia: 1-2 , no Tonsillectomy,no cervical spine trauma or surgery/no  deviated septum repair.    Family medical /sleep history: father with OSA, father has  insomnia, no sleep walkers.    Social history:  Patient is working as a Environmental education officer , city of [employer]-  and lives in a household with spouse and 4 cats -  The patient currently works in an office setting, not from home.  Pets are present. She has 4 cats and is allergic to cats . Tobacco UJW:JXBJ. ETOH use ; socially , 1-2 drinks in a month.  Caffeine intake in form of Coffee( 2-3 in AM ) Soda( 2 16 ounces  in PM); no energy drinks. Regular exercise : none .     Sleep habits are as follows: she is often working late - there is great variability The patient's dinner time is between 6-7 PM. The patient goes to bed at 10 PM and continues to struggle to sleep for 1 hour, once asleep , she sleeps 6 hours - sometimes wakes for 1-2 bathroom breaks, the first time at 2-3 AM.   The preferred sleep position is on her side , with the support of 1-2 pillows. Dreams are reportedly frequent but not  vivid.  Mo- Fri  5.30  AM is the usual rise time. The patient wakes up 4.30 - 5 AM with an alarm.  She reports not feeling refreshed or restored in AM, with symptoms such as dry mouth, morning headaches, and residual fatigue.  Naps are taken infrequently,  Even rare, lasting from 60-120 minutes and are more refreshing than nocturnal sleep.  She is unable to "power nap".   History from 12/ 2015:Emily Gay is a 49 year old year old female with a history of sleep apnea, myoclonic jerks and headaches. She returns today for follow-up. The patient had an EEG that was unremarkable. She is currently taking lyrica. Patient reports that her headaches has improved. The myclonic jerks have stayed the same. She reports  that she has approximately 5 episodes of jerking a week. Her husband states that she was sometimes do before bedtime but the patient is unaware of this. Overall the patient states that she has learned to live with this. She does have a history of obstructive sleep apnea and was using a CPAP machine.  She states that she could not tolerate the CPAP Mask therefore she is no longer using the machine. She does state that she would like to have another sleep study with a another physician. The patient states that she has low back pain due to a disk issue. She sees an orthopedist for this. She states that this pain causes weakness and numbness in the lower legs. She has tried conservative therapy without benefit. She is considering surgical options.   Review of Systems: Out of a complete 14 system review, the patient complains of only the following symptoms, and all other reviewed systems are negative.:  Fatigue, sleepiness , snoring, fragmented sleep, Insomnia yes. RLS , worried, feeling that her mind is racing.    How likely are you to doze in the following situations: 0 = not likely, 1 = slight chance, 2 = moderate chance, 3 = high chance   Sitting and Reading? Watching Television? Sitting inactive in a public place (theater or meeting)? As a passenger in a car for an hour without a break? Lying down in the afternoon when circumstances permit? Sitting and talking to someone? Sitting quietly after lunch without alcohol? In a car, while stopped for a few minutes in traffic?   Total = 3/ 24 points   FSS endorsed at 52/ 63 points.   Social History   Socioeconomic History  . Marital status: Married    Spouse name: Not on file  . Number of children: 0  . Years of education: college  . Highest education level: Not on file  Occupational History  . Occupation: Event organiser: [employer]  Tobacco Use  . Smoking status: Never Smoker  . Smokeless tobacco: Never Used  Vaping Use    . Vaping Use: Never used  Substance and Sexual Activity  . Alcohol use: Yes    Comment: occasionally  . Drug use: No  . Sexual activity: Not on file  Other Topics Concern  . 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   Social Determinants of Health   Financial Resource Strain:   . Difficulty of Paying Living Expenses: Not on file  Food Insecurity:   . Worried About Programme researcher, broadcasting/film/video in the Last Year: Not  on file  . Ran Out of Food in the Last Year: Not on file  Transportation Needs:   . Lack of Transportation (Medical): Not on file  . Lack of Transportation (Non-Medical): Not on file  Physical Activity:   . Days of Exercise per Week: Not on file  . Minutes of Exercise per Session: Not on file  Stress:   . Feeling of Stress : Not on file  Social Connections:   . Frequency of Communication with Friends and Family: Not on file  . Frequency of Social Gatherings with Friends and Family: Not on file  . Attends Religious Services: Not on file  . Active Member of Clubs or Organizations: Not on file  . Attends Banker Meetings: Not on file  . Marital Status: Not on file    Family History  Problem Relation Age of Onset  . Cancer Mother        lyphoma  . Lung disease Mother   . Arthritis Father   . Hypertension Father   . Barrett's esophagus Father   . Melanoma Sister   . Fibromyalgia Sister   . Mental illness Other        emotional illness (other blood relative)  . Stroke Other        Stroke (parent and grandparent)  . Heart disease Other        (grandparent)  . Hypertension Other        (other blood relative)  . Arthritis Other        Parent and grandparent  . Colon cancer Neg Hx   . Rectal cancer Neg Hx   . Stomach cancer Neg Hx   . Esophageal cancer Neg Hx   .  Liver cancer Neg Hx     Past Medical History:  Diagnosis Date  . Allergic rhinitis   . Anemia   . Arthritis   . Chronic low back pain   . GERD (gastroesophageal reflux disease)   . Headache    migraine  . Hypertension   . 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 Obesity   . OSA on CPAP    does not use CPAP    Past Surgical History:  Procedure Laterality Date  . ANTERIOR LAT LUMBAR FUSION N/A 10/22/2014   Procedure: ANTERIOR LATERAL LUMBAR FUSION 1 LEVEL;  Surgeon: Estill Bamberg, MD;  Location: [facility] OR;  Service: Orthopedics;  Laterality: N/A;  . CHOLECYSTECTOMY  Feb. 2011   Gall Bladder  . ESOPHAGOGASTRODUODENOSCOPY (EGD) WITH PROPOFOL N/A 06/09/2016   Procedure: ESOPHAGOGASTRODUODENOSCOPY (EGD) WITH PROPOFOL;  Surgeon: Napoleon Form, MD;  Location: [facility] ENDOSCOPY;  Service: Endoscopy;  Laterality: N/A;     Current Outpatient Medications on File Prior to Visit  Medication Sig Dispense Refill  . carboxymethylcellulose 1 % ophthalmic solution Apply 1 drop to eye 3 (three) times daily. 30 mL 12  . Cholecalciferol (VITAMIN D PO) Take 5,000 Units by mouth daily.     . fluticasone (FLONASE) 50 MCG/ACT nasal spray Place 2 sprays into both nostrils at bedtime. 16 g 3  . levocetirizine (XYZAL) 5 MG tablet Take 1 tablet (5 mg total) by mouth daily. 90 tablet 1  . methocarbamol (ROBAXIN) 500 MG tablet TAKE 1 TABLET (500 MG TOTAL) BY MOUTH 3 (THREE) TIMES DAILY AS NEEDED FOR MUSCLE SPASMS. 90 tablet 1  . Multiple Vitamin (MULTIVITAMIN) capsule Take 1 capsule by mouth daily.    . pantoprazole (PROTONIX) 40 MG tablet Take 1 tablet Daily for Indigestion & Heartburn 90 tablet  3  . pregabalin (LYRICA) 150 MG capsule Take 1 capsule 3 x /day for Chronic Pain 270 capsule 0  . sucralfate (CARAFATE) 1 g tablet Take 1 tablet (1 g total) by mouth 4 (four) times daily -  with meals and at bedtime. 120 tablet 3  . SUMAtriptan (IMITREX) 50 MG tablet TAKE ONE TABLET AT ONSET OF HEADACHE; MAY REPEAT ONE TABLET IN  2 HOURS IF NEEDED IF HEADACHE PERSISTS OR RECURS 10 tablet 2   No current facility-administered medications on file prior to visit.   Physical exam:  Today's Vitals   10/06/19 0850  BP: (!) 148/88  Pulse: 92  Weight: (!) 303 lb (137.4 kg)  Height: 5' 4.5" (1.638 m)   Body mass index is 51.21 kg/m.   Wt Readings from Last 3 Encounters:  10/06/19 (!) 303 lb (137.4 kg)  09/18/19 (!) 300 lb 8 oz (136.3 kg)  08/06/19 (!) 303 lb (137.4 kg)     Ht Readings from Last 3 Encounters:  10/06/19 5' 4.5" (1.638 m)  09/18/19 5' 4.25" (1.632 m)  07/16/19 5\' 4"  (1.626 m)      General: The patient is awake, alert and appears not in acute distress. The patient is well groomed. Head: Normocephalic, atraumatic. Neck is supple. Mallampati 2,  neck circumference:17.25 inches . Nasal airflow  patent.  Retrognathia is seen.  Dental status: Small and crowded lower jaw, never worn braces.  Cardiovascular:  Regular rate and cardiac rhythm by pulse,  without distended neck veins. Respiratory: Lungs are clear to auscultation.  Skin:  With evidence of ankle edema- mild, non pitting.  Trunk: The patient's posture is erect.   Neurologic exam : The patient is awake and alert, oriented to place and time.   Memory subjective described as intact.  Attention span & concentration ability appears normal.  Speech is fluent,  without  dysarthria, dysphonia or aphasia.  Mood and affect are appropriate.   Cranial nerves: no loss of smell or taste reported  Pupils are equal and briskly reactive to light. Funduscopic exam deferred. Trouble seeing at night. Eye fatigue, dry eyes.   Extraocular movements in vertical and horizontal planes were intact and without nystagmus. No Diplopia. Visual fields by finger perimetry are intact. Hearing was intact to soft voice and finger rubbing.    Facial sensation intact to fine touch.  Facial motor strength is symmetric and tongue and uvula move midline.  Neck ROM :  rotation, tilt and flexion extension were normal for age and shoulder shrug was symmetrical.    Motor exam:  Symmetric bulk, tone and ROM.   Normal tone without cog- wheeling, symmetric grip strength .   Sensory:  Fine touch, pinprick and vibration were  normal.  Proprioception tested in the upper extremities was normal.   Coordination: Rapid alternating movements in the fingers/hands were of normal speed.  The Finger-to-nose maneuver was intact without evidence of ataxia, dysmetria or tremor.   Gait and station: Patient could rise unassisted from a seated position, walked without assistive device.  Stance is of normal width/ base and the patient turned with 3 steps RN observation) .  Toe and heel walk were deferred.  Deep tendon reflexes: in the upper and lower extremities are symmetric and intact.  Babinski response was deferred.     CONSULTATION -   After spending a total time of of 45 minutes face to face  And time for physical and neurologic examination, review of laboratory studies,  personal review of imaging  studies, reports and results of other testing and review of referral information / records as far as provided in visit, I have established the following assessments:  1) Snoring, Obesity  ( BMI is 51.20 ), and retrognathia all are risk factors for OSA. In 2014 , her AHI was mild, but strongly REM dependent  2) twitching and jerking has been present for many years, lyrica has helped.  3) Nurtec and triptans have also helped with migraine and with some of the trembling. Nurtec was denied by insurance, she has failed Verapamil, has not tried beta blockers, depakote or topamax yet.      My Plan is to proceed with:  1) repeat sleep study should be an attended sleep study due to headaches, risk of obesity hypoventilation, and RLS complaint. 2) at this time continue LYRICA and add electrolytes to your diet.    I would like to thank Lucky Cowboy, MD and Quentin Mulling, NP ,  at [address] [place],  [place] [address] for allowing me to meet with and to take care of this pleasant patient.  CC: I will share my notes with PCP.  Electronically signed by: Melvyn Novas, MD 10/06/2019 9:01 AM  [facility] Board certified by The [employer] of Sleep Medicine and Diplomate of the [employer] of Sleep Medicine. Board certified In Neurology through the ABPN, Fellow of the [employer] of Neurology. Medical Director of [facility].

## 2019-10-06 NOTE — Patient Instructions (Signed)
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 In 2014 , her AHI was mild, but strongly REM dependent  2) twitching and jerking has been present for many years, lyrica has helped.  3) Nurtec and triptans have also helped with migraine and with some of the trembling. Nurtec was denied by insurance, she has failed Verapamil, has not tried beta blockers, depakote or topamax yet.      My Plan is to proceed with:  1) repeat sleep study should be an attended sleep study due to headaches, risk of obesity hypoventilation, and RLS complaint. 2) at this time continue LYRICA and add electrolytes to the daily.   Insomnia Insomnia is a sleep disorder that makes it difficult to fall asleep or stay asleep. Insomnia can cause fatigue, low energy, difficulty concentrating, mood swings, and poor performance at work or school. There are three different ways to classify insomnia:  Difficulty falling asleep.  Difficulty staying asleep.  Waking up too early in the morning. Any type of insomnia can be long-term (chronic) or short-term (acute). Both are common. Short-term insomnia usually lasts for three months or less. Chronic insomnia occurs at least three times a week for longer than three months. What are the causes? Insomnia may be caused by another condition, situation, or substance, such as:  Anxiety.  Certain medicines.  Gastroesophageal reflux disease (GERD) or other gastrointestinal conditions.  Asthma or other breathing conditions.  Restless legs syndrome, sleep apnea, or other sleep disorders.  Chronic pain.  Menopause.  Stroke.  Abuse of alcohol, tobacco, or illegal drugs.  Mental health conditions, such as depression.  Caffeine.  Neurological disorders, such as Alzheimer's disease.  An overactive thyroid (hyperthyroidism). Sometimes, the cause of insomnia may not be known. What increases the risk? Risk factors for insomnia  include:  Gender. Women are affected more often than men.  Age. Insomnia is more common as you get older.  Stress.  Lack of exercise.  Irregular work schedule or working night shifts.  Traveling between different time zones.  Certain medical and mental health conditions. What are the signs or symptoms? If you have insomnia, the main symptom is having trouble falling asleep or having trouble staying asleep. This may lead to other symptoms, such as:  Feeling fatigued or having low energy.  Feeling nervous about going to sleep.  Not feeling rested in the morning.  Having trouble concentrating.  Feeling irritable, anxious, or depressed. How is this diagnosed? This condition may be diagnosed based on:  Your symptoms and medical history. Your health care provider may ask about: ? Your sleep habits. ? Any medical conditions you have. ? Your mental health.  A physical exam. How is this treated? Treatment for insomnia depends on the cause. Treatment may focus on treating an underlying condition that is causing insomnia. Treatment may also include:  Medicines to help you sleep.  Counseling or therapy.  Lifestyle adjustments to help you sleep better. Follow these instructions at home: Eating and drinking   Limit or avoid alcohol, caffeinated beverages, and cigarettes, especially close to bedtime. These can disrupt your sleep.  Do not eat a large meal or eat spicy foods right before bedtime. This can lead to digestive discomfort that can make it hard for you to sleep. Sleep habits   Keep a sleep diary to help you and your health care provider figure out what could be causing your insomnia. Write down: ? When you sleep. ? When  you wake up during the night. ? How well you sleep. ? How rested you feel the next day. ? Any side effects of medicines you are taking. ? What you eat and drink.  Make your bedroom a dark, comfortable place where it is easy to fall  asleep. ? Put up shades or blackout curtains to block light from outside. ? Use a white noise machine to block noise. ? Keep the temperature cool.  Limit screen use before bedtime. This includes: ? Watching TV. ? Using your smartphone, tablet, or computer.  Stick to a routine that includes going to bed and waking up at the same times every day and night. This can help you fall asleep faster. Consider making a quiet activity, such as reading, part of your nighttime routine.  Try to avoid taking naps during the day so that you sleep better at night.  Get out of bed if you are still awake after 15 minutes of trying to sleep. Keep the lights down, but try reading or doing a quiet activity. When you feel sleepy, go back to bed. General instructions  Take over-the-counter and prescription medicines only as told by your health care provider.  Exercise regularly, as told by your health care provider. Avoid exercise starting several hours before bedtime.  Use relaxation techniques to manage stress. Ask your health care provider to suggest some techniques that may work well for you. These may include: ? Breathing exercises. ? Routines to release muscle tension. ? Visualizing peaceful scenes.  Make sure that you drive carefully. Avoid driving if you feel very sleepy.  Keep all follow-up visits as told by your health care provider. This is important. Contact a health care provider if:  You are tired throughout the day.  You have trouble in your daily routine due to sleepiness.  You continue to have sleep problems, or your sleep problems get worse. Get help right away if:  You have serious thoughts about hurting yourself or someone else. If you ever feel like you may hurt yourself or others, or have thoughts about taking your own life, get help right away. You can go to your nearest emergency department or call:  Your local emergency services (911 in the [place]  A suicide crisis  helpline, such as the National Suicide Prevention Lifeline at (828) 462-7691. This is open 24 hours a day. Summary  Insomnia is a sleep disorder that makes it difficult to fall asleep or stay asleep.  Insomnia can be long-term (chronic) or short-term (acute).  Treatment for insomnia depends on the cause. Treatment may focus on treating an underlying condition that is causing insomnia.  Keep a sleep diary to help you and your health care provider figure out what could be causing your insomnia. This information is not intended to replace advice given to you by your health care provider. Make sure you discuss any questions you have with your health care provider. Document Revised: 12/22/2016 Document Reviewed: 10/19/2016 Elsevier Patient Education  2020 [employer]  Quality Sleep Information, Adult Quality sleep is important for your mental and physical health. It also improves your quality of life. Quality sleep means you:  Are asleep for most of the time you are in bed.  Fall asleep within 30 minutes.  Wake up no more than once a night.  Are awake for no longer than 20 minutes if you do wake up during the night. Most adults need 7-8 hours of quality sleep each night. How can poor sleep affect me?  If you do not get enough quality sleep, you may have:  Mood swings.  Daytime sleepiness.  Confusion.  Decreased reaction time.  Sleep disorders, such as insomnia and sleep apnea.  Difficulty with: ? Solving problems. ? Coping with stress. ? Paying attention. These issues may affect your performance and productivity at work, school, and at home. Lack of sleep may also put you at higher risk for accidents, suicide, and risky behaviors. If you do not get quality sleep you may also be at higher risk for several health problems, including:  Infections.  Type 2 diabetes.  Heart disease.  High blood pressure.  Obesity.  Worsening of long-term conditions, like arthritis,  kidney disease, depression, Parkinson's disease, and epilepsy. What actions can I take to get more quality sleep?      Stick to a sleep schedule. Go to sleep and wake up at about the same time each day. Do not try to sleep less on weekdays and make up for lost sleep on weekends. This does not work.  Try to get about 30 minutes of exercise on most days. Do not exercise 2-3 hours before going to bed.  Limit naps during the day to 30 minutes or less.  Do not use any products that contain nicotine or tobacco, such as cigarettes or e-cigarettes. If you need help quitting, ask your health care provider.  Do not drink caffeinated beverages for at least 8 hours before going to bed. Coffee, tea, and some sodas contain caffeine.  Do not drink alcohol close to bedtime.  Do not eat large meals close to bedtime.  Do not take naps in the late afternoon.  Try to get at least 30 minutes of sunlight every day. Morning sunlight is best.  Make time to relax before bed. Reading, listening to music, or taking a hot bath promotes quality sleep.  Make your bedroom a place that promotes quality sleep. Keep your bedroom dark, quiet, and at a comfortable room temperature. Make sure your bed is comfortable. Take out sleep distractions like TV, a computer, smartphone, and bright lights.  If you are lying awake in bed for longer than 20 minutes, get up and do a relaxing activity until you feel sleepy.  Work with your health care provider to treat medical conditions that may affect sleeping, such as: ? Nasal obstruction. ? Snoring. ? Sleep apnea and other sleep disorders.  Talk to your health care provider if you think any of your prescription medicines may cause you to have difficulty falling or staying asleep.  If you have sleep problems, talk with a sleep consultant. If you think you have a sleep disorder, talk with your health care provider about getting evaluated by a specialist. Where to find more  information  National Sleep Foundation website: https://sleepfoundation.org  National Heart, Lung, and Blood Institute (NHLBI): https://hall.info/www.nhlbi.nih.gov/files/docs/public/sleep/healthy_sleep.pdf  Centers for Disease Control and Prevention (CDC): DetailSports.iswww.cdc.gov/sleep/index.html Contact a health care provider if you:  Have trouble getting to sleep or staying asleep.  Often wake up very early in the morning and cannot get back to sleep.  Have daytime sleepiness.  Have daytime sleep attacks of suddenly falling asleep and sudden muscle weakness (narcolepsy).  Have a tingling sensation in your legs with a strong urge to move your legs (restless legs syndrome).  Stop breathing briefly during sleep (sleep apnea).  Think you have a sleep disorder or are taking a medicine that is affecting your quality of sleep. Summary  Most adults need 7-8 hours of quality  sleep each night.  Getting enough quality sleep is an important part of health and well-being.  Make your bedroom a place that promotes quality sleep and avoid things that may cause you to have poor sleep, such as alcohol, caffeine, smoking, and large meals.  Talk to your health care provider if you have trouble falling asleep or staying asleep. This information is not intended to replace advice given to you by your health care provider. Make sure you discuss any questions you have with your health care provider. Document Revised: 04/18/2017 Document Reviewed: 04/18/2017 Elsevier Patient Education  2020 [employer].  diet.

## 2019-10-09 ENCOUNTER — Telehealth: Payer: Self-pay

## 2019-10-09 NOTE — Telephone Encounter (Signed)
LVM for pt to call me back to schedule sleep study  

## 2019-10-14 ENCOUNTER — Telehealth: Payer: Self-pay

## 2019-10-14 NOTE — Telephone Encounter (Signed)
LVM for pt to call me back to schedule sleep study  

## 2019-10-21 ENCOUNTER — Ambulatory Visit: Payer: 59 | Admitting: Neurology

## 2019-10-21 ENCOUNTER — Encounter: Payer: Self-pay | Admitting: Neurology

## 2019-10-21 DIAGNOSIS — G43119 Migraine with aura, intractable, without status migrainosus: Secondary | ICD-10-CM | POA: Diagnosis not present

## 2019-10-21 HISTORY — DX: Migraine with aura, intractable, without status migrainosus: G43.119

## 2019-10-21 MED ORDER — SUMATRIPTAN SUCCINATE 100 MG PO TABS: 100.0000 mg | ORAL_TABLET | Freq: Two times a day (BID) | ORAL | 3 refills | Status: DC | PRN

## 2019-10-21 MED ORDER — TOPIRAMATE 25 MG PO TABS: ORAL_TABLET | ORAL | 3 refills | Status: DC

## 2019-10-21 NOTE — Progress Notes (Signed)
Reason for visit: Migraine headache  Referring physician: Dr. Elita Quick Emily Gay is a 49 y.o. female  History of present illness:  Emily Gay is a 50 year old right-handed white female with a history of migraine headaches that dates back about 10 years.  The patient has had unusual symptoms with her migraine, they initially presented without headache with episodes of confusion and speech difficulty.  The patient was worked up through this office in 2015 for these issues.  The patient will have onset of cognitive changes and increased tremors with occasional jerking that may be worse at night and during sleep.  She has difficulty generating language, she can understand language fairly well but feels like she is in a dream.  She reports photophobia and phonophobia with the events.  The patient may have electrical shock sensations.  More recently over the last 2 years she will get a headache with the above episodes.  She has been treated with Lyrica which seem to help initially, she was given a trial on Emgality without benefit, and a trial on verapamil.  She has taken Nurtec with good benefit but the insurance company would not pay for the medication.  Imitrex works but she needs more than 10 tablets a month for the headaches.  The patient may have 2 or 3 such events a week at this point, sometimes she has some cognitive issues that may last 2 days but usually the entire event improves after several hours.  Sleep may help some, she may have a hangover feeling the next day.  Stress and lack of sleep and menstrual cycle will worsen the events.  She does have some neck discomfort at times.  She drinks 2 to 3 cups of caffeinated beverages daily.  She is sent to this office for an evaluation.  Past Medical History:  Diagnosis Date  . Allergic rhinitis   . Anemia   . Arthritis   . Chronic low back pain   . GERD (gastroesophageal reflux disease)   . Headache    migraine  . Hypertension   .  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 Obesity   . OSA on CPAP    does not use CPAP    Past Surgical History:  Procedure Laterality Date  . ANTERIOR LAT LUMBAR FUSION N/A 10/22/2014   Procedure: ANTERIOR LATERAL LUMBAR FUSION 1 LEVEL;  Surgeon: Estill Bamberg, MD;  Location: [facility] OR;  Service: Orthopedics;  Laterality: N/A;  . CHOLECYSTECTOMY  Feb. 2011   Gall Bladder  . ESOPHAGOGASTRODUODENOSCOPY (EGD) WITH PROPOFOL N/A 06/09/2016   Procedure: ESOPHAGOGASTRODUODENOSCOPY (EGD) WITH PROPOFOL;  Surgeon: Napoleon Form, MD;  Location: [facility];  Service: Endoscopy;  Laterality: N/A;    Family History  Problem Relation Age of Onset  . Cancer Mother        lyphoma  . Lung disease Mother   . Arthritis Father   . Hypertension Father   . Barrett's esophagus Father   . Melanoma Sister   . Fibromyalgia Sister   . Mental illness Other        emotional illness (other blood relative)  . Stroke Other        Stroke (parent and grandparent)  . Heart disease Other        (grandparent)  . Hypertension Other        (other blood relative)  . Arthritis Other        Parent and grandparent  . Colon cancer Neg Hx   . Rectal  cancer Neg Hx   . Stomach cancer Neg Hx   . Esophageal cancer Neg Hx   . Liver cancer Neg Hx     Social history:  reports that she has never smoked. She has never used smokeless tobacco. She reports current alcohol use. She reports that she does not use drugs.  Medications:  Prior to Admission medications   Medication Sig Start Date End Date Taking? Authorizing Provider  carboxymethylcellulose 1 % ophthalmic solution Apply 1 drop to eye 3 (three) times daily. 04/03/19  Yes Lamptey, Britta Mccreedy, MD  Cholecalciferol (VITAMIN D PO) Take 5,000 Units by mouth daily.    Yes [provider]  fluticasone (FLONASE) 50 MCG/ACT nasal spray Place 2 sprays into both nostrils at bedtime. 10/24/18  Yes Quentin Mulling, PA-C  levocetirizine (XYZAL) 5 MG tablet Take 1 tablet (5 mg  total) by mouth daily. 10/24/18  Yes Quentin Mulling, PA-C  methocarbamol (ROBAXIN) 500 MG tablet TAKE 1 TABLET (500 MG TOTAL) BY MOUTH 3 (THREE) TIMES DAILY AS NEEDED FOR MUSCLE SPASMS. 10/23/17  Yes Quentin Mulling, PA-C  Multiple Vitamin (MULTIVITAMIN) capsule Take 1 capsule by mouth daily.   Yes [provider]  pantoprazole (PROTONIX) 40 MG tablet Take 1 tablet Daily for Indigestion & Heartburn 01/24/19  Yes Lucky Cowboy, MD  pregabalin (LYRICA) 150 MG capsule Take 1 capsule 3 x /day for Chronic Pain 09/06/19  Yes Lucky Cowboy, MD  SUMAtriptan (IMITREX) 50 MG tablet TAKE ONE TABLET AT ONSET OF HEADACHE; MAY REPEAT ONE TABLET IN 2 HOURS IF NEEDED IF HEADACHE PERSISTS OR RECURS 07/17/19  Yes Lucky Cowboy, MD  sucralfate (CARAFATE) 1 g tablet Take 1 tablet (1 g total) by mouth 4 (four) times daily -  with meals and at bedtime. 09/18/19 10/18/19  Napoleon Form, MD     No Known Allergies  ROS:  Out of a complete 14 system review of symptoms, the patient complains only of the following symptoms, and all other reviewed systems are negative.  Headache Confusion  Blood pressure 134/61, pulse 93, height 5' 4.5" (1.638 m), weight (!) 301 lb (136.5 kg).  Physical Exam  General: The patient is alert and cooperative at the time of the examination.  The patient is markedly obese.  Eyes: Pupils are equal, round, and reactive to light. Discs are flat bilaterally.  Neck: The neck is supple, no carotid bruits are noted.  Respiratory: The respiratory examination is clear.  Cardiovascular: The cardiovascular examination reveals a regular rate and rhythm, no obvious murmurs or rubs are noted.  Skin: Extremities are without significant edema.  Neurologic Exam  Mental status: The patient is alert and oriented x 3 at the time of the examination. The patient has apparent normal recent and remote memory, with an apparently normal attention span and concentration ability.  Cranial  nerves: Facial symmetry is present. There is good sensation of the face to pinprick and soft touch bilaterally. The strength of the facial muscles and the muscles to head turning and shoulder shrug are normal bilaterally. Speech is well enunciated, no aphasia or dysarthria is noted. Extraocular movements are full. Visual fields are full. The tongue is midline, and the patient has symmetric elevation of the soft palate. No obvious hearing deficits are noted.  Motor: The motor testing reveals 5 over 5 strength of all 4 extremities. Good symmetric motor tone is noted throughout.  Sensory: Sensory testing is intact to pinprick, soft touch, vibration sensation, and position sense on all 4 extremities. No evidence  of extinction is noted.  Coordination: Cerebellar testing reveals good finger-nose-finger and heel-to-shin bilaterally.  Gait and station: Gait is normal. Tandem gait is normal. Romberg is negative. No drift is seen.  Reflexes: Deep tendon reflexes are symmetric and normal bilaterally. Toes are downgoing bilaterally.   Assessment/Plan:  1.  Migraine headache, intractable  The patient is having a lot of cognitive changes with the migraine aura.  The patient may miss work because of the headache.  She is having 10 to 12 days with headache symptoms each month.  She will be given a trial on Topamax starting at 25 mg and working up to 75 mg at night.  We will convert her to the 100 mg Imitrex tablets.  She will follow up here in about 3 months.  She will call for any dose adjustments of medication.  Marlan Palau MD 10/21/2019 8:38 AM  [facility] [address] [place], [place] [address]  Phone [phone] Fax [phone]

## 2019-10-23 ENCOUNTER — Telehealth: Payer: Self-pay

## 2019-10-23 NOTE — Telephone Encounter (Signed)
LVM for pt to call me back to schedule sleep study  

## 2019-10-27 ENCOUNTER — Encounter: Payer: Self-pay | Admitting: Physician Assistant

## 2019-11-03 ENCOUNTER — Other Ambulatory Visit: Payer: Self-pay | Admitting: Internal Medicine

## 2019-11-03 MED ORDER — VERAPAMIL HCL ER 120 MG PO TBCR: EXTENDED_RELEASE_TABLET | ORAL | 0 refills | Status: DC

## 2019-11-13 ENCOUNTER — Encounter: Payer: Self-pay | Admitting: Adult Health

## 2019-12-17 ENCOUNTER — Other Ambulatory Visit: Payer: Self-pay | Admitting: Internal Medicine

## 2019-12-17 MED ORDER — PREGABALIN 150 MG PO CAPS
ORAL_CAPSULE | ORAL | 0 refills | Status: DC
Start: 2019-12-17 — End: 2020-03-11

## 2019-12-23 ENCOUNTER — Encounter: Payer: Self-pay | Admitting: Internal Medicine

## 2019-12-23 NOTE — Patient Instructions (Signed)

## 2019-12-23 NOTE — Progress Notes (Signed)
Annual Screening/Preventative Visit & Comprehensive Evaluation &  Examination      This very nice 49 y.o.  MWF presents for a Screening /Preventative Visit & comprehensive evaluation and management of multiple medical co-morbidities.  Patient has been followed for  Labile HTN, HLD, Prediabetes  and Vitamin D Deficiency. Patient has GERD controlled on her meds. Patient is followed by Dr Anne Hahn for Migraine and by Dr Vickey Huger for OSA (but she's mask intolerant).        HTN predates since  2014. Patient's BP has been controlled at home and patient denies any cardiac symptoms as chest pain, palpitations, shortness of breath, dizziness or ankle swelling. Today's BP is at goal -  122/86.       Patient's hyperlipidemia is controlled with diet. Last lipids were at goal:  Lab Results  Component Value Date   CHOL 162 03/03/2019   HDL 48 (L) 03/03/2019   LDLCALC 89 03/03/2019   TRIG 152 (H) 03/03/2019   CHOLHDL 3.4 03/03/2019       Patient has hx/o  Morbid Obesity (BMI 51+ )  and consequent PreDiabetes /Insulin Resistance (A1c of 5.6% / insulin 124 /2015 ) and patient denies reactive hypoglycemic symptoms, visual blurring, diabetic polys or paresthesias. Last A1c was not at goal:   Lab Results  Component Value Date   HGBA1C 6.0 (H) 03/03/2019       Finally, patient has history of Vitamin D Deficiency ("34" /2014) and last Vitamin D was still very low:  Lab Results  Component Value Date   VD25OH 33 03/03/2019    Current Outpatient Medications on File Prior to Visit  Medication Sig  . carboxymethylcellulose 1 % ophthalmic solution Apply 1 drop to eye 3 (three) times daily.  . Cholecalciferol (VITAMIN D PO) Take 5,000 Units by mouth daily.   . fluticasone (FLONASE) 50 MCG/ACT nasal spray Place 2 sprays into both nostrils at bedtime.  Marland Kitchen levocetirizine (XYZAL) 5 MG tablet Take 1 tablet (5 mg total) by mouth daily.  . methocarbamol (ROBAXIN) 500 MG tablet TAKE 1 TABLET (500 MG TOTAL) BY  MOUTH 3 (THREE) TIMES DAILY AS NEEDED FOR MUSCLE SPASMS.  . Multiple Vitamin (MULTIVITAMIN) capsule Take 1 capsule by mouth daily.  . pantoprazole (PROTONIX) 40 MG tablet Take 1 tablet Daily for Indigestion & Heartburn  . pregabalin (LYRICA) 150 MG capsule Take      1 capsule     3 x /day      for Chronic Pain  . sucralfate (CARAFATE) 1 g tablet Take 1 tablet (1 g total) by mouth 4 (four) times daily -  with meals and at bedtime.  . SUMAtriptan (IMITREX) 100 MG tablet Take 1 tablet (100 mg total) by mouth 2 (two) times daily as needed for up to 1 dose for migraine.  . topiramate (TOPAMAX) 25 MG tablet Take one tablet at night for one week, then take 2 tablets at night for one week, then take 3 tablets at night.  . verapamil (CALAN-SR) 120 MG CR tablet Take    1    Tablet     Daily       with Food for Migraine Prevention     No Known Allergies  Past Medical History:  Diagnosis Date  . Allergic rhinitis   . Anemia   . Arthritis   . Chronic low back pain   . GERD (gastroesophageal reflux disease)   . Headache    migraine  . Hypertension   . Intractable migraine  with aura without status migrainosus 10/21/2019  . 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 Obesity   . OSA on CPAP    does not use CPAP   Health Maintenance  Topic Date Due  . COVID-19 Vaccine (1) Never done  . HIV Screening  Never done  . INFLUENZA VACCINE  08/24/2019  . PAP SMEAR-Modifier  10/23/2020  . TETANUS/TDAP  08/22/2022  . Hepatitis C Screening  Completed   Immunization History  Administered Date(s) Administered  . Influenza Inj Mdck Quad With Preservative 10/24/2018  . Influenza Split 11/13/2011, 11/13/2013  . Influenza Whole 11/25/2008  . Influenza, Seasonal, Injecte, Preservative Fre 12/09/2015  . Influenza-Unspecified 11/13/2011, 11/13/2013, 12/09/2015  . Td 01/23/1990  . Tdap 08/21/2012   Last MGM - 03/21/2019  Past Surgical History:  Procedure Laterality Date  . ANTERIOR LAT LUMBAR FUSION N/A 10/22/2014     Procedure: ANTERIOR LATERAL LUMBAR FUSION 1 LEVEL;  Surgeon: Estill Bamberg, MD;  Location: [facility];  Service: Orthopedics;  Laterality: N/A;  . CHOLECYSTECTOMY  Feb. 2011   Gall Bladder  . ESOPHAGOGASTRODUODENOSCOPY (EGD) WITH PROPOFOL N/A 06/09/2016   Procedure: ESOPHAGOGASTRODUODENOSCOPY (EGD) WITH PROPOFOL;  Surgeon: Napoleon Form, MD;  Location: [facility];  Service: Endoscopy;  Laterality: N/A;   Family History  Problem Relation Age of Onset  . Cancer Mother        lyphoma  . Lung disease Mother   . Arthritis Father   . Hypertension Father   . Barrett's esophagus Father   . Melanoma Sister   . Fibromyalgia Sister   . Mental illness Other   . Stroke   (parent and grandparent) Other   . Heart disease   (grandparent) Other   . Hypertension Other   . Arthritis  - Parent and grandparent Other   . Colon cancer Neg Hx   . Rectal cancer Neg Hx   . Stomach cancer Neg Hx   . Esophageal cancer Neg Hx   . Liver cancer Neg Hx    Social History   Tobacco Use  . Smoking status: Never Smoker  . Smokeless tobacco: Never Used  Vaping Use  . Vaping Use: Never used  Substance Use Topics  . Alcohol use: Yes    Comment: occasionally  . Drug use: No    ROS Constitutional: Denies fever, chills, weight loss/gain, headaches, insomnia,  night sweats, and change in appetite. Does c/o fatigue. Eyes: Denies redness, blurred vision, diplopia, discharge, itchy, watery eyes.  ENT: Denies discharge, congestion, post nasal drip, epistaxis, sore throat, earache, hearing loss, dental pain, Tinnitus, Vertigo, Sinus pain, snoring.  Cardio: Denies chest pain, palpitations, irregular heartbeat, syncope, dyspnea, diaphoresis, orthopnea, PND, claudication, edema Respiratory: denies cough, dyspnea, DOE, pleurisy, hoarseness, laryngitis, wheezing.  Gastrointestinal: Denies dysphagia, heartburn, reflux, water brash, pain, cramps, nausea, vomiting, bloating, diarrhea, constipation, hematemesis, melena,  hematochezia, jaundice, hemorrhoids Genitourinary: Denies dysuria, frequency, urgency, nocturia, hesitancy, discharge, hematuria, flank pain Breast: Breast lumps, nipple discharge, bleeding.  Musculoskeletal: Denies arthralgia, myalgia, stiffness, Jt. Swelling, pain, limp, and strain/sprain. Denies falls. Skin: Denies puritis, rash, hives, warts, acne, eczema, changing in skin lesion Neuro: No weakness, tremor, incoordination, spasms, paresthesia, pain Psychiatric: Denies confusion, memory loss, sensory loss. Denies Depression. Endocrine: Denies change in weight, skin, hair change, nocturia, and paresthesia, diabetic polys, visual blurring, hyper / hypo glycemic episodes.  Heme/Lymph: No excessive bleeding, bruising, enlarged lymph nodes.  Physical Exam  BP 122/86   Pulse 77   Temp 97.7 F (36.5 C)   Ht 5' 5.5" (1.664 m)  Wt 300 lb (136.1 kg)   SpO2 98%   BMI 49.16 kg/m   General Appearance: Well nourished, well groomed and in no apparent distress.  Eyes: PERRLA, EOMs, conjunctiva no swelling or erythema, normal fundi and vessels. Sinuses: No frontal/maxillary tenderness ENT/Mouth: EACs patent / TMs  nl. Nares clear without erythema, swelling, mucoid exudates. Oral hygiene is good. No erythema, swelling, or exudate. Tongue normal, non-obstructing. Tonsils not swollen or erythematous. Hearing normal.  Neck: Supple, thyroid not palpable. No bruits, nodes or JVD. Respiratory: Respiratory effort normal.  BS equal and clear bilateral without rales, rhonci, wheezing or stridor. Cardio: Heart sounds are normal with regular rate and rhythm and no murmurs, rubs or gallops. Peripheral pulses are normal and equal bilaterally without edema. No aortic or femoral bruits. Chest: symmetric with normal excursions and percussion. Breasts: Symmetric, without lumps, nipple discharge, retractions, or fibrocystic changes.  Abdomen: Flat, soft with bowel sounds active. Nontender, no guarding, rebound,  hernias, masses, or organomegaly.  Lymphatics: Non tender without lymphadenopathy.  Genitourinary:  Musculoskeletal: Full ROM all peripheral extremities, joint stability, 5/5 strength, and normal gait. Skin: Warm and dry without rashes, lesions, cyanosis, clubbing or  ecchymosis.  Neuro: Cranial nerves intact, reflexes equal bilaterally. Normal muscle tone, no cerebellar symptoms. Sensation intact.  Pysch: Alert and oriented x 3, normal affect, Insight and Judgment appropriate.   Assessment and Plan  1. Annual Preventative Screening Examination   2. Essential hypertension  - EKG 12-Lead - Urinalysis, Routine w reflex microscopic - Microalbumin / creatinine urine ratio - CBC with Differential/Platelet - COMPLETE METABOLIC PANEL WITH GFR - Magnesium - TSH  3. Hyperlipidemia, mixed  - EKG 12-Lead - Lipid panel - TSH  4. Abnormal glucose  - EKG 12-Lead - Hemoglobin A1c - Insulin, random  5. Vitamin D deficiency  - VITAMIN D 25 Hydroxyl  6. Insulin resistance  - EKG 12-Lead - Hemoglobin A1c - Insulin, random  7. Gastroesophageal reflux disease    8. Migraine    9. Morbid obesity with body mass index (BMI) of 50.0 to 59.9 in adult (HCC)  - TSH  10. OSA (obstructive sleep apnea)   11. Screening for ischemic heart disease  - EKG 12-Lead  12. FHx: heart disease  - EKG 12-Lead  13. Screening for colorectal cancer  - POC Hemoccult Bld/Stl (3-Cd Home Screen); Future  14. Screening-pulmonary TB  - TB Skin Test - TSH  15. Fatigue, unspecified type  - Iron,Total/Total Iron Binding Cap - Vitamin B12 - CBC with Differential/Platelet  16. Medication management  - Urinalysis, Routine w reflex microscopic - Microalbumin / creatinine urine ratio - CBC with Differential/Platelet - COMPLETE METABOLIC PANEL WITH GFR - Magnesium - Lipid panel - TSH - Hemoglobin A1c - Insulin, random - VITAMIN D 25 Hydroxy          Patient was counseled in prudent  diet to achieve/maintain BMI less than 25 for weight control, BP monitoring, regular exercise and medications. Discussed med's effects and SE's. Screening labs and tests as requested with regular follow-up as recommended. Over 40 minutes of exam, counseling, chart review and high complex critical decision making was performed.   Marinus Maw, MD

## 2019-12-24 ENCOUNTER — Other Ambulatory Visit: Payer: Self-pay

## 2019-12-24 ENCOUNTER — Ambulatory Visit: Payer: 59 | Admitting: Internal Medicine

## 2019-12-24 ENCOUNTER — Encounter: Payer: Self-pay | Admitting: Internal Medicine

## 2019-12-24 VITALS — BP 122/86 | HR 77 | Temp 97.7°F | Ht 65.5 in | Wt 300.0 lb

## 2019-12-24 DIAGNOSIS — E88819 Insulin resistance, unspecified: Secondary | ICD-10-CM

## 2019-12-24 DIAGNOSIS — Z8249 Family history of ischemic heart disease and other diseases of the circulatory system: Secondary | ICD-10-CM

## 2019-12-24 DIAGNOSIS — G43009 Migraine without aura, not intractable, without status migrainosus: Secondary | ICD-10-CM

## 2019-12-24 DIAGNOSIS — R5383 Other fatigue: Secondary | ICD-10-CM

## 2019-12-24 DIAGNOSIS — Z111 Encounter for screening for respiratory tuberculosis: Secondary | ICD-10-CM

## 2019-12-24 DIAGNOSIS — I10 Essential (primary) hypertension: Secondary | ICD-10-CM

## 2019-12-24 DIAGNOSIS — Z0000 Encounter for general adult medical examination without abnormal findings: Secondary | ICD-10-CM

## 2019-12-24 DIAGNOSIS — E782 Mixed hyperlipidemia: Secondary | ICD-10-CM

## 2019-12-24 DIAGNOSIS — Z0001 Encounter for general adult medical examination with abnormal findings: Secondary | ICD-10-CM

## 2019-12-24 DIAGNOSIS — E8881 Metabolic syndrome: Secondary | ICD-10-CM

## 2019-12-24 DIAGNOSIS — Z1212 Encounter for screening for malignant neoplasm of rectum: Secondary | ICD-10-CM

## 2019-12-24 DIAGNOSIS — R7309 Other abnormal glucose: Secondary | ICD-10-CM

## 2019-12-24 DIAGNOSIS — Z136 Encounter for screening for cardiovascular disorders: Secondary | ICD-10-CM | POA: Diagnosis not present

## 2019-12-24 DIAGNOSIS — E559 Vitamin D deficiency, unspecified: Secondary | ICD-10-CM

## 2019-12-24 DIAGNOSIS — K2100 Gastro-esophageal reflux disease with esophagitis, without bleeding: Secondary | ICD-10-CM

## 2019-12-24 DIAGNOSIS — G4733 Obstructive sleep apnea (adult) (pediatric): Secondary | ICD-10-CM

## 2019-12-24 DIAGNOSIS — Z1211 Encounter for screening for malignant neoplasm of colon: Secondary | ICD-10-CM

## 2019-12-24 DIAGNOSIS — Z6841 Body Mass Index (BMI) 40.0 and over, adult: Secondary | ICD-10-CM

## 2019-12-24 DIAGNOSIS — Z79899 Other long term (current) drug therapy: Secondary | ICD-10-CM

## 2019-12-24 MED ORDER — PHENTERMINE HCL 37.5 MG PO TABS: ORAL_TABLET | ORAL | 0 refills | Status: DC

## 2019-12-25 LAB — CBC WITH DIFFERENTIAL/PLATELET
Absolute Monocytes: 752 cells/uL (ref 200–950)
Basophils Absolute: 65 cells/uL (ref 0–200)
Basophils Relative: 0.6 %
Eosinophils Absolute: 229 cells/uL (ref 15–500)
Eosinophils Relative: 2.1 %
HCT: 36.5 % (ref 35.0–45.0)
Hemoglobin: 12 g/dL (ref 11.7–15.5)
Lymphs Abs: 2551 cells/uL (ref 850–3900)
MCH: 26.9 pg — ABNORMAL LOW (ref 27.0–33.0)
MCHC: 32.9 g/dL (ref 32.0–36.0)
MCV: 81.8 fL (ref 80.0–100.0)
MPV: 10.3 fL (ref 7.5–12.5)
Monocytes Relative: 6.9 %
Neutro Abs: 7303 cells/uL (ref 1500–7800)
Neutrophils Relative %: 67 %
Platelets: 445 10*3/uL — ABNORMAL HIGH (ref 140–400)
RBC: 4.46 10*6/uL (ref 3.80–5.10)
RDW: 13.7 % (ref 11.0–15.0)
Total Lymphocyte: 23.4 %
WBC: 10.9 10*3/uL — ABNORMAL HIGH (ref 3.8–10.8)

## 2019-12-25 LAB — COMPLETE METABOLIC PANEL WITH GFR
AG Ratio: 1.3 (calc) (ref 1.0–2.5)
ALT: 12 U/L (ref 6–29)
AST: 12 U/L (ref 10–35)
Albumin: 4.1 g/dL (ref 3.6–5.1)
Alkaline phosphatase (APISO): 86 U/L (ref 31–125)
BUN: 9 mg/dL (ref 7–25)
CO2: 28 mmol/L (ref 20–32)
Calcium: 9.4 mg/dL (ref 8.6–10.2)
Chloride: 103 mmol/L (ref 98–110)
Creat: 0.51 mg/dL (ref 0.50–1.10)
GFR, Est African American: 133 mL/min/{1.73_m2} (ref >=60)
GFR, Est Non African American: 115 mL/min/{1.73_m2} (ref >=60)
Globulin: 3.1 g/dL (calc) (ref 1.9–3.7)
Glucose, Bld: 67 mg/dL (ref 65–99)
Potassium: 4 mmol/L (ref 3.5–5.3)
Sodium: 142 mmol/L (ref 135–146)
Total Bilirubin: 0.3 mg/dL (ref 0.2–1.2)
Total Protein: 7.2 g/dL (ref 6.1–8.1)

## 2019-12-25 LAB — MICROALBUMIN / CREATININE URINE RATIO
Creatinine, Urine: 30 mg/dL (ref 20–275)
Microalb Creat Ratio: 40 mcg/mg creat — ABNORMAL HIGH (ref <30)
Microalb, Ur: 1.2 mg/dL

## 2019-12-25 LAB — HEMOGLOBIN A1C
Hgb A1c MFr Bld: 6 % of total Hgb — ABNORMAL HIGH (ref <5.7)
Mean Plasma Glucose: 126 (calc)
eAG (mmol/L): 7 (calc)

## 2019-12-25 LAB — URINALYSIS, ROUTINE W REFLEX MICROSCOPIC
Bilirubin Urine: NEGATIVE
Glucose, UA: NEGATIVE
Hgb urine dipstick: NEGATIVE
Ketones, ur: NEGATIVE
Leukocytes,Ua: NEGATIVE
Nitrite: NEGATIVE
Protein, ur: NEGATIVE
Specific Gravity, Urine: 1.006 (ref 1.001–1.03)
pH: 7.5 (ref 5.0–8.0)

## 2019-12-25 LAB — IRON, TOTAL/TOTAL IRON BINDING CAP
%SAT: 13 % (calc) — ABNORMAL LOW (ref 16–45)
Iron: 38 ug/dL — ABNORMAL LOW (ref 40–190)
TIBC: 304 mcg/dL (calc) (ref 250–450)

## 2019-12-25 LAB — TSH: TSH: 3.73 mIU/L

## 2019-12-25 LAB — LIPID PANEL
Cholesterol: 184 mg/dL (ref <200)
HDL: 48 mg/dL — ABNORMAL LOW (ref >=50)
LDL Cholesterol (Calc): 117 mg/dL (calc) — ABNORMAL HIGH
Non-HDL Cholesterol (Calc): 136 mg/dL (calc) — ABNORMAL HIGH (ref <130)
Total CHOL/HDL Ratio: 3.8 (calc) (ref <5.0)
Triglycerides: 86 mg/dL (ref <150)

## 2019-12-25 LAB — MAGNESIUM: Magnesium: 1.9 mg/dL (ref 1.5–2.5)

## 2019-12-25 LAB — VITAMIN D 25 HYDROXY (VIT D DEFICIENCY, FRACTURES): Vit D, 25-Hydroxy: 33 ng/mL (ref 30–100)

## 2019-12-25 LAB — VITAMIN B12: Vitamin B-12: 356 pg/mL (ref 200–1100)

## 2019-12-25 LAB — INSULIN, RANDOM: Insulin: 16 u[IU]/mL

## 2019-12-25 NOTE — Progress Notes (Signed)
============================================================ - Test results slightly outside the reference range are not unusual. If there is anything important, I will review this with you,  otherwise it is considered normal test values.  If you have further questions,  please do not hesitate to contact me at the office or via My Chart.  ============================================================  -  Iron level is Low - recommend take an OTC iron supplement ============================================================  -   -  Vitamin B12 =  312    Very Low  (Ideal or Goal Vit B12 is between 450 - 1,100)   Low Vit B12 may be associated with Anemia , Fatigue,   Peripheral Neuropathy, Dementia, "Brain Fog", & Depression  - Recommend take a sub-lingual form of Vitamin B12 tablet   1,000 to 5,000 mcg tab that you dissolve under your tongue /Daily   - Can get Lavonia Dana - best price at [employer] or on [employer] ============================================================  -  Total chol = 184 - Great , But ..................  - Bad Dangerous LDLChol = 117 - Elevated  (Ideal orGoal is less than 70 !  )   - Cholesterol is too high - Recommend low cholesterol diet   - Cholesterol only comes from animal sources  - ie. meat, dairy, egg yolks  - Eat all the vegetables you want.  - Avoid meat, especially red meat - Beef AND Pork .  - Avoid cheese & dairy - milk & ice cream.     - Cheese is the most concentrated form of trans-fats which  is the worst thing to clog up our arteries.   - Veggie cheese is OK which can be found in the fresh  produce section at [employer] or [employer] or [employer] ============================================================  -  A1c = 6.0%  = 12 week average blood sugar and  is  elevated in the   borderline and early or pre-diabetes range which has the same   300% increased risk for heart attack, stroke, cancer and   alzheimer- type vascular  dementia as full blown diabetes.   But the good news is that diet, exercise with  weight loss can cure the early diabetes at this point. ============================================================  -  Vitamin D = 33 - Extremely Low - Vitamin D goal is between 70-100.   - Please take Vit D 5,000 unit capsules x 2 capsules = 10,000 units Daily  - It is very important as a natural anti-inflammatory and helping the  immune system protect against viral infections, like the Covid-19    helping hair, skin, and nails, as well as reducing stroke and  heart attack risk.   - It helps your bones and helps with mood.  - It also decreases numerous cancer risks so please  take it as directed.   - Low Vit D is associated with a 200-300% higher risk for  CANCER   and 200-300% higher risk for HEART   ATTACK  &  STROKE.    - It is also associated with higher death rate at younger ages,   autoimmune diseases like Rheumatoid arthritis, Lupus,  Multiple Sclerosis.     - Also many other serious conditions, like depression, Alzheimer's  Dementia, infertility, muscle aches, fatigue, fibromyalgia   - just to name a few. ===========================================================  - All Else - CBC - Kidneys - Electrolytes - Liver - Magnesium & Thyroid    - all  Normal / OK ===========================================================

## 2020-01-18 ENCOUNTER — Other Ambulatory Visit: Payer: Self-pay | Admitting: Internal Medicine

## 2020-01-18 MED ORDER — PANTOPRAZOLE SODIUM 40 MG PO TBEC
DELAYED_RELEASE_TABLET | ORAL | 3 refills | Status: DC
Start: 2020-01-18 — End: 2021-03-29

## 2020-03-11 ENCOUNTER — Other Ambulatory Visit: Payer: Self-pay | Admitting: Neurology

## 2020-03-11 ENCOUNTER — Other Ambulatory Visit: Payer: Self-pay | Admitting: Internal Medicine

## 2020-03-11 MED ORDER — PREGABALIN 150 MG PO CAPS: ORAL_CAPSULE | ORAL | 0 refills | Status: DC

## 2020-03-15 ENCOUNTER — Other Ambulatory Visit: Payer: Self-pay | Admitting: Internal Medicine

## 2020-03-15 MED ORDER — PREGABALIN 150 MG PO CAPS: ORAL_CAPSULE | ORAL | 0 refills | Status: DC

## 2020-04-02 ENCOUNTER — Emergency Department (INDEPENDENT_AMBULATORY_CARE_PROVIDER_SITE_OTHER): Payer: 59

## 2020-04-02 ENCOUNTER — Emergency Department (INDEPENDENT_AMBULATORY_CARE_PROVIDER_SITE_OTHER)
Admission: RE | Admit: 2020-04-02 | Discharge: 2020-04-02 | Disposition: A | Discharge status: Home / Self Care | Payer: 59 | Source: Ambulatory Visit | Attending: Internal Medicine | Admitting: Internal Medicine

## 2020-04-02 ENCOUNTER — Other Ambulatory Visit: Payer: Self-pay

## 2020-04-02 VITALS — BP 126/79 | HR 75 | Temp 97.9°F | Resp 18

## 2020-04-02 DIAGNOSIS — M25561 Pain in right knee: Secondary | ICD-10-CM | POA: Diagnosis not present

## 2020-04-02 DIAGNOSIS — S8391XA Sprain of unspecified site of right knee, initial encounter: Secondary | ICD-10-CM

## 2020-04-02 MED ORDER — IBUPROFEN 600 MG PO TABS
600.0000 mg | ORAL_TABLET | Freq: Three times a day (TID) | ORAL | 0 refills | Status: DC | PRN
Start: 2020-04-02 — End: 2020-04-07

## 2020-04-02 NOTE — ED Triage Notes (Signed)
Pt c/o RT knee pain x 8 days. Says she was moving boxes and twisted her knee trying to avoid a fall down some stairs. Aspirin/advil prn. And Icing past week. Pain 2/10 No previous injuries or surgeries on affected knee.

## 2020-04-02 NOTE — Discharge Instructions (Signed)
Take medications as directed Please call Dr. Rodney Langton office to make an appointment to be seen early next week. Continue wearing the knee brace to help with support.

## 2020-04-02 NOTE — ED Provider Notes (Signed)
[facility]    CSN: [id number] Arrival date & time: 04/02/20  1259      History   Chief Complaint Chief Complaint  Patient presents with  . Knee Pain    RT    HPI Emily Gay is a 50 y.o. female comes to the urgent care with right knee pain 8 days duration.  Patient said the pain started after she twisted her knee.  Patient was carrying some boxes when she lost her balance.  She supported her weight on the right leg.  Following that the patient developed a moderate severity right knee pain which is worse with bearing weight and walking.  She has been taking some analgesics and using right knee brace.  Occasionally she feels that her knee gives way.  No swelling of the knee.  No bruising or erythema.   HPI  Past Medical History:  Diagnosis Date  . Allergic rhinitis   . Anemia   . Arthritis   . Chronic low back pain   . GERD (gastroesophageal reflux disease)   . Headache    migraine  . Hypertension   . Intractable migraine with aura without status migrainosus 10/21/2019  . 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 Obesity   . OSA on CPAP    does not use CPAP    Patient Active Problem List   Diagnosis Date Noted  . Intractable migraine with aura without status migrainosus 10/21/2019  . 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 PLMD (periodic limb movement disorder) 10/06/2019  . Restless legs syndrome (RLS) 10/06/2019  . Radiculopathy 10/22/2014  . OSA (obstructive sleep apnea) 10/17/2013  . 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 Headache 08/13/2013  . GERD 05/16/2013  . Hyperlipidemia 05/16/2013  . Abnormal glucose 05/16/2013  . Encounter for long-term (current) use of medications 05/16/2013  . Insulin resistance syndrome 05/16/2013  . Vitamin D deficiency 02/21/2013  . TMJ PAIN 11/24/2009  . ANEMIA, MILD 08/19/2009  . Class 3 severe obesity due to excess calories without serious comorbidity with body mass index (BMI) of 50.0 to 59.9 in adult (HCC)  10/14/2008  . Essential hypertension 10/14/2008  . Allergic rhinitis 10/13/2008    Past Surgical History:  Procedure Laterality Date  . ANTERIOR LAT LUMBAR FUSION N/A 10/22/2014   Procedure: ANTERIOR LATERAL LUMBAR FUSION 1 LEVEL;  Surgeon: Estill Bamberg, MD;  Location: [facility];  Service: Orthopedics;  Laterality: N/A;  . CHOLECYSTECTOMY  Feb. 2011   Gall Bladder  . ESOPHAGOGASTRODUODENOSCOPY (EGD) WITH PROPOFOL N/A 06/09/2016   Procedure: ESOPHAGOGASTRODUODENOSCOPY (EGD) WITH PROPOFOL;  Surgeon: Napoleon Form, MD;  Location: [facility];  Service: Endoscopy;  Laterality: N/A;    OB History   No obstetric history on file.      Home Medications    Prior to Admission medications   Medication Sig Start Date End Date Taking? Authorizing Provider  ibuprofen (ADVIL) 600 MG tablet Take 1 tablet (600 mg total) by mouth every 8 (eight) hours as needed. 04/02/20  Yes Carmen Vallecillo, Britta Mccreedy, MD  carboxymethylcellulose 1 % ophthalmic solution Apply 1 drop to eye 3 (three) times daily. 04/03/19   LampteyBritta Mccreedy, MD  Cholecalciferol (VITAMIN D PO) Take 5,000 Units by mouth daily.     [provider]  fluticasone (FLONASE) 50 MCG/ACT nasal spray Place 2 sprays into both nostrils at bedtime. 10/24/18   Doree Albee, PA-C  levocetirizine (XYZAL) 5 MG tablet Take 1 tablet (5 mg total) by mouth daily. 10/24/18   Steffanie Dunn,  Dyanne Carrel, PA-C  Multiple Vitamin (MULTIVITAMIN) capsule Take 1 capsule by mouth daily.    [provider]  pantoprazole (PROTONIX) 40 MG tablet Take      1 tablet       Daily        to Prevent  Indigestion & Heartburn 01/18/20   Lucky Cowboy, MD  phentermine (ADIPEX-P) 37.5 MG tablet Take      1/2 to 1 tablet        every Morning      for Dieting & Weight Loss 12/24/19   Lucky Cowboy, MD  pregabalin (LYRICA) 150 MG capsule Take  1 capsule  3 x /day  for Chronic Pain 03/15/20   Lucky Cowboy, MD  sucralfate (CARAFATE) 1 g tablet Take 1 tablet (1 g total) by  mouth 4 (four) times daily -  with meals and at bedtime. 09/18/19 10/18/19  Napoleon Form, MD  SUMAtriptan (IMITREX) 100 MG tablet TAKE 1 TABLET TWO TIMES A DAY AS NEEDED FOR UP TO 1 DOSE FOR MIGRAINE 03/11/20   York Spaniel, MD  verapamil (CALAN-SR) 120 MG CR tablet Take    1    Tablet     Daily       with Food for Migraine Prevention 11/03/19   Lucky Cowboy, MD    Family History Family History  Problem Relation Age of Onset  . Cancer Mother        lyphoma  . Lung disease Mother   . Arthritis Father   . Hypertension Father   . Barrett's esophagus Father   . Melanoma Sister   . Fibromyalgia Sister   . Mental illness Other        emotional illness (other blood relative)  . Stroke Other        Stroke (parent and grandparent)  . Heart disease Other        (grandparent)  . Hypertension Other        (other blood relative)  . Arthritis Other        Parent and grandparent  . Colon cancer Neg Hx   . Rectal cancer Neg Hx   . Stomach cancer Neg Hx   . Esophageal cancer Neg Hx   . Liver cancer Neg Hx     Social History Social History   Tobacco Use  . Smoking status: Never Smoker  . Smokeless tobacco: Never Used  Vaping Use  . Vaping Use: Never used  Substance Use Topics  . Alcohol use: Yes    Comment: occasionally  . Drug use: No     Allergies   Patient has no known allergies.   Review of Systems Review of Systems  Constitutional: Negative.   HENT: Negative.   Musculoskeletal: Positive for arthralgias. Negative for joint swelling.  Skin: Negative.   Neurological: Negative.      Physical Exam Triage Vital Signs ED Triage Vitals  Enc Vitals Group     BP 04/02/20 1311 126/79     Pulse Rate 04/02/20 1311 75     Resp 04/02/20 1311 18     Temp 04/02/20 1311 97.9 F (36.6 C)     Temp Source 04/02/20 1311 Oral     SpO2 04/02/20 1311 98 %     Weight --      Height --      Head Circumference --      Peak Flow --      Pain Score 04/02/20 1309 2  Pain Loc --      Pain Edu? --      Excl. in GC? --    No data found.  Updated Vital Signs BP 126/79 (BP Location: Right Arm)   Pulse 75   Temp 97.9 F (36.6 C) (Oral)   Resp 18   LMP  (LMP Unknown)   SpO2 98%   Visual Acuity Right Eye Distance:   Left Eye Distance:   Bilateral Distance:    Right Eye Near:   Left Eye Near:    Bilateral Near:     Physical Exam Vitals and nursing note reviewed.  Cardiovascular:     Rate and Rhythm: Normal rate and regular rhythm.     Pulses: Normal pulses.     Heart sounds: Normal heart sounds.  Pulmonary:     Effort: Pulmonary effort is normal.     Breath sounds: Normal breath sounds.  Musculoskeletal:        General: Normal range of motion.  Skin:    General: Skin is warm.  Neurological:     Mental Status: She is alert.      UC Treatments / Results  Labs (all labs ordered are listed, but only abnormal results are displayed) Labs Reviewed - No data to display  EKG   Radiology No results found.  Procedures Procedures (including critical care time)  Medications Ordered in UC Medications - No data to display  Initial Impression / Assessment and Plan / UC Course  I have reviewed the triage vital signs and the nursing notes.  Pertinent labs & imaging results that were available during my care of the patient were reviewed by me and considered in my medical decision making (see chart for details).     1.  Right knee sprain, X-ray of the right knee is negative for acute fracture.  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 Continue knee brace use Ibuprofen as needed for pain Follow-up with Dr. Benjamin Stain for further evaluation/management if symptoms does not improve. Final Clinical Impressions(s) / UC Diagnoses   Final diagnoses:  Sprain of right knee, unspecified ligament, initial encounter     Discharge Instructions     Take medications as  directed Please call Dr. Rodney Langton office to make an appointment to be seen early next week. Continue wearing the knee brace to help with support.   ED Prescriptions    Medication Sig Dispense Auth. Provider   ibuprofen (ADVIL) 600 MG tablet Take 1 tablet (600 mg total) by mouth every 8 (eight) hours as needed. 30 tablet Adithi Gammon, Britta Mccreedy, MD     PDMP not reviewed this encounter.   Merrilee Jansky, MD 04/02/20 [phone]

## 2020-04-07 ENCOUNTER — Encounter: Payer: Self-pay | Admitting: Sports Medicine

## 2020-04-07 ENCOUNTER — Other Ambulatory Visit: Payer: Self-pay

## 2020-04-07 ENCOUNTER — Ambulatory Visit: Payer: 59 | Admitting: Sports Medicine

## 2020-04-07 DIAGNOSIS — M2391 Unspecified internal derangement of right knee: Secondary | ICD-10-CM

## 2020-04-07 DIAGNOSIS — M1711 Unilateral primary osteoarthritis, right knee: Secondary | ICD-10-CM | POA: Insufficient documentation

## 2020-04-07 MED ORDER — MELOXICAM 15 MG PO TABS: ORAL_TABLET | ORAL | 3 refills | Status: AC

## 2020-04-07 NOTE — Progress Notes (Signed)
    Procedures performed today:    None.  Independent interpretation of notes and tests performed by another provider:   None.  Brief History, Exam, Impression, and Recommendations:    Knee locking, right This is a pleasant 50 year old female, she took a misstep and had some pain in her knee, she was seen in urgent care, x-rays showed osteoarthritis with potential intra-articular loose body. She is in fact having locking and catching. Pain with terminal flexion. No effusion. Switching from ibuprofen to meloxicam, adding some meniscal rehab exercises but considering locking we do need an MRI. Return to see me in a month, if there is a large free-floating loose body we will likely need to refer her for arthroscopy.    ___________________________________________ Ihor Austin. Benjamin Stain, M.D., ABFM., CAQSM. Primary Care and Sports Medicine [facility]  Adjunct Instructor of Family Medicine  [employer]

## 2020-04-07 NOTE — Assessment & Plan Note (Signed)
This is a pleasant 50 year old female, she took a misstep and had some pain in her knee, she was seen in urgent care, x-rays showed osteoarthritis with potential intra-articular loose body. She is in fact having locking and catching. Pain with terminal flexion. No effusion. Switching from ibuprofen to meloxicam, adding some meniscal rehab exercises but considering locking we do need an MRI. Return to see me in a month, if there is a large free-floating loose body we will likely need to refer her for arthroscopy.

## 2020-04-10 ENCOUNTER — Other Ambulatory Visit: Payer: Self-pay

## 2020-04-10 ENCOUNTER — Ambulatory Visit (INDEPENDENT_AMBULATORY_CARE_PROVIDER_SITE_OTHER): Payer: 59

## 2020-04-10 DIAGNOSIS — M2391 Unspecified internal derangement of right knee: Secondary | ICD-10-CM | POA: Diagnosis not present

## 2020-04-14 ENCOUNTER — Ambulatory Visit: Payer: 59 | Admitting: Adult Health

## 2020-05-05 ENCOUNTER — Ambulatory Visit: Payer: 59 | Admitting: Sports Medicine

## 2020-05-05 ENCOUNTER — Other Ambulatory Visit: Payer: Self-pay

## 2020-05-05 DIAGNOSIS — M1711 Unilateral primary osteoarthritis, right knee: Secondary | ICD-10-CM

## 2020-05-05 NOTE — Assessment & Plan Note (Signed)
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 She did really well with meloxicam and for the most part her knee feels normal now, she had a single episode of locking this past weekend but things have been good since, return to see me on an as-needed basis, if she continues a lot, if she loses trust of the knee feels like she is avoiding activity because of the knee that is an indication to go to the neck step which would be injection.

## 2020-05-05 NOTE — Progress Notes (Signed)
    Procedures performed today:    None.  Independent interpretation of notes and tests performed by another provider:   None.  Brief History, Exam, Impression, and Recommendations:    Primary osteoarthritis of right knee This is a pleasant 50 year old female, we treated her for knee osteo-arthritis, she did have some locking so obtained an MRI that showed OA but no intra-articular loose bodies. She did really well with meloxicam and for the most part her knee feels normal now, she had a single episode of locking this past weekend but things have been good since, return to see me on an as-needed basis, if she continues a lot, if she loses trust of the knee feels like she is avoiding activity because of the knee that is an indication to go to the neck step which would be injection.    ___________________________________________ Ihor Austin. Benjamin Stain, M.D., ABFM., CAQSM. Primary Care and Sports Medicine [facility]  Adjunct Instructor of Family Medicine  [employer]

## 2020-07-16 ENCOUNTER — Other Ambulatory Visit: Payer: Self-pay | Admitting: Neurology

## 2020-07-21 ENCOUNTER — Ambulatory Visit: Payer: 59 | Admitting: Internal Medicine

## 2020-07-27 NOTE — Progress Notes (Signed)
6 MONTH FOLLOW UP Assessment and Plan: Essential hypertension -     CBC with Differential/Platelet -     COMPLETE METABOLIC PANEL WITH GFR -     TSH - continue medications, DASH diet, exercise and monitor at home, normally 130/80. Call if greater than 130/80.   Abnormal glucose -     Hemoglobin A1c Discussed disease progression and risks Discussed diet/exercise, weight management and risk modification  Hyperlipidemia -     Lipid panel       decrease fatty foods increase activity.   Abnormal glucose Discussed disease progression and risks Discussed diet/exercise, weight management and risk modification -check A1C  Morbid obesity (BMI 50) -Pt has lost 6 pounds in the past 6 months - increase veggies, decrease carbs - long discussion about weight loss, diet, and exercise  Vitamin D deficiency -     VITAMIN D 25 Hydroxy (Vit-D Deficiency, Fractures)  MIGRAINE - pt currently using Imitrex but has been having to take 7- 10 a month.  Given samples of Ubrelvy 50 mg which she can use as well as Imitrex , they should be 2 hours apart If Bernita Raisin is beneficial in treatment of her migraines will prescribe and go through my scripts for coverage.  Medication management -     Magnesium   Further disposition pending results of labs. Discussed med's effects and SE's.   Over 30 minutes of exam, counseling, chart review, and critical decision making was performed.    Future Appointments  Date Time Provider Department Center  01/06/2021  2:00 PM Lucky Cowboy, MD [facility] None    HPI  50 y.o. female  presents for a follow up  Her blood pressure has been controlled at home. BP: (!) 142/89   She does not workout due to chronic pain. She denies chest pain, shortness of breath, dizziness.   BMI is Body mass index is 48.18 kg/m. She could not tolerate wellbutrin. Has new job and states her stress is better.     She is morbidly obese, had history of + OSA but unable to tolerate  CPAP, last sleep study 2015.   Migraines are coming more frequently. She is using the imitrex 7-10 x in a month, she states they help tremendously. She did not notice a difference with emgality so stopped it. Has auras and states that she is very good at taking the medications as needed and does not want a preventative at this time.  Used Nurtec in the past with good results but insurance did not want to cover med.  Wt Readings from Last 3 Encounters:  07/28/20 294 lb (133.4 kg)  04/07/20 297 lb (134.7 kg)  12/24/19 300 lb (136.1 kg)   She is not on cholesterol medication and denies myalgias. Her cholesterol is at goal. The cholesterol last visit was:   Lab Results  Component Value Date   CHOL 184 12/24/2019   HDL 48 (L) 12/24/2019   LDLCALC 117 (H) 12/24/2019   LDLDIRECT 134.4 07/26/2011   TRIG 86 12/24/2019   CHOLHDL 3.8 12/24/2019    She has been working on diet and exercise for prediabetes, and denies polydipsia, polyuria and visual disturbances. Last A1C in the office was:  Lab Results  Component Value Date   HGBA1C 6.0 (H) 12/24/2019   Patient is on Vitamin D supplement, 5000 IU daily.   Lab Results  Component Value Date   VD25OH 33 12/24/2019     She had L4-L5 fusion with Dr. Yevette Edwards in Sept 2016.  She has GERD and had EGD 06/06/2016 and MRCP 06/06/2016,  protonix and carafate are controlling symptoms.  Current Medications:     Current Outpatient Medications (Respiratory):    fluticasone (FLONASE) 50 MCG/ACT nasal spray, Place 2 sprays into both nostrils at bedtime.   levocetirizine (XYZAL) 5 MG tablet, Take 1 tablet (5 mg total) by mouth daily.  Current Outpatient Medications (Analgesics):    meloxicam (MOBIC) 15 MG tablet, One tab PO qAM with a meal for 2 weeks, then daily prn pain.   SUMAtriptan (IMITREX) 100 MG tablet, TAKE 1 TABLET TWO TIMES A DAY AS NEEDED FOR UP TO 1 DOSE FOR MIGRAINE   Current Outpatient Medications (Other):    carboxymethylcellulose 1  % ophthalmic solution, Apply 1 drop to eye 3 (three) times daily.   Cholecalciferol (VITAMIN D PO), Take 5,000 Units by mouth daily.    Multiple Vitamin (MULTIVITAMIN) capsule, Take 1 capsule by mouth daily.   pantoprazole (PROTONIX) 40 MG tablet, Take      1 tablet       Daily        to Prevent  Indigestion & Heartburn   pregabalin (LYRICA) 150 MG capsule, Take  1 capsule  3 x /day  for Chronic Pain   sucralfate (CARAFATE) 1 g tablet, Take 1 tablet (1 g total) by mouth 4 (four) times daily -  with meals and at bedtime.  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 2011); Anterior lat lumbar fusion (N/A, 10/22/2014); and Esophagogastroduodenoscopy (egd) with propofol (N/A, 06/09/2016). FAMILY HISTORY Her family history includes Arthritis in her father and another family member; Barrett's esophagus in her father; Cancer in her mother; Fibromyalgia in her sister; Heart disease in an other family member; Hypertension in her father and another family member; Lung disease in her mother; Melanoma in her sister; Mental illness in an other family member; Stroke in an other family member. SOCIAL HISTORY She  reports that she has never smoked. She has never used smokeless tobacco. She reports current alcohol use. She reports that she does not use drugs.   Review of Systems:  Review of Systems  Constitutional: Negative.  Negative for chills, fever, malaise/fatigue and weight loss.  HENT:  Negative for congestion, ear discharge, ear pain, hearing loss, nosebleeds, sore  throat and tinnitus.   Eyes: Negative.  Negative for blurred vision, discharge and redness.  Respiratory: Negative.  Negative for cough, shortness of breath, wheezing and stridor.   Cardiovascular: Negative.  Negative for chest pain, palpitations, orthopnea and leg swelling.  Gastrointestinal:  Positive for heartburn (controlled with carafate and Protonix). Negative for abdominal pain, blood in stool, constipation, diarrhea, nausea and vomiting.  Genitourinary: Negative.  Negative for dysuria, frequency and urgency.  Musculoskeletal:  Positive for joint pain (right knee minor aching). Negative for back pain, falls, myalgias and neck pain.  Skin: Negative.  Negative for rash.  Neurological:  Positive for headaches (migraines 7-10 times a month). Negative for dizziness, tingling, seizures  and weakness.  Endo/Heme/Allergies: Negative.  Does not bruise/bleed easily.  Psychiatric/Behavioral: Negative.  Negative for depression, hallucinations, memory loss and suicidal ideas. The patient does not have insomnia.     Physical Exam: Estimated body mass index is 48.18 kg/m as calculated from the following:   Height as of 04/07/20: 5' 5.5" (1.664 m).   Weight as of this encounter: 294 lb (133.4 kg). BP (!) 142/89   Pulse 83   Temp (!) 96.1 F (35.6 C)   Wt 294 lb (133.4 kg)   SpO2 99%   BMI 48.18 kg/m  General Appearance: Well nourished, in no apparent distress.  Eyes: PERRLA, EOMs, conjunctiva no swelling or erythema, normal fundi and vessels.  Sinuses: No Frontal/maxillary tenderness  ENT/Mouth: Ext aud canals clear, normal light reflex with TMs without erythema, bulging. Good dentition. No erythema, swelling, or exudate on post pharynx. Tonsils not swollen or erythematous. Hearing normal.  Neck: Supple, thyroid normal. No bruits  Respiratory: Respiratory effort normal, BS equal bilaterally without rales, rhonchi, wheezing or stridor.  Cardio: RRR without murmurs, rubs or gallops. Brisk  peripheral pulses with 1+edema.  Chest: symmetric, with normal excursions and percussion.  Abdomen: Soft, nontender, obese no guarding, rebound, hernias, masses, or organomegaly. .  Lymphatics: Non tender without lymphadenopathy.  Musculoskeletal: Full ROM all peripheral extremities,5/5 strength.  Skin: Warm, dry without rashes, lesions, ecchymosis. Neuro: Cranial nerves intact, reflexes equal bilaterally. Normal muscle tone, no cerebellar symptoms.  Psych: Awake and oriented X 3, normal affect, Insight and Judgment appropriate.   Eyad Rochford W Jhamal Plucinski 4:09 PM [facility]

## 2020-07-28 ENCOUNTER — Ambulatory Visit: Payer: 59 | Admitting: Nurse Practitioner

## 2020-07-28 ENCOUNTER — Encounter: Payer: Self-pay | Admitting: Nurse Practitioner

## 2020-07-28 ENCOUNTER — Other Ambulatory Visit: Payer: Self-pay

## 2020-07-28 VITALS — BP 142/89 | HR 83 | Temp 96.1°F | Wt 294.0 lb

## 2020-07-28 DIAGNOSIS — E559 Vitamin D deficiency, unspecified: Secondary | ICD-10-CM | POA: Diagnosis not present

## 2020-07-28 DIAGNOSIS — K2100 Gastro-esophageal reflux disease with esophagitis, without bleeding: Secondary | ICD-10-CM

## 2020-07-28 DIAGNOSIS — Z1329 Encounter for screening for other suspected endocrine disorder: Secondary | ICD-10-CM

## 2020-07-28 DIAGNOSIS — Z6841 Body Mass Index (BMI) 40.0 and over, adult: Secondary | ICD-10-CM

## 2020-07-28 DIAGNOSIS — R7309 Other abnormal glucose: Secondary | ICD-10-CM | POA: Diagnosis not present

## 2020-07-28 DIAGNOSIS — Z79899 Other long term (current) drug therapy: Secondary | ICD-10-CM

## 2020-07-28 DIAGNOSIS — I10 Essential (primary) hypertension: Secondary | ICD-10-CM

## 2020-07-28 DIAGNOSIS — E782 Mixed hyperlipidemia: Secondary | ICD-10-CM | POA: Diagnosis not present

## 2020-07-28 MED ORDER — SUMATRIPTAN SUCCINATE 100 MG PO TABS: ORAL_TABLET | ORAL | 3 refills | Status: DC

## 2020-07-28 NOTE — Patient Instructions (Signed)
Samples of Ubrelvy given.  If working can get covered through a mail order pharmacy- my scripts  Migraine Headache A migraine headache is a very strong throbbing pain on one side or both sides of your head. This type of headache can also cause other symptoms. It can last from 4 hours to 3 days. Talk with your doctor about what things may bring on (trigger) this condition. What are the causes? The exact cause of this condition is not known. This condition may be triggered or caused by: Drinking alcohol. Smoking. Taking medicines, such as: Medicine used to treat chest pain (nitroglycerin). Birth control pills. Estrogen. Some blood pressure medicines. Eating or drinking certain products. Doing physical activity. Other things that may trigger a migraine headache include: Having a menstrual period. Pregnancy. Hunger. Stress. Not getting enough sleep or getting too much sleep. Weather changes. Tiredness (fatigue). What increases the risk? Being 28-55 years old. Being female. Having a family history of migraine headaches. Being Caucasian. Having depression or anxiety. Being very overweight. What are the signs or symptoms? A throbbing pain. This pain may: Happen in any area of the head, such as on one side or both sides. Make it hard to do daily activities. Get worse with physical activity. Get worse around bright lights or loud noises. Other symptoms may include: Feeling sick to your stomach (nauseous). Vomiting. Dizziness. Being sensitive to bright lights, loud noises, or smells. Before you get a migraine headache, you may get warning signs (an aura). An aura may include: Seeing flashing lights or having blind spots. Seeing bright spots, halos, or zigzag lines. Having tunnel vision or blurred vision. Having numbness or a tingling feeling. Having trouble talking. Having weak muscles. Some people have symptoms after a migraine headache (postdromal phase), such  as: Tiredness. Trouble thinking (concentrating). How is this treated? Taking medicines that: Relieve pain. Relieve the feeling of being sick to your stomach. Prevent migraine headaches. Treatment may also include: Having acupuncture. Avoiding foods that bring on migraine headaches. Learning ways to control your body functions (biofeedback). Therapy to help you know and deal with negative thoughts (cognitive behavioral therapy). Follow these instructions at home: Medicines Take over-the-counter and prescription medicines only as told by your doctor. Ask your doctor if the medicine prescribed to you: Requires you to avoid driving or using heavy machinery. Can cause trouble pooping (constipation). You may need to take these steps to prevent or treat trouble pooping: Drink enough fluid to keep your pee (urine) pale yellow. Take over-the-counter or prescription medicines. Eat foods that are high in fiber. These include beans, whole grains, and fresh fruits and vegetables. Limit foods that are high in fat and sugar. These include fried or sweet foods. Lifestyle Do not drink alcohol. Do not use any products that contain nicotine or tobacco, such as cigarettes, e-cigarettes, and chewing tobacco. If you need help quitting, ask your doctor. Get at least 8 hours of sleep every night. Limit and deal with stress. General instructions     Keep a journal to find out what may bring on your migraine headaches. For example, write down: What you eat and drink. How much sleep you get. Any change in what you eat or drink. Any change in your medicines. If you have a migraine headache: Avoid things that make your symptoms worse, such as bright lights. It may help to lie down in a dark, quiet room. Do not drive or use heavy machinery. Ask your doctor what activities are safe for you. Keep all  follow-up visits as told by your doctor. This is important. Contact a doctor if: You get a migraine  headache that is different or worse than others you have had. You have more than 15 headache days in one month. Get help right away if: Your migraine headache gets very bad. Your migraine headache lasts longer than 72 hours. You have a fever. You have a stiff neck. You have trouble seeing. Your muscles feel weak or like you cannot control them. You start to lose your balance a lot. You start to have trouble walking. You pass out (faint). You have a seizure. Summary A migraine headache is a very strong throbbing pain on one side or both sides of your head. These headaches can also cause other symptoms. This condition may be treated with medicines and changes to your lifestyle. Keep a journal to find out what may bring on your migraine headaches. Contact a doctor if you get a migraine headache that is different or worse than others you have had. Contact your doctor if you have more than 15 headache days in a month. This information is not intended to replace advice given to you by your health care provider. Make sure you discuss any questions you have with your healthcare provider. Document Revised: 05/03/2018 Document Reviewed: 02/21/2018 Elsevier Patient Education  [place].

## 2020-07-29 ENCOUNTER — Other Ambulatory Visit: Payer: Self-pay | Admitting: Nurse Practitioner

## 2020-07-29 DIAGNOSIS — E039 Hypothyroidism, unspecified: Secondary | ICD-10-CM

## 2020-07-29 LAB — COMPLETE METABOLIC PANEL WITH GFR
AG Ratio: 1.4 (calc) (ref 1.0–2.5)
ALT: 12 U/L (ref 6–29)
AST: 14 U/L (ref 10–35)
Albumin: 4.1 g/dL (ref 3.6–5.1)
Alkaline phosphatase (APISO): 85 U/L (ref 31–125)
BUN: 10 mg/dL (ref 7–25)
CO2: 27 mmol/L (ref 20–32)
Calcium: 8.9 mg/dL (ref 8.6–10.2)
Chloride: 103 mmol/L (ref 98–110)
Creat: 0.6 mg/dL (ref 0.50–1.10)
GFR, Est African American: 125 mL/min/{1.73_m2} (ref >=60)
GFR, Est Non African American: 108 mL/min/{1.73_m2} (ref >=60)
Globulin: 3 g/dL (calc) (ref 1.9–3.7)
Glucose, Bld: 87 mg/dL (ref 65–99)
Potassium: 4.3 mmol/L (ref 3.5–5.3)
Sodium: 140 mmol/L (ref 135–146)
Total Bilirubin: 0.3 mg/dL (ref 0.2–1.2)
Total Protein: 7.1 g/dL (ref 6.1–8.1)

## 2020-07-29 LAB — CBC WITH DIFFERENTIAL/PLATELET
Absolute Monocytes: 654 cells/uL (ref 200–950)
Basophils Absolute: 65 cells/uL (ref 0–200)
Basophils Relative: 0.6 %
Eosinophils Absolute: 294 cells/uL (ref 15–500)
Eosinophils Relative: 2.7 %
HCT: 37 % (ref 35.0–45.0)
Hemoglobin: 12.1 g/dL (ref 11.7–15.5)
Lymphs Abs: 2867 cells/uL (ref 850–3900)
MCH: 26.7 pg — ABNORMAL LOW (ref 27.0–33.0)
MCHC: 32.7 g/dL (ref 32.0–36.0)
MCV: 81.5 fL (ref 80.0–100.0)
MPV: 9.9 fL (ref 7.5–12.5)
Monocytes Relative: 6 %
Neutro Abs: 7020 cells/uL (ref 1500–7800)
Neutrophils Relative %: 64.4 %
Platelets: 459 10*3/uL — ABNORMAL HIGH (ref 140–400)
RBC: 4.54 10*6/uL (ref 3.80–5.10)
RDW: 13.8 % (ref 11.0–15.0)
Total Lymphocyte: 26.3 %
WBC: 10.9 10*3/uL — ABNORMAL HIGH (ref 3.8–10.8)

## 2020-07-29 LAB — TSH: TSH: 4.68 mIU/L — ABNORMAL HIGH

## 2020-07-29 LAB — LIPID PANEL
Cholesterol: 172 mg/dL (ref <200)
HDL: 44 mg/dL — ABNORMAL LOW (ref >=50)
LDL Cholesterol (Calc): 104 mg/dL (calc) — ABNORMAL HIGH
Non-HDL Cholesterol (Calc): 128 mg/dL (calc) (ref <130)
Total CHOL/HDL Ratio: 3.9 (calc) (ref <5.0)
Triglycerides: 147 mg/dL (ref <150)

## 2020-07-29 LAB — HEMOGLOBIN A1C
Hgb A1c MFr Bld: 6 % of total Hgb — ABNORMAL HIGH (ref <5.7)
Mean Plasma Glucose: 126 mg/dL
eAG (mmol/L): 7 mmol/L

## 2020-07-29 LAB — VITAMIN D 25 HYDROXY (VIT D DEFICIENCY, FRACTURES): Vit D, 25-Hydroxy: 36 ng/mL (ref 30–100)

## 2020-07-29 LAB — MAGNESIUM: Magnesium: 2 mg/dL (ref 1.5–2.5)

## 2020-07-29 MED ORDER — LEVOTHYROXINE SODIUM 50 MCG PO TABS
25.0000 ug | ORAL_TABLET | Freq: Every day | ORAL | 1 refills | Status: DC
Start: 2020-07-29 — End: 2020-11-02

## 2020-07-29 MED ORDER — LEVOTHYROXINE SODIUM 25 MCG PO TABS: 25.0000 ug | ORAL_TABLET | Freq: Every day | ORAL | Status: DC

## 2020-10-06 ENCOUNTER — Other Ambulatory Visit: Payer: Self-pay | Admitting: Internal Medicine

## 2020-10-06 ENCOUNTER — Other Ambulatory Visit: Payer: Self-pay | Admitting: Gastroenterology

## 2020-11-02 ENCOUNTER — Other Ambulatory Visit: Payer: Self-pay | Admitting: Nurse Practitioner

## 2020-11-02 DIAGNOSIS — E039 Hypothyroidism, unspecified: Secondary | ICD-10-CM

## 2020-11-09 ENCOUNTER — Other Ambulatory Visit: Payer: Self-pay | Admitting: Nurse Practitioner

## 2020-11-15 ENCOUNTER — Encounter: Payer: 59 | Admitting: Adult Health

## 2021-01-06 ENCOUNTER — Encounter: Payer: Self-pay | Admitting: Internal Medicine

## 2021-01-06 ENCOUNTER — Other Ambulatory Visit: Payer: Self-pay

## 2021-01-06 ENCOUNTER — Ambulatory Visit (INDEPENDENT_AMBULATORY_CARE_PROVIDER_SITE_OTHER): Payer: 59 | Admitting: Internal Medicine

## 2021-01-06 VITALS — BP 118/78 | HR 103 | Temp 97.9°F | Resp 16 | Ht 65.5 in | Wt 300.8 lb

## 2021-01-06 DIAGNOSIS — G4733 Obstructive sleep apnea (adult) (pediatric): Secondary | ICD-10-CM

## 2021-01-06 DIAGNOSIS — E8881 Metabolic syndrome: Secondary | ICD-10-CM

## 2021-01-06 DIAGNOSIS — Z111 Encounter for screening for respiratory tuberculosis: Secondary | ICD-10-CM

## 2021-01-06 DIAGNOSIS — Z136 Encounter for screening for cardiovascular disorders: Secondary | ICD-10-CM

## 2021-01-06 DIAGNOSIS — Z6841 Body Mass Index (BMI) 40.0 and over, adult: Secondary | ICD-10-CM

## 2021-01-06 DIAGNOSIS — Z0001 Encounter for general adult medical examination with abnormal findings: Secondary | ICD-10-CM

## 2021-01-06 DIAGNOSIS — E782 Mixed hyperlipidemia: Secondary | ICD-10-CM

## 2021-01-06 DIAGNOSIS — I10 Essential (primary) hypertension: Secondary | ICD-10-CM | POA: Diagnosis not present

## 2021-01-06 DIAGNOSIS — E559 Vitamin D deficiency, unspecified: Secondary | ICD-10-CM

## 2021-01-06 DIAGNOSIS — Z23 Encounter for immunization: Secondary | ICD-10-CM

## 2021-01-06 DIAGNOSIS — Z79899 Other long term (current) drug therapy: Secondary | ICD-10-CM

## 2021-01-06 DIAGNOSIS — Z1211 Encounter for screening for malignant neoplasm of colon: Secondary | ICD-10-CM

## 2021-01-06 DIAGNOSIS — Z0000 Encounter for general adult medical examination without abnormal findings: Secondary | ICD-10-CM

## 2021-01-06 DIAGNOSIS — Z8249 Family history of ischemic heart disease and other diseases of the circulatory system: Secondary | ICD-10-CM | POA: Diagnosis not present

## 2021-01-06 DIAGNOSIS — R5383 Other fatigue: Secondary | ICD-10-CM

## 2021-01-06 DIAGNOSIS — G43009 Migraine without aura, not intractable, without status migrainosus: Secondary | ICD-10-CM

## 2021-01-06 DIAGNOSIS — R7309 Other abnormal glucose: Secondary | ICD-10-CM

## 2021-01-06 MED ORDER — VERAPAMIL HCL ER 180 MG PO TBCR
EXTENDED_RELEASE_TABLET | ORAL | 3 refills | Status: AC
Start: 2021-01-06 — End: ?

## 2021-01-06 NOTE — Progress Notes (Signed)
Annual Screening/Preventative Visit & Comprehensive Evaluation &  Examination  Future Appointments  Date Time Provider  01/06/2021             CPE  2:00 PM Unk Pinto, MD  01/18/2022             CPE   2:00 PM Unk Pinto, MD        This very nice 50 y.o. MWF  presents for a Screening /Preventative Visit & comprehensive evaluation and management of multiple medical co-morbidities.  Patient has been followed for HTN, HLD, Prediabetes  and Vitamin D Deficiency. Patient still have difficulties with Migraines 2-3 x /week which she describes as disabling.         Labile HTN predates circa 2014 Patient's BP has been controlled at home and patient denies any cardiac symptoms as chest pain, palpitations, shortness of breath, dizziness or ankle swelling. Today's BP is at goal - 118/78.        Patient's hyperlipidemia is controlled with diet and medications. Patient denies myalgias or other medication SE's. Last lipids were   Lab Results  Component Value Date   CHOL 172 07/28/2020   HDL 44 (L) 07/28/2020   LDLCALC 104 (H) 07/28/2020   LDLDIRECT 134.4 07/26/2011   TRIG 147 07/28/2020   CHOLHDL 3.9 07/28/2020         Patient has  Morbid Obesity  (BMI 51+)  w/hx/o prediabetes w/Insulin Resistance (A1c of 5.6% /insulin 124 /2015 ) and patient denies reactive hypoglycemic symptoms, visual blurring, diabetic polys or paresthesias. Last A1c was   Lab Results  Component Value Date   HGBA1C 6.0 (H) 07/28/2020         Finally, patient has history of Vitamin D Deficiency ("34" /2014) and last Vitamin D was still very low  (goal 70-100) :  Lab Results  Component Value Date   VD25OH 36 07/28/2020     Current Outpatient Medications on File Prior to Visit  Medication Sig   carboxymethylcellulose 1 % ophth  soln Apply 1 drop to eye 3 times daily.   VITAMIN D   5,000 Units  Take  daily.    FLONASE  nasal spray Place 2 sprays into both nostrils at bedtime.   levocetirizine 5 MG  tablet Take 1 tablet  daily.   levothyroxine  50 MCG tablet TAKE 1/2 TABLET DAILY   meloxicam 15 MG tablet One tab PO qAM with a meal prn pain.   Multiple Vitamin (MULTIVITAMIN) capsule Take 1 capsule daily.   pantoprazole (PROTONIX) 40 MG tablet Take 1 tablet  Daily       pregabalin (LYRICA) 150 MG capsule TAKE 1 CAPSULE THREE TIMES A DAY    sucralfate (CARAFATE) 1 g tablet Take 1 tablet  4 times daily -  with meals and at bedtime.   SUMAtriptan (IMITREX) 100 MG tablet TAKE ONE TABLET  TWICE A DAY      No Known Allergies   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   Obesity    OSA - does not use CPAP         Health Maintenance  Topic Date Due   COVID-19 Vaccine (1) Never done  Pneumococcal Vaccine 58-31 Years old (1 - PCV) Never done   HIV Screening  Never done   COLONOSCOPY (Pts 45-90yrs Insurance coverage will need to be confirmed)  Never done   INFLUENZA VACCINE  08/23/2020   PAP SMEAR-Modifier  10/23/2020   TETANUS/TDAP  08/22/2022   Hepatitis C Screening  Completed   HPV VACCINES  Aged Out     Immunization History  Administered Date(s) Administered   Influenza Inj Mdck Quad  10/24/2018   Influenza Split 11/13/2011, 11/13/2013   Influenza Whole 11/25/2008   Influenza, Seasonal 12/09/2015   Influenza 11/13/2011, 11/13/2013, 12/09/2015   PPD Test 12/24/2019   Td 01/23/1990   Tdap 08/21/2012    Last Colon - Never   Last MGM -  03/21/2019  and had  Lt. Breast Bx 03/26/2019 -    Past Surgical History:  Procedure Laterality Date   ANTERIOR LAT LUMBAR FUSION N/A 10/22/2014   Procedure: ANTERIOR LATERAL LUMBAR FUSION 1 LEVEL;  Surgeon: Estill Bamberg, MD;  Location: [facility] OR;  Service: Orthopedics;  Laterality:  N/A;   CHOLECYSTECTOMY  Feb. 2011   Gall Bladder   ESOPHAGOGASTRODUODENOSCOPY (EGD) WITH PROPOFOL N/A 06/09/2016   Procedure: ESOPHAGOGASTRODUODENOSCOPY (EGD) WITH PROPOFOL;  Surgeon: Napoleon Form, MD;  Location: [facility];  Service: Endoscopy;  Laterality: N/A;     Family History  Problem Relation Age of Onset   Cancer Mother        lyphoma   Lung disease Mother    Arthritis Father    Hypertension Father    Barrett's esophagus Father    Melanoma Sister    Fibromyalgia Sister    Mental illness Other        emotional illness (other blood relative)   Stroke Other        Stroke (parent and grandparent)   Heart disease Other        (grandparent)   Hypertension Other        (other blood relative)   Arthritis Other        Parent and grandparent   Colon cancer Neg Hx    Rectal cancer Neg Hx    Stomach cancer Neg Hx    Esophageal cancer Neg Hx    Liver cancer Neg Hx      Social History   Tobacco Use   Smoking status: Never   Smokeless tobacco: Never  Vaping Use   Vaping Use: Never used  Substance Use Topics   Alcohol use: Yes    Comment: occasionally   Drug use: No      ROS Constitutional: Denies fever, chills, weight loss/gain, headaches, insomnia,  night sweats, and change in appetite. Does c/o fatigue. Eyes: Denies redness, blurred vision, diplopia, discharge, itchy, watery eyes.  ENT: Denies discharge, congestion, post nasal drip, epistaxis, sore throat, earache, hearing loss, dental pain, Tinnitus, Vertigo, Sinus pain, snoring.  Cardio: Denies chest pain, palpitations, irregular heartbeat, syncope, dyspnea, diaphoresis, orthopnea, PND, claudication, edema Respiratory: denies cough, dyspnea, DOE, pleurisy, hoarseness, laryngitis, wheezing.  Gastrointestinal: Denies dysphagia, heartburn, reflux, water brash, pain, cramps, nausea, vomiting, bloating, diarrhea, constipation, hematemesis, melena, hematochezia, jaundice, hemorrhoids Genitourinary: Denies  dysuria, frequency, urgency, nocturia, hesitancy, discharge, hematuria, flank pain Breast: Breast lumps, nipple discharge, bleeding.  Musculoskeletal: Denies arthralgia, myalgia, stiffness, Jt. Swelling, pain, limp, and strain/sprain. Denies falls. Skin: Denies puritis, rash, hives, warts, acne, eczema, changing in skin lesion Neuro: No weakness, tremor, incoordination, spasms, paresthesia, pain Psychiatric: Denies confusion, memory loss, sensory loss. Denies Depression. Endocrine: Denies change in weight,  skin, hair change, nocturia, and paresthesia, diabetic polys, visual blurring, hyper / hypo glycemic episodes.  Heme/Lymph: No excessive bleeding, bruising, enlarged lymph nodes.  Physical Exam  BP 118/78    Pulse (!) 103    Temp 97.9 F (36.6 C)    Resp 16    Ht 5' 5.5" (1.664 m)    Wt (!) 300 lb 12.8 oz (136.4 kg)    SpO2 99%    BMI 49.29 kg/m   General Appearance: Over nourished, well groomed and in no apparent distress.  Eyes: PERRLA, EOMs, conjunctiva no swelling or erythema, normal fundi and vessels. Sinuses: No frontal/maxillary tenderness ENT/Mouth: EACs patent / TMs  nl. Nares clear without erythema, swelling, mucoid exudates. Oral hygiene is good. No erythema, swelling, or exudate. Tongue normal, non-obstructing. Tonsils not swollen or erythematous. Hearing normal.  Neck: Supple, thyroid not palpable. No bruits, nodes or JVD. Respiratory: Respiratory effort normal.  BS equal and clear bilateral without rales, rhonci, wheezing or stridor. Cardio: Heart sounds are normal with regular rate and rhythm and no murmurs, rubs or gallops. Peripheral pulses are normal and equal bilaterally without edema. No aortic or femoral bruits. Chest: symmetric with normal excursions and percussion. Breasts: Symmetric, without lumps, nipple discharge, retractions, or fibrocystic changes.  Abdomen:  Rotund, soft with bowel sounds active. Nontender, no guarding, rebound, hernias, masses, or  organomegaly.  Lymphatics: Non tender without lymphadenopathy.  Musculoskeletal: Full ROM all peripheral extremities, joint stability, 5/5 strength, and normal gait. Skin: Warm and dry without rashes, lesions, cyanosis, clubbing or  ecchymosis.  Neuro: Cranial nerves intact, reflexes equal bilaterally. Normal muscle tone, no cerebellar symptoms. Sensation intact.  Pysch: Alert and oriented X 3, normal affect, Insight and Judgment appropriate.    Assessment and Plan  1. Annual Preventative Screening Examination   2. Essential hypertension  - EKG 12-Lead - Urinalysis, Routine w reflex microscopic - Microalbumin / creatinine urine ratio - CBC with Differential/Platelet - COMPLETE METABOLIC PANEL WITH GFR - Magnesium - TSH  3. Hyperlipidemia, mixed  - EKG 12-Lead - TSH  4. Abnormal glucose  - EKG 12-Lead - Hemoglobin A1c - Insulin, random  5. Insulin resistance syndrome  - EKG 12-Lead - Hemoglobin A1c - Insulin, random  6. Vitamin D deficiency  - VITAMIN D 25 Hydroxy   7. Morbid obesity with body mass index (BMI) of 50.0  (49.2)  (HCC)  - TSH  8. Migraine without aura   - Sx's Innopran XL 80 mg    sig : take 1 tab qam for Migraine Prophylaxis Disp# 84   Rx Verapamil 180 mg Sig : take 1 tablet every night for Migraine Prophylaxis Disp # 90  x 3 Rf  9. OSA (obstructive sleep apnea)   10. Screening for ischemic heart disease  - EKG 12-Lead  11. FHx: heart disease  - EKG 12-Lead  12. Screening for colorectal cancer  - POC Hemoccult Bld/Stl   13. Screening-pulmonary TB  - TB Skin Test  14. Fatigue, unspecified type  - Iron, Total/Total Iron Binding Cap - Vitamin B12 - CBC with Differential/Platelet - TSH  15. Medication management  - Urinalysis, Routine w reflex microscopic - Microalbumin / creatinine urine ratio - CBC with Differential/Platelet - COMPLETE METABOLIC PANEL WITH GFR - Magnesium - Lipid panel - TSH - Hemoglobin A1c -  Insulin, random - VITAMIN D 25 Hydroxy           Patient was counseled in prudent diet to achieve/maintain BMI less than 25 for weight  control, BP monitoring, regular exercise and medications. Discussed med's effects and SE's. Screening labs and tests as requested with regular follow-up as recommended. Over 40 minutes of exam, counseling, chart review and high complex critical decision making was performed.   Kirtland Bouchard, MD

## 2021-01-06 NOTE — Patient Instructions (Signed)
Due to recent changes in healthcare laws, you may see the results of your imaging and laboratory studies on MyChart before your provider has had a chance to review them.  We understand that in some cases there may be results that are confusing or concerning to you. Not all laboratory results come back in the same time frame and the provider may be waiting for multiple results in order to interpret others.  Please give [place] 48 hours in order for your provider to thoroughly review all the results before contacting the office for clarification of your results.   ++++++++++++++++++++++++++++++  Vit D  & Vit C 1,000 mg   are recommended to help protect  against the Covid-19 and other Corona viruses.    Also it's recommended  to take  Zinc 50 mg  to help  protect against the Covid-19   and best place to get  is also on [email or web address]  and don't pay more than 6-8 cents /pill !  ================================ Coronavirus (COVID-19) Are you at risk?  Are you at risk for the Coronavirus (COVID-19)?  To be considered HIGH RISK for Coronavirus (COVID-19), you have to meet the following criteria:  Traveled to [place], [place], [place], [place] or [place]; or in the [place] to [place], [place], [place]  or [place]; and have fever, cough, and shortness of breath within the last 2 weeks of travel OR Been in close contact with a person diagnosed with COVID-19 within the last 2 weeks and have  fever, cough,and shortness of breath  IF YOU DO NOT MEET THESE CRITERIA, YOU ARE CONSIDERED LOW RISK FOR COVID-19.  What to do if you are HIGH RISK for COVID-19?  If you are having a medical emergency, call 911. Seek medical care right away. Before you go to a doctors office, urgent care or emergency department,  call ahead and tell them about your recent travel, contact with someone diagnosed with COVID-19   and your symptoms.  You should receive instructions from your physicians office regarding  next steps of care.  When you arrive at healthcare provider, tell the healthcare staff immediately you have returned from  visiting [place], [place], [place], [place] or [place]; or traveled in the [place] to [place], [place],  [place] or [place] in the last two weeks or you have been in close contact with a person diagnosed with  COVID-19 in the last 2 weeks.   Tell the health care staff about your symptoms: fever, cough and shortness of breath. After you have been seen by a medical provider, you will be either: Tested for (COVID-19) and discharged home on quarantine except to seek medical care if  symptoms worsen, and asked to  Stay home and avoid contact with others until you get your results (4-5 days)  Avoid travel on public transportation if possible (such as bus, train, or airplane) or Sent to the Emergency Department by EMS for evaluation, COVID-19 testing  and  possible admission depending on your condition and test results.  What to do if you are LOW RISK for COVID-19?  Reduce your risk of any infection by using the same precautions used for avoiding the common cold or flu:  Wash your hands often with soap and warm water for at least 20 seconds.  If soap and water are not readily available,  use an alcohol-based hand sanitizer with at least 60% alcohol.  If coughing or sneezing, cover your mouth and nose by  or sneezing into the elbow areas of your shirt or coat,  into a tissue or into your sleeve (not your hands). Avoid shaking hands with others and consider head nods or verbal greetings only. Avoid touching your eyes, nose, or mouth with unwashed hands.  Avoid close contact with people who are sick. Avoid places or events with large numbers of people in one location, like concerts or sporting events. Carefully consider travel plans you have or are making. If you are planning any travel outside or inside the [place], visit the CDC's Travelers' Health webpage for  the latest health notices. If you have some symptoms but not all symptoms, continue to monitor at home and seek medical attention  if your symptoms worsen. If you are having a medical emergency, call 911. >>>>>>>>>>>>>>>>>>>>>>> Preventive Care for Adults  A healthy lifestyle and preventive care can promote health and wellness. Preventive health guidelines for women include the following key practices. A routine yearly physical is a good way to check with your health care provider about your health and preventive screening. It is a chance to share any concerns and updates on your health and to receive a thorough exam. Visit your dentist for a routine exam and preventive care every 6 months. Brush your teeth twice a day and floss once a day. Good oral hygiene prevents tooth decay and gum disease. The frequency of eye exams is based on your age, health, family medical history, use of contact lenses, and other factors. Follow your health care provider's recommendations for frequency of eye exams. Eat a healthy diet. Foods like vegetables, fruits, whole grains, low-fat dairy products, and lean protein foods contain the nutrients you need without too many calories. Decrease your intake of foods high in solid fats, added sugars, and salt. Eat the right amount of calories for you. Get information about a proper diet from your health care provider, if necessary. Regular physical exercise is one of the most important things you can do for your health. Most adults should get at least 150 minutes of moderate-intensity exercise (any activity that increases your heart rate and causes you to sweat) each week. In addition, most adults need muscle-strengthening exercises on 2 or more days a week. Maintain a healthy weight. The body mass index (BMI) is a screening tool to identify possible weight problems. It provides an estimate of body fat based on height and weight. Your health care provider can find your BMI and can  help you achieve or maintain a healthy weight. For adults 20 years and older: A BMI below 18.5 is considered underweight. A BMI of 18.5 to 24.9 is normal. A BMI of 25 to 29.9 is considered overweight. A BMI of 30 and above is considered obese. Maintain normal blood lipids and cholesterol levels by exercising and minimizing your intake of saturated fat. Eat a balanced diet with plenty of fruit and vegetables. Blood tests for lipids and cholesterol should begin at age 20 and be repeated every 5 years. If your lipid or cholesterol levels are high, you are over 50, or you are at high risk for heart disease, you may need your cholesterol levels checked more frequently. Ongoing high lipid and cholesterol levels should be treated with medicines if diet and exercise are not working. If you smoke, find out from your health care provider how to quit. If you do not use tobacco, do not start. Lung cancer screening is recommended for adults aged 55-80 years who are at high risk for developing   developing lung cancer because of a history of smoking. A yearly low-dose CT scan of the lungs is recommended for people who have at least a 30-pack-year history of smoking and are a current smoker or have quit within the past 15 years. A pack year of smoking is smoking an average of 1 pack of cigarettes a day for 1 year (for example: 1 pack a day for 30 years or 2 packs a day for 15 years). Yearly screening should continue until the smoker has stopped smoking for at least 15 years. Yearly screening should be stopped for people who develop a health problem that would prevent them from having lung cancer treatment. High blood pressure causes heart disease and increases the risk of stroke. Your blood pressure should be checked at least every 1 to 2 years. Ongoing high blood pressure should be treated with medicines if weight loss and exercise do not work. If you are 67-36 years old, ask your health care provider if you should take aspirin to  prevent strokes. Diabetes screening involves taking a blood sample to check your fasting blood sugar level. This should be done once every 3 years, after age 84, if you are within normal weight and without risk factors for diabetes. Testing should be considered at a younger age or be carried out more frequently if you are overweight and have at least 1 risk factor for diabetes. Breast cancer screening is essential preventive care for women. You should practice "breast self-awareness." This means understanding the normal appearance and feel of your breasts and may include breast self-examination. Any changes detected, no matter how small, should be reported to a health care provider. Women in their 67s and 30s should have a clinical breast exam (CBE) by a health care provider as part of a regular health exam every 1 to 3 years. After age 53, women should have a CBE every year. Starting at age 68, women should consider having a mammogram (breast X-ray test) every year. Women who have a family history of breast cancer should talk to their health care provider about genetic screening. Women at a high risk of breast cancer should talk to their health care providers about having an MRI and a mammogram every year. Breast cancer gene (BRCA)-related cancer risk assessment is recommended for women who have family members with BRCA-related cancers. BRCA-related cancers include breast, ovarian, tubal, and peritoneal cancers. Having family members with these cancers may be associated with an increased risk for harmful changes (mutations) in the breast cancer genes BRCA1 and BRCA2. Results of the assessment will determine the need for genetic counseling and BRCA1 and BRCA2 testing. Routine pelvic exams to screen for cancer are no longer recommended for nonpregnant women who are considered low risk for cancer of the pelvic organs (ovaries, uterus, and vagina) and who do not have symptoms. Ask your health care provider if a  screening pelvic exam is right for you. If you have had past treatment for cervical cancer or a condition that could lead to cancer, you need Pap tests and screening for cancer for at least 20 years after your treatment. If Pap tests have been discontinued, your risk factors (such as having a new sexual partner) need to be reassessed to determine if screening should be resumed. Some women have medical problems that increase the chance of getting cervical cancer. In these cases, your health care provider may recommend more frequent screening and Pap tests. Colorectal cancer can be detected and often prevented. Most routine  colorectal cancer screening begins at the age of 48 years and continues through age 29 years. However, your health care provider may recommend screening at an earlier age if you have risk factors for colon cancer. On a yearly basis, your health care provider may provide home test kits to check for hidden blood in the stool. Use of a small camera at the end of a tube, to directly examine the colon (sigmoidoscopy or colonoscopy), can detect the earliest forms of colorectal cancer. Talk to your health care provider about this at age 12, when routine screening begins.  Direct exam of the colon should be repeated every 5-10 years through age 26 years, unless early forms of pre-cancerous polyps or small growths are found. Hepatitis C blood testing is recommended for all people born from [birth year] through 1965 and any individual with known risks for hepatitis C.  Osteoporosis is a disease in which the bones lose minerals and strength with aging. This can result in serious bone fractures or breaks. The risk of osteoporosis can be identified using a bone density scan. Women ages 30 years and over and women at risk for fractures or osteoporosis should discuss screening with their health care providers. Ask your health care provider whether you should take a calcium supplement or vitamin D to reduce the rate  of osteoporosis. Menopause can be associated with physical symptoms and risks. Hormone replacement therapy is available to decrease symptoms and risks. You should talk to your health care provider about whether hormone replacement therapy is right for you. Use sunscreen. Apply sunscreen liberally and repeatedly throughout the day. You should seek shade when your shadow is shorter than you. Protect yourself by wearing long sleeves, pants, a wide-brimmed hat, and sunglasses year round, whenever you are outdoors. Once a month, do a whole body skin exam, using a mirror to look at the skin on your back. Tell your health care provider of new moles, moles that have irregular borders, moles that are larger than a pencil eraser, or moles that have changed in shape or color. Stay current with required vaccines (immunizations). Influenza vaccine. All adults should be immunized every year. Tetanus, diphtheria, and acellular pertussis (Td, Tdap) vaccine. Pregnant women should receive 1 dose of Tdap vaccine during each pregnancy. The dose should be obtained regardless of the length of time since the last dose. Immunization is preferred during the 27th-36th week of gestation. An adult who has not previously received Tdap or who does not know her vaccine status should receive 1 dose of Tdap. This initial dose should be followed by tetanus and diphtheria toxoids (Td) booster doses every 10 years. Adults with an unknown or incomplete history of completing a 3-dose immunization series with Td-containing vaccines should begin or complete a primary immunization series including a Tdap dose. Adults should receive a Td booster every 10 years. Varicella vaccine. An adult without evidence of immunity to varicella should receive 2 doses or a second dose if she has previously received 1 dose. Pregnant females who do not have evidence of immunity should receive the first dose after pregnancy. This first dose should be obtained before  leaving the health care facility. The second dose should be obtained 4-8 weeks after the first dose. Human papillomavirus (HPV) vaccine. Females aged 13-26 years who have not received the vaccine previously should obtain the 3-dose series. The vaccine is not recommended for use in pregnant females. However, pregnancy testing is not needed before receiving a dose. If a female is found  be pregnant after receiving a dose, no treatment is needed. In that case, the remaining doses should be delayed until after the pregnancy. Immunization is recommended for any person with an immunocompromised condition through the age of 26 years if she did not get any or all doses earlier. During the 3-dose series, the second dose should be obtained 4-8 weeks after the first dose. The third dose should be obtained 24 weeks after the first dose and 16 weeks after the second dose. Zoster vaccine. One dose is recommended for adults aged 60 years or older unless certain conditions are present. Measles, mumps, and rubella (MMR) vaccine. Adults born before 1957 generally are considered immune to measles and mumps. Adults born in 1957 or later should have 1 or more doses of MMR vaccine unless there is a contraindication to the vaccine or there is laboratory evidence of immunity to each of the three diseases. A routine second dose of MMR vaccine should be obtained at least 28 days after the first dose for students attending postsecondary schools, health care workers, or international travelers. People who received inactivated measles vaccine or an unknown type of measles vaccine during 1963-1967 should receive 2 doses of MMR vaccine. People who received inactivated mumps vaccine or an unknown type of mumps vaccine before 1979 and are at high risk for mumps infection should consider immunization with 2 doses of MMR vaccine. For females of childbearing age, rubella immunity should be determined. If there is no evidence of immunity, females  who are not pregnant should be vaccinated. If there is no evidence of immunity, females who are pregnant should delay immunization until after pregnancy. Unvaccinated health care workers born before 1957 who lack laboratory evidence of measles, mumps, or rubella immunity or laboratory confirmation of disease should consider measles and mumps immunization with 2 doses of MMR vaccine or rubella immunization with 1 dose of MMR vaccine. Pneumococcal 13-valent conjugate (PCV13) vaccine. When indicated, a person who is uncertain of her immunization history and has no record of immunization should receive the PCV13 vaccine. An adult aged 19 years or older who has certain medical conditions and has not been previously immunized should receive 1 dose of PCV13 vaccine. This PCV13 should be followed with a dose of pneumococcal polysaccharide (PPSV23) vaccine. The PPSV23 vaccine dose should be obtained at least 1 or more year(s) after the dose of PCV13 vaccine. An adult aged 19 years or older who has certain medical conditions and previously received 1 or more doses of PPSV23 vaccine should receive 1 dose of PCV13. The PCV13 vaccine dose should be obtained 1 or more years after the last PPSV23 vaccine dose.  Pneumococcal polysaccharide (PPSV23) vaccine. When PCV13 is also indicated, PCV13 should be obtained first. All adults aged 65 years and older should be immunized. An adult younger than age 65 years who has certain medical conditions should be immunized. Any person who resides in a nursing home or long-term care facility should be immunized. An adult smoker should be immunized. People with an immunocompromised condition and certain other conditions should receive both PCV13 and PPSV23 vaccines. People with human immunodeficiency virus (HIV) infection should be immunized as soon as possible after diagnosis. Immunization during chemotherapy or radiation therapy should be avoided. Routine use of PPSV23 vaccine is not  recommended for American Indians, [place] Natives, or people younger than 65 years unless there are medical conditions that require PPSV23 vaccine. When indicated, people who have unknown immunization and have no record of immunization should receive   PPSV23 vaccine. One-time revaccination 5 years after the first dose of PPSV23 is recommended for people aged 19-64 years who have chronic kidney failure, nephrotic syndrome, asplenia, or immunocompromised conditions. People who received 1-2 doses of PPSV23 before age 65 years should receive another dose of PPSV23 vaccine at age 65 years or later if at least 5 years have passed since the previous dose. Doses of PPSV23 are not needed for people immunized with PPSV23 at or after age 65 years.  Preventive Services / Frequency  Ages 40 to 64 years Blood pressure check. Lipid and cholesterol check. Lung cancer screening. / Every year if you are aged 55-80 years and have a 30-pack-year history of smoking and currently smoke or have quit within the past 15 years. Yearly screening is stopped once you have quit smoking for at least 15 years or develop a health problem that would prevent you from having lung cancer treatment. Clinical breast exam.** / Every year after age 40 years.  BRCA-related cancer risk assessment.** / For women who have family members with a BRCA-related cancer (breast, ovarian, tubal, or peritoneal cancers). Mammogram.** / Every year beginning at age 40 years and continuing for as long as you are in good health. Consult with your health care provider. Pap test.** / Every 3 years starting at age 30 years through age 65 or 70 years with a history of 3 consecutive normal Pap tests. HPV screening.** / Every 3 years from ages 30 years through ages 65 to 70 years with a history of 3 consecutive normal Pap tests. Fecal occult blood test (FOBT) of stool. / Every year beginning at age 50 years and continuing until age 75 years. You may not need to do this  test if you get a colonoscopy every 10 years. Flexible sigmoidoscopy or colonoscopy.** / Every 5 years for a flexible sigmoidoscopy or every 10 years for a colonoscopy beginning at age 50 years and continuing until age 75 years. Hepatitis C blood test.** / For all people born from 1945 through 1965 and any individual with known risks for hepatitis C. Skin self-exam. / Monthly. Influenza vaccine. / Every year. Tetanus, diphtheria, and acellular pertussis (Tdap/Td) vaccine.** / Consult your health care provider. Pregnant women should receive 1 dose of Tdap vaccine during each pregnancy. 1 dose of Td every 10 years. Varicella vaccine.** / Consult your health care provider. Pregnant females who do not have evidence of immunity should receive the first dose after pregnancy. Zoster vaccine.** / 1 dose for adults aged 60 years or older. Pneumococcal 13-valent conjugate (PCV13) vaccine.** / Consult your health care provider. Pneumococcal polysaccharide (PPSV23) vaccine.** / 1 to 2 doses if you smoke cigarettes or if you have certain conditions. Meningococcal vaccine.** / Consult your health care provider. Hepatitis A vaccine.** / Consult your health care provider. Hepatitis B vaccine.** / Consult your health care provider. Screening for abdominal aortic aneurysm (AAA)  by ultrasound is recommended for people over 50 who have history of high blood pressure or who are current or former smokers. ++++++++++++++++++ Recommend Adult Low Dose Aspirin or  coated  Aspirin 81 mg daily  To reduce risk of Colon Cancer 40 %,  Skin Cancer 26 % ,  Melanoma 46%  and  Pancreatic cancer 60% +++++++++++++++++++ Vitamin D goal  is between 70-100.  Please make sure that you are taking your Vitamin D as directed.  It is very important as a natural anti-inflammatory  helping hair, skin, and nails, as well as reducing stroke and heart   attack risk.  It helps your bones and helps with mood. It also decreases numerous  cancer risks so please take it as directed.  Low Vit D is associated with a 200-300% higher risk for CANCER  and 200-300% higher risk for HEART   ATTACK  &  STROKE.   ...................................... It is also associated with higher death rate at younger ages,  autoimmune diseases like Rheumatoid arthritis, Lupus, Multiple Sclerosis.    Also many other serious conditions, like depression, Alzheimer's Dementia, infertility, muscle aches, fatigue, fibromyalgia - just to name a few. ++++++++++++++++++ Recommend the book "The END of DIETING" by Dr Joel Fuhrman  & the book "The END of DIABETES " by Dr Joel Fuhrman At [employer] - get book & Audio CD's    Being diabetic has a  300% increased risk for heart attack, stroke, cancer, and alzheimer- type vascular dementia. It is very important that you work harder with diet by avoiding all foods that are white. Avoid white rice (brown & wild rice is OK), white potatoes (sweetpotatoes in moderation is OK), White bread or wheat bread or anything made out of white flour like bagels, donuts, rolls, buns, biscuits, cakes, pastries, cookies, pizza crust, and pasta (made from white flour & egg whites) - vegetarian pasta or spinach or wheat pasta is OK. Multigrain breads like Arnold's or [employer], or multigrain sandwich thins or flatbreads.  Diet, exercise and weight loss can reverse and cure diabetes in the early stages.  Diet, exercise and weight loss is very important in the control and prevention of complications of diabetes which affects every system in your body, ie. Brain - dementia/stroke, eyes - glaucoma/blindness, heart - heart attack/heart failure, kidneys - dialysis, stomach - gastric paralysis, intestines - malabsorption, nerves - severe painful neuritis, circulation - gangrene & loss of a leg(s), and finally cancer and Alzheimers.    I recommend avoid fried & greasy foods,  sweets/candy, white rice (brown or wild rice or Quinoa is OK), white  potatoes (sweet potatoes are OK) - anything made from white flour - bagels, doughnuts, rolls, buns, biscuits,white and wheat breads, pizza crust and traditional pasta made of white flour & egg white(vegetarian pasta or spinach or wheat pasta is OK).  Multi-grain bread is OK - like multi-grain flat bread or sandwich thins. Avoid alcohol in excess. Exercise is also important.    Eat all the vegetables you want - avoid meat, especially red meat and dairy - especially cheese.  Cheese is the most concentrated form of trans-fats which is the worst thing to clog up our arteries. Veggie cheese is OK which can be found in the fresh produce section at [employer] or [employer] or [employer]  ++++++++++++++++++++++ DASH Eating Plan  DASH stands for "Dietary Approaches to Stop Hypertension."   The DASH eating plan is a healthy eating plan that has been shown to reduce high blood pressure (hypertension). Additional health benefits may include reducing the risk of type 2 diabetes mellitus, heart disease, and stroke. The DASH eating plan may also help with weight loss. WHAT DO I NEED TO KNOW ABOUT THE DASH EATING PLAN? For the DASH eating plan, you will follow these general guidelines: Choose foods with a percent daily value for sodium of less than 5% (as listed on the food label). Use salt-free seasonings or herbs instead of table salt or sea salt. Check with your health care provider or pharmacist before using salt substitutes. Eat lower-sodium products, often labeled as "lower sodium" or "no   salt added." Eat fresh foods. Eat more vegetables, fruits, and low-fat dairy products. Choose whole grains. Look for the word "whole" as the first word in the ingredient list. Choose fish  Limit sweets, desserts, sugars, and sugary drinks. Choose heart-healthy fats. Eat veggie cheese  Eat more home-cooked food and less restaurant, buffet, and fast food. Limit fried foods. Cook foods using methods other than  frying. Limit canned vegetables. If you do use them, rinse them well to decrease the sodium. When eating at a restaurant, ask that your food be prepared with less salt, or no salt if possible.                      WHAT FOODS CAN I EAT? Read Dr Joel Fuhrman's books on The End of Dieting & The End of Diabetes  Grains Whole grain or whole wheat bread. Brown rice. Whole grain or whole wheat pasta. Quinoa, bulgur, and whole grain cereals. Low-sodium cereals. Corn or whole wheat flour tortillas. Whole grain cornbread. Whole grain crackers. Low-sodium crackers.  Vegetables Fresh or frozen vegetables (raw, steamed, roasted, or grilled). Low-sodium or reduced-sodium tomato and vegetable juices. Low-sodium or reduced-sodium tomato sauce and paste. Low-sodium or reduced-sodium canned vegetables.   Fruits All fresh, canned (in natural juice), or frozen fruits.  Protein Products  All fish and seafood.  Dried beans, peas, or lentils. Unsalted nuts and seeds. Unsalted canned beans.  Dairy Low-fat dairy products, such as skim or 1% milk, 2% or reduced-fat cheeses, low-fat ricotta or cottage cheese, or plain low-fat yogurt. Low-sodium or reduced-sodium cheeses.  Fats and Oils Tub margarines without trans fats. Light or reduced-fat mayonnaise and salad dressings (reduced sodium). Avocado. Safflower, olive, or canola oils. Natural peanut or almond butter.  Other Unsalted popcorn and pretzels. The items listed above may not be a complete list of recommended foods or beverages. Contact your dietitian for more options.  ++++++++++++++++++  WHAT FOODS ARE NOT RECOMMENDED? Grains/ White flour or wheat flour White bread. White pasta. White rice. Refined cornbread. Bagels and croissants. Crackers that contain trans fat.  Vegetables  Creamed or fried vegetables. Vegetables in a . Regular canned vegetables. Regular canned tomato sauce and paste. Regular tomato and vegetable juices.  Fruits Dried fruits.  Canned fruit in light or heavy syrup. Fruit juice.  Meat and Other Protein Products Meat in general - RED meat & White meat.  Fatty cuts of meat. Ribs, chicken wings, all processed meats as bacon, sausage, bologna, salami, fatback, hot dogs, bratwurst and packaged luncheon meats.  Dairy Whole or 2% milk, cream, half-and-half, and cream cheese. Whole-fat or sweetened yogurt. Full-fat cheeses or blue cheese. Non-dairy creamers and whipped toppings. Processed cheese, cheese spreads, or cheese curds.  Condiments Onion and garlic salt, seasoned salt, table salt, and sea salt. Canned and packaged gravies. Worcestershire sauce. Tartar sauce. Barbecue sauce. Teriyaki sauce. Soy sauce, including reduced sodium. Steak sauce. Fish sauce. Oyster sauce. Cocktail sauce. Horseradish. Ketchup and mustard. Meat flavorings and tenderizers. Bouillon cubes. Hot sauce. Tabasco sauce. Marinades. Taco seasonings. Relishes.  Fats and Oils Butter, stick margarine, lard, shortening and bacon fat. Coconut, palm kernel, or palm oils. Regular salad dressings.  Pickles and olives. Salted popcorn and pretzels.  The items listed above may not be a complete list of foods and beverages to avoid.   

## 2021-01-07 LAB — LIPID PANEL
Cholesterol: 186 mg/dL (ref <200)
HDL: 52 mg/dL (ref >=50)
LDL Cholesterol (Calc): 114 mg/dL (calc) — ABNORMAL HIGH
Non-HDL Cholesterol (Calc): 134 mg/dL (calc) — ABNORMAL HIGH (ref <130)
Total CHOL/HDL Ratio: 3.6 (calc) (ref <5.0)
Triglycerides: 94 mg/dL (ref <150)

## 2021-01-07 LAB — URINALYSIS, ROUTINE W REFLEX MICROSCOPIC
Bilirubin Urine: NEGATIVE
Glucose, UA: NEGATIVE
Hgb urine dipstick: NEGATIVE
Ketones, ur: NEGATIVE
Leukocytes,Ua: NEGATIVE
Nitrite: NEGATIVE
Protein, ur: NEGATIVE
Specific Gravity, Urine: 1.006 (ref 1.001–1.035)
pH: 7 (ref 5.0–8.0)

## 2021-01-07 LAB — CBC WITH DIFFERENTIAL/PLATELET
Absolute Monocytes: 716 cells/uL (ref 200–950)
Basophils Absolute: 68 cells/uL (ref 0–200)
Basophils Relative: 0.5 %
Eosinophils Absolute: 257 cells/uL (ref 15–500)
Eosinophils Relative: 1.9 %
HCT: 37.6 % (ref 35.0–45.0)
Hemoglobin: 12.3 g/dL (ref 11.7–15.5)
Lymphs Abs: 3024 cells/uL (ref 850–3900)
MCH: 26.6 pg — ABNORMAL LOW (ref 27.0–33.0)
MCHC: 32.7 g/dL (ref 32.0–36.0)
MCV: 81.2 fL (ref 80.0–100.0)
MPV: 10.1 fL (ref 7.5–12.5)
Monocytes Relative: 5.3 %
Neutro Abs: 9437 cells/uL — ABNORMAL HIGH (ref 1500–7800)
Neutrophils Relative %: 69.9 %
Platelets: 433 10*3/uL — ABNORMAL HIGH (ref 140–400)
RBC: 4.63 10*6/uL (ref 3.80–5.10)
RDW: 13.5 % (ref 11.0–15.0)
Total Lymphocyte: 22.4 %
WBC: 13.5 10*3/uL — ABNORMAL HIGH (ref 3.8–10.8)

## 2021-01-07 LAB — IRON, TOTAL/TOTAL IRON BINDING CAP
%SAT: 14 % (calc) — ABNORMAL LOW (ref 16–45)
Iron: 48 ug/dL (ref 40–190)
TIBC: 332 mcg/dL (calc) (ref 250–450)

## 2021-01-07 LAB — COMPLETE METABOLIC PANEL WITH GFR
AG Ratio: 1.2 (calc) (ref 1.0–2.5)
ALT: 13 U/L (ref 6–29)
AST: 12 U/L (ref 10–35)
Albumin: 4.2 g/dL (ref 3.6–5.1)
Alkaline phosphatase (APISO): 83 U/L (ref 31–125)
BUN: 9 mg/dL (ref 7–25)
CO2: 25 mmol/L (ref 20–32)
Calcium: 9.4 mg/dL (ref 8.6–10.2)
Chloride: 101 mmol/L (ref 98–110)
Creat: 0.57 mg/dL (ref 0.50–0.99)
Globulin: 3.4 g/dL (calc) (ref 1.9–3.7)
Glucose, Bld: 77 mg/dL (ref 65–99)
Potassium: 4 mmol/L (ref 3.5–5.3)
Sodium: 137 mmol/L (ref 135–146)
Total Bilirubin: 0.4 mg/dL (ref 0.2–1.2)
Total Protein: 7.6 g/dL (ref 6.1–8.1)
eGFR: 112 mL/min/{1.73_m2} (ref >=60)

## 2021-01-07 LAB — HEMOGLOBIN A1C
Hgb A1c MFr Bld: 6.2 % of total Hgb — ABNORMAL HIGH (ref <5.7)
Mean Plasma Glucose: 131 mg/dL
eAG (mmol/L): 7.3 mmol/L

## 2021-01-07 LAB — VITAMIN D 25 HYDROXY (VIT D DEFICIENCY, FRACTURES): Vit D, 25-Hydroxy: 36 ng/mL (ref 30–100)

## 2021-01-07 LAB — MAGNESIUM: Magnesium: 1.8 mg/dL (ref 1.5–2.5)

## 2021-01-07 LAB — TSH: TSH: 3.04 mIU/L

## 2021-01-07 LAB — INSULIN, RANDOM: Insulin: 11.5 u[IU]/mL

## 2021-01-07 LAB — MICROALBUMIN / CREATININE URINE RATIO
Creatinine, Urine: 33 mg/dL (ref 20–275)
Microalb Creat Ratio: 6 mcg/mg creat (ref <30)
Microalb, Ur: 0.2 mg/dL

## 2021-01-07 LAB — VITAMIN B12: Vitamin B-12: 467 pg/mL (ref 200–1100)

## 2021-01-09 NOTE — Progress Notes (Signed)
============================================================ - Test results slightly outside the reference range are not unusual. If there is anything important, I will review this with you,  otherwise it is considered normal test values.  If you have further questions,  please do not hesitate to contact me at the office or via My Chart.  ============================================================ ============================================================  -  Iron levels are low - Recommend take an OTC Iron supplement daily & also   -  Eat ore Veggies with Iron as Carrots, Beets , all leafy green veggies as   Spinach, Collards,  Turnip - Mustard or Mixed Greens, Kale, Asparagus,   Broccoli, Brussel Sprouts, Green Beans / peas,                                                               Soybeans, Lentils, Sweet Potatoes ============================================================ ============================================================  -  -  Vitamin B12 =  467     Very Low  (Ideal or Goal Vit B12 is between 500 - 1,100)   Low Vit B12 may be associated with Anemia , Fatigue,   Peripheral Neuropathy, Dementia, "Brain Fog",  Psychosis & Depression  - Recommend take a sub-lingual form of Vitamin B12 tablet   1,000 to 5,000 mcg tab that you dissolve under your tongue /Daily   - Can get Lavonia Dana - best price at [employer] or on [employer] ============================================================ ============================================================  - CBC shows Normal  Hgb or Red Blood Cell count ,  But WBC is slightly elevated which suggests recent mild infection  ============================================================ ============================================================  - Total Chol = 186 is Great ,                           but the bad / Dangerous LDL Cholesterol  = 114  - is too high                                                ( Ideal or  Goal is less than 70  !  )    - Recommend a stricter  low cholesterol diet   - Cholesterol only comes from animal sources  - ie. meat, dairy, egg yolks  - Eat all the vegetables you want.  - Avoid meat, especially red meat - Beef AND Pork .  - Avoid cheese & dairy - milk & ice cream.     - Cheese is the most concentrated form of trans-fats which  is the worst thing to clog up our arteries.   - Veggie cheese is OK which can be found in the fresh  produce section at [employer] or [employer] or [employer] ============================================================ ============================================================  - A1c = 6.2% - is unfortunately creeping up,                     so will need to considering a weekly shot  for Diabetes to also help with weight loss . ============================================================ ============================================================  - Vitamin D = 36 - Is Dangerously LOW !   - Vitamin D goal is between 70-100.   - Please INCREASE your Vitamin D 5,000 unit caps to                                                                               2 capsules  = 10,000 units /day   - It is very important as a natural anti-inflammatory and helping the  immune system protect against viral infections, like the Covid-19    - also helping hair, skin, and nails, as well as reducing stroke and  heart attack risk.   - It helps your bones and helps with mood.  - It also decreases numerous cancer risks so please  take it as directed.   - Low Vit D is associated with a 200-300% higher risk for  CANCER   and 200-300% higher risk for HEART   ATTACK  &  STROKE.    - It is also associated with higher death rate at younger ages,   autoimmune diseases like Rheumatoid arthritis, Lupus,  Multiple Sclerosis.     - Also many other serious conditions, like depression,  Alzheimer's  Dementia, infertility, muscle aches, fatigue, fibromyalgia   - just to name a few. ============================================================ ============================================================

## 2021-02-14 ENCOUNTER — Emergency Department (INDEPENDENT_AMBULATORY_CARE_PROVIDER_SITE_OTHER)
Admission: EM | Admit: 2021-02-14 | Discharge: 2021-02-14 | Disposition: A | Discharge status: Home / Self Care | Payer: 59 | Source: Home / Self Care

## 2021-02-14 ENCOUNTER — Other Ambulatory Visit: Payer: Self-pay

## 2021-02-14 ENCOUNTER — Telehealth: Payer: 59 | Admitting: Nurse Practitioner

## 2021-02-14 DIAGNOSIS — R059 Cough, unspecified: Secondary | ICD-10-CM | POA: Diagnosis not present

## 2021-02-14 DIAGNOSIS — J111 Influenza due to unidentified influenza virus with other respiratory manifestations: Secondary | ICD-10-CM | POA: Diagnosis not present

## 2021-02-14 DIAGNOSIS — R6889 Other general symptoms and signs: Secondary | ICD-10-CM

## 2021-02-14 DIAGNOSIS — J029 Acute pharyngitis, unspecified: Secondary | ICD-10-CM | POA: Diagnosis not present

## 2021-02-14 LAB — POCT INFLUENZA A/B
Influenza A, POC: NEGATIVE
Influenza B, POC: NEGATIVE

## 2021-02-14 MED ORDER — AZITHROMYCIN 250 MG PO TABS
250.0000 mg | ORAL_TABLET | Freq: Every day | ORAL | 0 refills | Status: AC
Start: 2021-02-14 — End: ?

## 2021-02-14 MED ORDER — BENZONATATE 200 MG PO CAPS: 200.0000 mg | ORAL_CAPSULE | Freq: Three times a day (TID) | ORAL | 0 refills | Status: AC | PRN

## 2021-02-14 MED ORDER — PREDNISONE 20 MG PO TABS: ORAL_TABLET | ORAL | 0 refills | Status: AC

## 2021-02-14 MED ORDER — OSELTAMIVIR PHOSPHATE 75 MG PO CAPS: 75.0000 mg | ORAL_CAPSULE | Freq: Two times a day (BID) | ORAL | 0 refills | Status: DC

## 2021-02-14 NOTE — Discharge Instructions (Addendum)
Advised patient to take medication as directed with food to completion.  Advised patient may take Tessalon Perles daily or as needed for cough.  Encouraged patient to increase daily water intake while taking these medications. 

## 2021-02-14 NOTE — Progress Notes (Signed)
°  Based on what you shared with me, I feel your condition warrants further evaluation and I recommend that you be seen in a face to face visit. °  °NOTE: There will be NO CHARGE for this eVisit °  °If you are having a true medical emergency please call 911.   °  ° For an urgent face to face visit, [place] has six urgent care centers for your convenience:  °  ° [facility] [place] °Get Driving Directions °[phone] °[address] °[place], [place] [address] °  ° [facility] ([place]) °Get Driving Directions °[phone] °[address] °[place], [place] [address] ° °[facility] ([place] [facility] [place]) °Get Driving Directions °[phone] °[address] °[place],  [place]  [address] ° °[facility] °Get Driving Directions °[phone] °[address] °[place], [place] [address] °  °[facility] [place] °Get Driving Directions  °[phone] °[address] °[address] °[place], [place] [address] °  °[facility] [place] °Get Driving Directions °[phone] °[address] °[place],  [address] ° °Your MyChart E-visit questionnaire answers were reviewed by a board certified advanced clinical practitioner to complete your personal care plan based on your specific symptoms.  Thank you for using e-Visits. °

## 2021-02-14 NOTE — ED Triage Notes (Signed)
Pt states that she has a sore throat, headache, nausea, diarrhea and cough. X2 days   Pt states that she is vaccinated for covid. Pt states that she has had flu vaccine.  Pt states that she had a negative covid test 1/22.

## 2021-02-14 NOTE — ED Provider Notes (Signed)
[facility]    CSN: [id number] Arrival date & time: 02/14/21  0962      History   Chief Complaint Chief Complaint  Patient presents with   Sore Throat    Sore throat, cough, nausea, and headache. X2 days    HPI Emily Gay is a 51 y.o. female.   HPI 51 year old female presents with sore throat, cough, nausea and headache for 2 days.  PMH significant for HTN and OSA.  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   Obesity    OSA on CPAP    does not use CPAP    Patient Active Problem List   Diagnosis Date Noted   Primary osteoarthritis of right knee 04/07/2020   Intractable migraine with aura without status migrainosus 10/21/2019   Insomnia due to psychological stress 10/06/2019   PLMD (periodic limb movement disorder) 10/06/2019   Restless legs syndrome (RLS) 10/06/2019   Radiculopathy 10/22/2014   OSA (obstructive sleep apnea) 10/17/2013   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   Headache 08/13/2013   GERD 05/16/2013   Hyperlipidemia 05/16/2013   Abnormal glucose 05/16/2013   Medication management 05/16/2013   Insulin resistance syndrome 05/16/2013   Vitamin D deficiency 02/21/2013   TMJ PAIN 11/24/2009   ANEMIA, MILD 08/19/2009   Class 3 severe obesity due to excess calories without serious comorbidity with body mass index (BMI) of 50.0 to 59.9 in adult (HCC) 10/14/2008   Essential hypertension 10/14/2008   Allergic rhinitis 10/13/2008    Past Surgical History:  Procedure Laterality Date   ANTERIOR LAT LUMBAR FUSION N/A 10/22/2014   Procedure: ANTERIOR LATERAL LUMBAR FUSION 1 LEVEL;  Surgeon: Estill Bamberg, MD;  Location: [facility] OR;  Service: Orthopedics;  Laterality: N/A;   CHOLECYSTECTOMY  Feb. 2011    Gall Bladder   ESOPHAGOGASTRODUODENOSCOPY (EGD) WITH PROPOFOL N/A 06/09/2016   Procedure: ESOPHAGOGASTRODUODENOSCOPY (EGD) WITH PROPOFOL;  Surgeon: Napoleon Form, MD;  Location: [facility];  Service: Endoscopy;  Laterality: N/A;    OB History   No obstetric history on file.      Home Medications    Prior to Admission medications   Medication Sig Start Date End Date Taking? Authorizing Provider  azithromycin (ZITHROMAX) 250 MG tablet Take 1 tablet (250 mg total) by mouth daily. Take first 2 tablets together, then 1 every day until finished. 02/14/21  Yes Trevor Iha, FNP  benzonatate (TESSALON) 200 MG capsule Take 1 capsule (200 mg total) by mouth 3 (three) times daily as needed for up to 7 days for cough. 02/14/21 02/21/21 Yes Trevor Iha, FNP  carboxymethylcellulose 1 % ophthalmic solution Apply 1 drop to eye 3 (three) times daily. 04/03/19  Yes Lamptey, Britta Mccreedy, MD  Cholecalciferol (VITAMIN D PO) Take 5,000 Units by mouth daily.    Yes [provider]  fluticasone (FLONASE) 50 MCG/ACT nasal spray Place 2 sprays into both nostrils at bedtime. 10/24/18  Yes Quentin Mulling R, PA-C  levocetirizine (XYZAL) 5 MG tablet Take 1 tablet (5 mg total) by mouth daily. 10/24/18  Yes Doree Albee, PA-C  meloxicam (MOBIC) 15 MG tablet One tab PO qAM with a meal for 2 weeks, then daily prn pain. 04/07/20  Yes Thekkekandam,  Ihor Austinhomas J, MD  Multiple Vitamin (MULTIVITAMIN) capsule Take 1 capsule by mouth daily.   Yes [provider]  oseltamivir (TAMIFLU) 75 MG capsule Take 1 capsule (75 mg total) by mouth every 12 (twelve) hours. 02/14/21  Yes Trevor Ihaagan, Timouthy Gilardi, FNP  pantoprazole (PROTONIX) 40 MG tablet Take      1 tablet       Daily        to Prevent  Indigestion & Heartburn 01/18/20  Yes Lucky CowboyMcKeown, William, MD  predniSONE (DELTASONE) 20 MG tablet Take 3 tabs PO daily x 5 days. 02/14/21  Yes Trevor Ihaagan, Skylen Spiering, FNP  pregabalin (LYRICA) 150 MG capsule TAKE 1 CAPSULE(S) BY MOUTH THREE TIMES  A DAY FOR CHRONIC PAIN 10/06/20  Yes Revonda HumphreyMull, Dana W, NP  SUMAtriptan (IMITREX) 100 MG tablet TAKE ONE TABLET BY MOUTH TWICE A DAY AS NEEDED FOR UP TO 1 DOSE FOR MIGRAINE 11/09/20  Yes Revonda HumphreyMull, Dana W, NP  verapamil (CALAN-SR) 180 MG CR tablet Take  1 tablet  every night  for Migraine Prophylaxis 01/06/21  Yes Lucky CowboyMcKeown, William, MD  levothyroxine (SYNTHROID) 50 MCG tablet TAKE 1/2 TABLET BY MOUTH DAILY Patient not taking: Reported on 01/06/2021 11/02/20   Lucky CowboyMcKeown, William, MD  sucralfate (CARAFATE) 1 g tablet Take 1 tablet (1 g total) by mouth 4 (four) times daily -  with meals and at bedtime. 09/18/19 07/28/20  Napoleon FormNandigam, Kavitha V, MD    Family History Family History  Problem Relation Age of Onset   Cancer Mother        lyphoma   Lung disease Mother    Arthritis Father    Hypertension Father    Barrett's esophagus Father    Melanoma Sister    Fibromyalgia Sister    Mental illness Other        emotional illness (other blood relative)   Stroke Other        Stroke (parent and grandparent)   Heart disease Other        (grandparent)   Hypertension Other        (other blood relative)   Arthritis Other        Parent and grandparent   Colon cancer Neg Hx    Rectal cancer Neg Hx    Stomach cancer Neg Hx    Esophageal cancer Neg Hx    Liver cancer Neg Hx     Social History Social History   Tobacco Use   Smoking status: Never   Smokeless tobacco: Never  Vaping Use   Vaping Use: Never used  Substance Use Topics   Alcohol use: Not Currently    Comment: occasionally   Drug use: No     Allergies   Patient has no known allergies.   Review of Systems Review of Systems  HENT:  Positive for sore throat.   Respiratory:  Positive for cough.   Gastrointestinal:  Positive for diarrhea and nausea.  Neurological:  Positive for headaches.    Physical Exam Triage Vital Signs ED Triage Vitals  Enc Vitals Group     BP 02/14/21 0939 116/80     Pulse Rate 02/14/21 0939 (!) 113     Resp  02/14/21 0939 18     Temp 02/14/21 0939 98.7 F (37.1 C)     Temp Source 02/14/21 0939 Oral     SpO2 02/14/21 0939 97 %     Weight 02/14/21 0936 300 lb (136.1 kg)     Height 02/14/21 0936 5\' 4"  (1.626 m)  Head Circumference --      Peak Flow --      Pain Score 02/14/21 0936 10     Pain Loc --      Pain Edu? --      Excl. in GC? --    No data found.  Updated Vital Signs BP 116/80 (BP Location: Left Arm)    Pulse (!) 113    Temp 98.7 F (37.1 C) (Oral)    Resp 18    Ht 5\' 4"  (1.626 m)    Wt 300 lb (136.1 kg)    LMP 01/01/2021    SpO2 97%    BMI 51.49 kg/m  Physical Exam Vitals and nursing note reviewed.  Constitutional:      General: She is not in acute distress.    Appearance: She is obese. She is ill-appearing.  HENT:     Right Ear: Tympanic membrane and ear canal normal.     Left Ear: Tympanic membrane and ear canal normal.     Mouth/Throat:     Mouth: Mucous membranes are moist.     Pharynx: Oropharynx is clear. Uvula midline. Posterior oropharyngeal erythema and uvula swelling present.     Tonsils: No tonsillar exudate. 0 on the right. 0 on the left.  Eyes:     Conjunctiva/sclera: Conjunctivae normal.     Pupils: Pupils are equal, round, and reactive to light.  Cardiovascular:     Rate and Rhythm: Normal rate and regular rhythm.     Heart sounds: Normal heart sounds.  Pulmonary:     Effort: Pulmonary effort is normal.     Breath sounds: Normal breath sounds.  Musculoskeletal:     Cervical back: Normal range of motion and neck supple.  Skin:    General: Skin is warm and dry.  Neurological:     General: No focal deficit present.     Mental Status: She is alert and oriented to person, place, and time.     UC Treatments / Results  Labs (all labs ordered are listed, but only abnormal results are displayed) Labs Reviewed  POCT INFLUENZA A/B - Normal    EKG   Radiology No results found.  Procedures Procedures (including critical care  time)  Medications Ordered in UC Medications - No data to display  Initial Impression / Assessment and Plan / UC Course  I have reviewed the triage vital signs and the nursing notes.  Pertinent labs & imaging results that were available during my care of the patient were reviewed by me and considered in my medical decision making (see chart for details).     MDM: 1.  Influenza-like illness-we will treat empirically with Tamiflu; 2.  Pharyngitis-Rx'd Zithromax; 3.  Cough-Rx'd Prednisone and Tessalon Perles. Advised patient to take medication as directed with food to completion.  Advised patient may take Tessalon Perles daily or as needed for cough.  Encouraged patient to increase daily water intake while taking these medications.  Work note provided per request prior to discharge.  Patient discharged home, hemodynamically stable. Final Clinical Impressions(s) / UC Diagnoses   Final diagnoses:  Influenza-like illness  Pharyngitis, unspecified etiology  Cough, unspecified type     Discharge Instructions      Advised patient to take medication as directed with food to completion.  Advised patient may take Tessalon Perles daily or as needed for cough.  Encouraged patient to increase daily water intake while taking these medications.     ED Prescriptions  Medication Sig Dispense Auth. Provider   oseltamivir (TAMIFLU) 75 MG capsule Take 1 capsule (75 mg total) by mouth every 12 (twelve) hours. 10 capsule Trevor Ihaagan, Mavis Gravelle, FNP   benzonatate (TESSALON) 200 MG capsule Take 1 capsule (200 mg total) by mouth 3 (three) times daily as needed for up to 7 days for cough. 40 capsule Trevor Ihaagan, Caroly Purewal, FNP   predniSONE (DELTASONE) 20 MG tablet Take 3 tabs PO daily x 5 days. 15 tablet Trevor Ihaagan, Teoman Giraud, FNP   azithromycin (ZITHROMAX) 250 MG tablet Take 1 tablet (250 mg total) by mouth daily. Take first 2 tablets together, then 1 every day until finished. 6 tablet Trevor Ihaagan, Michal Callicott, FNP      PDMP not  reviewed this encounter.   Trevor IhaRagan, Tanna Loeffler, FNP 02/14/21 1117

## 2021-02-16 ENCOUNTER — Telehealth: Payer: 59 | Admitting: Physician Assistant

## 2021-02-16 ENCOUNTER — Telehealth: Payer: Self-pay

## 2021-02-16 DIAGNOSIS — U071 COVID-19: Secondary | ICD-10-CM | POA: Diagnosis not present

## 2021-02-16 MED ORDER — ONDANSETRON 4 MG PO TBDP: 4.0000 mg | ORAL_TABLET | Freq: Three times a day (TID) | ORAL | 0 refills | Status: AC | PRN

## 2021-02-16 MED ORDER — ALBUTEROL SULFATE HFA 108 (90 BASE) MCG/ACT IN AERS: 2.0000 | INHALATION_SPRAY | Freq: Four times a day (QID) | RESPIRATORY_TRACT | 0 refills | Status: AC | PRN

## 2021-02-16 MED ORDER — MOLNUPIRAVIR EUA 200MG CAPSULE: 4.0000 | ORAL_CAPSULE | Freq: Two times a day (BID) | ORAL | 0 refills | Status: AC

## 2021-02-16 NOTE — Progress Notes (Signed)
Virtual Visit Consent   Emily Gay, you are scheduled for a virtual visit with a [facility] provider today.     Just as with appointments in the office, your consent must be obtained to participate.  Your consent will be active for this visit and any virtual visit you may have with one of our providers in the next 365 days.     If you have a MyChart account, a copy of this consent can be sent to you electronically.  All virtual visits are billed to your insurance company just like a traditional visit in the office.    As this is a virtual visit, video technology does not allow for your provider to perform a traditional examination.  This may limit your provider's ability to fully assess your condition.  If your provider identifies any concerns that need to be evaluated in person or the need to arrange testing (such as labs, EKG, etc.), we will make arrangements to do so.     Although advances in technology are sophisticated, we cannot ensure that it will always work on either your end or our end.  If the connection with a video visit is poor, the visit may have to be switched to a telephone visit.  With either a video or telephone visit, we are not always able to ensure that we have a secure connection.     I need to obtain your verbal consent now.   Are you willing to proceed with your visit today?    Emily Gay has provided verbal consent on 02/16/2021 for a virtual visit (video or telephone).   Leeanne Rio, [place]   Date: 02/16/2021 9:54 AM   Virtual Visit via Video Note   I, Leeanne Rio, connected with  Emily Gay  ([id number][id number]) on 02/16/21 at  9:30 AM EST by a video-enabled telemedicine application and verified that I am speaking with the correct person using two identifiers.  Location: Patient: Virtual Visit Location Patient: Home Provider: Virtual Visit Location Provider: Home Office   I discussed the limitations of evaluation and management by  telemedicine and the availability of in person appointments. The patient expressed understanding and agreed to proceed.    History of Present Illness: Emily Gay is a 51 y.o. who identifies as a female who was assigned female at birth, and is being seen today for COVID-19. Notes symptoms starting about 3 days ago. Was initially evaluated at Urgent Care and tested negative for the flu. Was not COVID tested at that time as she had done a home test that morning that was negative. Was treated empirically for the flu with Tamiflu, Azithromycin, Prednisone and Tessalon. Notes continued and progressing symptoms of congestion, cough, windedness with exertion, fatigue, low-grade fever, despite treatments. Took a repeat COVID test last night which was positive. Notes with her fever, she is keeping under control with Tylenol. Windedness is only with significant exertion. Denies chest pain. Some nausea without vomiting now but causing anorexia.   HPI: HPI  Problems:  Patient Active Problem List   Diagnosis Date Noted   Primary osteoarthritis of right knee 04/07/2020   Intractable migraine with aura without status migrainosus 10/21/2019   Insomnia due to psychological stress 10/06/2019   PLMD (periodic limb movement disorder) 10/06/2019   Restless legs syndrome (RLS) 10/06/2019   Radiculopathy 10/22/2014   OSA (obstructive sleep apnea) 10/17/2013   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   Headache 08/13/2013   GERD 05/16/2013  Hyperlipidemia 05/16/2013   Abnormal glucose 05/16/2013   Medication management 05/16/2013   Insulin resistance syndrome 05/16/2013   Vitamin D deficiency 02/21/2013   TMJ PAIN 11/24/2009   ANEMIA, MILD 08/19/2009   Class 3 severe obesity due to excess calories without serious comorbidity with body mass index (BMI) of 50.0 to 59.9 in adult [facility]) 10/14/2008   Essential hypertension 10/14/2008   Allergic rhinitis 10/13/2008    Allergies: No Known Allergies Medications:  Current  Outpatient Medications:    molnupiravir EUA (LAGEVRIO) 200 mg CAPS capsule, Take 4 capsules (800 mg total) by mouth 2 (two) times daily for 5 days., Disp: 40 capsule, Rfl: 0   azithromycin (ZITHROMAX) 250 MG tablet, Take 1 tablet (250 mg total) by mouth daily. Take first 2 tablets together, then 1 every day until finished., Disp: 6 tablet, Rfl: 0   benzonatate (TESSALON) 200 MG capsule, Take 1 capsule (200 mg total) by mouth 3 (three) times daily as needed for up to 7 days for cough., Disp: 40 capsule, Rfl: 0   carboxymethylcellulose 1 % ophthalmic solution, Apply 1 drop to eye 3 (three) times daily., Disp: 30 mL, Rfl: 12   Cholecalciferol (VITAMIN D PO), Take 5,000 Units by mouth daily. , Disp: , Rfl:    fluticasone (FLONASE) 50 MCG/ACT nasal spray, Place 2 sprays into both nostrils at bedtime., Disp: 16 g, Rfl: 3   levocetirizine (XYZAL) 5 MG tablet, Take 1 tablet (5 mg total) by mouth daily., Disp: 90 tablet, Rfl: 1   levothyroxine (SYNTHROID) 50 MCG tablet, TAKE 1/2 TABLET BY MOUTH DAILY (Patient not taking: Reported on 01/06/2021), Disp: 15 tablet, Rfl: 1   meloxicam (MOBIC) 15 MG tablet, One tab PO qAM with a meal for 2 weeks, then daily prn pain., Disp: 30 tablet, Rfl: 3   Multiple Vitamin (MULTIVITAMIN) capsule, Take 1 capsule by mouth daily., Disp: , Rfl:    pantoprazole (PROTONIX) 40 MG tablet, Take      1 tablet       Daily        to Prevent  Indigestion & Heartburn, Disp: 90 tablet, Rfl: 3   predniSONE (DELTASONE) 20 MG tablet, Take 3 tabs PO daily x 5 days., Disp: 15 tablet, Rfl: 0   pregabalin (LYRICA) 150 MG capsule, TAKE 1 CAPSULE(S) BY MOUTH THREE TIMES A DAY FOR CHRONIC PAIN, Disp: 270 capsule, Rfl: 1   sucralfate (CARAFATE) 1 g tablet, Take 1 tablet (1 g total) by mouth 4 (four) times daily -  with meals and at bedtime., Disp: 120 tablet, Rfl: 3   SUMAtriptan (IMITREX) 100 MG tablet, TAKE ONE TABLET BY MOUTH TWICE A DAY AS NEEDED FOR UP TO 1 DOSE FOR MIGRAINE, Disp: 10 tablet, Rfl:  3   verapamil (CALAN-SR) 180 MG CR tablet, Take  1 tablet  every night  for Migraine Prophylaxis, Disp: 90 tablet, Rfl: 3  Observations/Objective: Patient is well-developed, well-nourished in no acute distress.  Resting comfortably at home.  Head is normocephalic, atraumatic.  No labored breathing. Speech is clear and coherent with logical content.  Patient is alert and oriented at baseline.   Assessment and Plan: 1. COVID-19 - molnupiravir EUA (LAGEVRIO) 200 mg CAPS capsule; Take 4 capsules (800 mg total) by mouth 2 (two) times daily for 5 days.  Dispense: 40 capsule; Refill: 0 - MyChart COVID-19 home monitoring program; Future  Initially diagnosed and managed as influenza with tamilfu. Positive COVID test. Will have her stop Tamiflu. She was also given Azithromycin at Urgent  Care (unsure why) along with prednisone. Since she has started antibiotic she should complete entire course. For prednisone will reduce dose to 40 mg daily to lessen risk of immunosuppression while still giving benefits to breathing. Continue Tessalon.   Patient with multiple risk factors for complicated course of illness. Discussed risks/benefits of antiviral medications including most common potential ADRs. Patient voiced understanding and would like to proceed with antiviral medication. They are candidate for molnupiravir. Rx sent to pharmacy. Supportive measures, OTC medications and vitamin regimen reviewed. Patient has been enrolled in a MyChart COVID symptom monitoring program. Samule Dry reviewed in detail. Strict ER precautions discussed with patient.    Follow Up Instructions: I discussed the assessment and treatment plan with the patient. The patient was provided an opportunity to ask questions and all were answered. The patient agreed with the plan and demonstrated an understanding of the instructions.  A copy of instructions were sent to the patient via MyChart unless otherwise noted below.   The patient was  advised to call back or seek an in-person evaluation if the symptoms worsen or if the condition fails to improve as anticipated.  Time:  I spent 15 minutes with the patient via telehealth technology discussing the above problems/concerns.    Leeanne Rio, PA-C

## 2021-02-16 NOTE — Telephone Encounter (Signed)
BPA triggered for worsening symptoms SOB, vomiting. Patient called, left VM to return the call to speak to a nurse [phone]. Will send MyChart message as well with care advice below:   Shortness of breath is the same:   continue to monitor at home  If symptoms become severe, i.e. shortness of breath at rest, gasping for air, wheezing, call 911 and seek treatment in the ED  If appetite becomes worse:   encourage patient to drink fluids as tolerated, work their way up to bland solid food such as crackers, pretzels, soup, bread or applesauce and boiled starches.  If patient is unable to tolerate any foods or liquids, notify PCP.  If patient develops severe vomiting (more than 6 times a day and or >8 hours) and/or severe abdominal pain advise patient to call 911 and seek treatment in ED

## 2021-02-16 NOTE — Patient Instructions (Signed)
Emily Gay, thank you for joining Piedad Climes, PA-C for today's virtual visit.  While this provider is not your primary care provider (PCP), if your PCP is located in our provider database this encounter information will be shared with them immediately following your visit.  Consent: (Patient) Emily Gay provided verbal consent for this virtual visit at the beginning of the encounter.  Current Medications:  Current Outpatient Medications:    albuterol (VENTOLIN HFA) 108 (90 Base) MCG/ACT inhaler, Inhale 2 puffs into the lungs every 6 (six) hours as needed for wheezing or shortness of breath., Disp: 8 g, Rfl: 0   molnupiravir EUA (LAGEVRIO) 200 mg CAPS capsule, Take 4 capsules (800 mg total) by mouth 2 (two) times daily for 5 days., Disp: 40 capsule, Rfl: 0   ondansetron (ZOFRAN-ODT) 4 MG disintegrating tablet, Take 1 tablet (4 mg total) by mouth every 8 (eight) hours as needed for nausea or vomiting., Disp: 20 tablet, Rfl: 0   azithromycin (ZITHROMAX) 250 MG tablet, Take 1 tablet (250 mg total) by mouth daily. Take first 2 tablets together, then 1 every day until finished., Disp: 6 tablet, Rfl: 0   benzonatate (TESSALON) 200 MG capsule, Take 1 capsule (200 mg total) by mouth 3 (three) times daily as needed for up to 7 days for cough., Disp: 40 capsule, Rfl: 0   carboxymethylcellulose 1 % ophthalmic solution, Apply 1 drop to eye 3 (three) times daily., Disp: 30 mL, Rfl: 12   Cholecalciferol (VITAMIN D PO), Take 5,000 Units by mouth daily. , Disp: , Rfl:    fluticasone (FLONASE) 50 MCG/ACT nasal spray, Place 2 sprays into both nostrils at bedtime., Disp: 16 g, Rfl: 3   levocetirizine (XYZAL) 5 MG tablet, Take 1 tablet (5 mg total) by mouth daily., Disp: 90 tablet, Rfl: 1   levothyroxine (SYNTHROID) 50 MCG tablet, TAKE 1/2 TABLET BY MOUTH DAILY (Patient not taking: Reported on 01/06/2021), Disp: 15 tablet, Rfl: 1   meloxicam (MOBIC) 15 MG tablet, One tab PO qAM with a meal for 2 weeks,  then daily prn pain., Disp: 30 tablet, Rfl: 3   Multiple Vitamin (MULTIVITAMIN) capsule, Take 1 capsule by mouth daily., Disp: , Rfl:    pantoprazole (PROTONIX) 40 MG tablet, Take      1 tablet       Daily        to Prevent  Indigestion & Heartburn, Disp: 90 tablet, Rfl: 3   predniSONE (DELTASONE) 20 MG tablet, Take 3 tabs PO daily x 5 days., Disp: 15 tablet, Rfl: 0   pregabalin (LYRICA) 150 MG capsule, TAKE 1 CAPSULE(S) BY MOUTH THREE TIMES A DAY FOR CHRONIC PAIN, Disp: 270 capsule, Rfl: 1   sucralfate (CARAFATE) 1 g tablet, Take 1 tablet (1 g total) by mouth 4 (four) times daily -  with meals and at bedtime., Disp: 120 tablet, Rfl: 3   SUMAtriptan (IMITREX) 100 MG tablet, TAKE ONE TABLET BY MOUTH TWICE A DAY AS NEEDED FOR UP TO 1 DOSE FOR MIGRAINE, Disp: 10 tablet, Rfl: 3   verapamil (CALAN-SR) 180 MG CR tablet, Take  1 tablet  every night  for Migraine Prophylaxis, Disp: 90 tablet, Rfl: 3   Medications ordered in this encounter:  Meds ordered this encounter  Medications   molnupiravir EUA (LAGEVRIO) 200 mg CAPS capsule    Sig: Take 4 capsules (800 mg total) by mouth 2 (two) times daily for 5 days.    Dispense:  40 capsule    Refill:  0    Order Specific Question:   Supervising Provider    Answer:   Hyacinth Meeker, BRIAN [[id number]]   albuterol (VENTOLIN HFA) 108 (90 Base) MCG/ACT inhaler    Sig: Inhale 2 puffs into the lungs every 6 (six) hours as needed for wheezing or shortness of breath.    Dispense:  8 g    Refill:  0    Order Specific Question:   Supervising Provider    Answer:   MILLER, BRIAN [[id number]]   ondansetron (ZOFRAN-ODT) 4 MG disintegrating tablet    Sig: Take 1 tablet (4 mg total) by mouth every 8 (eight) hours as needed for nausea or vomiting.    Dispense:  20 tablet    Refill:  0    Order Specific Question:   Supervising Provider    Answer:   Hyacinth Meeker, BRIAN [[id number]]     *If you need refills on other medications prior to your next appointment, please contact your  pharmacy*  Follow-Up: Call back or seek an in-person evaluation if the symptoms worsen or if the condition fails to improve as anticipated.  Other Instructions Please keep well-hydrated and get plenty of rest. Start a saline nasal rinse to flush out your nasal passages.  Stop the Tamiflu. Decrease the Prednisone to 40 mg (2 tablets) daily until you finish 5 full days of the steroid. You can use plain Mucinex to help thin congestion. If you have a humidifier, running in the bedroom at night. I want you to start OTC vitamin D3 1000 units daily, vitamin C 1000 mg daily, and a zinc supplement.  Use the Zofran for nausea. The albuterol is to use as directed for chest tightness. Please take prescribed medications as directed.  You have been enrolled in a MyChart symptom monitoring program. Please answer these questions daily so we can keep track of how you are doing.  You were to quarantine for 5 days from onset of your symptoms.  After day 5, if you have had no fever and you are feeling better, you can end quarantine but need to mask for an additional 5 days. After day 5 if you have a fever or are having significant symptoms, please quarantine for full 10 days.  If you note any worsening of symptoms, any significant shortness of breath or any chest pain, please seek ER evaluation ASAP.  Please do not delay care!  COVID-19: What to Do if You Are Sick If you test positive and are an older adult or someone who is at high risk of getting very sick from COVID-19, treatment may be available. Contact a healthcare provider right away after a positive test to determine if you are eligible, even if your symptoms are mild right now. You can also visit a Test to Treat location and, if eligible, receive a prescription from a provider. Don't delay: Treatment must be started within the first few days to be effective. If you have a fever, cough, or other symptoms, you might have COVID-19. Most people have  mild illness and are able to recover at home. If you are sick: Keep track of your symptoms. If you have an emergency warning sign (including trouble breathing), call 911. Steps to help prevent the spread of COVID-19 if you are sick If you are sick with COVID-19 or think you might have COVID-19, follow the steps below to care for yourself and to help protect other people in your home and community. Stay home except to get medical care Stay home. Most  people with COVID-19 have mild illness and can recover at home without medical care. Do not leave your home, except to get medical care. Do not visit public areas and do not go to places where you are unable to wear a mask. Take care of yourself. Get rest and stay hydrated. Take over-the-counter medicines, such as acetaminophen, to help you feel better. Stay in touch with your doctor. Call before you get medical care. Be sure to get care if you have trouble breathing, or have any other emergency warning signs, or if you think it is an emergency. Avoid public transportation, ride-sharing, or taxis if possible. Get tested If you have symptoms of COVID-19, get tested. While waiting for test results, stay away from others, including staying apart from those living in your household. Get tested as soon as possible after your symptoms start. Treatments may be available for people with COVID-19 who are at risk for becoming very sick. Don't delay: Treatment must be started early to be effective--some treatments must begin within 5 days of your first symptoms. Contact your healthcare provider right away if your test result is positive to determine if you are eligible. Self-tests are one of several options for testing for the virus that causes COVID-19 and may be more convenient than laboratory-based tests and point-of-care tests. Ask your healthcare provider or your local health department if you need help interpreting your test results. You can visit your state,  tribal, local, and territorial health department's website to look for the latest local information on testing sites. Separate yourself from other people As much as possible, stay in a specific room and away from other people and pets in your home. If possible, you should use a separate bathroom. If you need to be around other people or animals in or outside of the home, wear a well-fitting mask. Tell your close contacts that they may have been exposed to COVID-19. An infected person can spread COVID-19 starting 48 hours (or 2 days) before the person has any symptoms or tests positive. By letting your close contacts know they may have been exposed to COVID-19, you are helping to protect everyone. See COVID-19 and Animals if you have questions about pets. If you are diagnosed with COVID-19, someone from the health department may call you. Answer the call to slow the spread. Monitor your symptoms Symptoms of COVID-19 include fever, cough, or other symptoms. Follow care instructions from your healthcare provider and local health department. Your local health authorities may give instructions on checking your symptoms and reporting information. When to seek emergency medical attention Look for emergency warning signs* for COVID-19. If someone is showing any of these signs, seek emergency medical care immediately: Trouble breathing Persistent pain or pressure in the chest New confusion Inability to wake or stay awake Pale, gray, or blue-colored skin, lips, or nail beds, depending on skin tone *This list is not all possible symptoms. Please call your medical provider for any other symptoms that are severe or concerning to you. Call 911 or call ahead to your local emergency facility: Notify the operator that you are seeking care for someone who has or may have COVID-19. Call ahead before visiting your doctor Call ahead. Many medical visits for routine care are being postponed or done by phone or  telemedicine. If you have a medical appointment that cannot be postponed, call your doctor's office, and tell them you have or may have COVID-19. This will help the office protect themselves and other patients. If  you are sick, wear a well-fitting mask You should wear a mask if you must be around other people or animals, including pets (even at home). Wear a mask with the best fit, protection, and comfort for you. You don't need to wear the mask if you are alone. If you can't put on a mask (because of trouble breathing, for example), cover your coughs and sneezes in some other way. Try to stay at least 6 feet away from other people. This will help protect the people around you. Masks should not be placed on young children under age 26 years, anyone who has trouble breathing, or anyone who is not able to remove the mask without help. Cover your coughs and sneezes Cover your mouth and nose with a tissue when you cough or sneeze. Throw away used tissues in a lined trash can. Immediately wash your hands with soap and water for at least 20 seconds. If soap and water are not available, clean your hands with an alcohol-based hand sanitizer that contains at least 60% alcohol. Clean your hands often Wash your hands often with soap and water for at least 20 seconds. This is especially important after blowing your nose, coughing, or sneezing; going to the bathroom; and before eating or preparing food. Use hand sanitizer if soap and water are not available. Use an alcohol-based hand sanitizer with at least 60% alcohol, covering all surfaces of your hands and rubbing them together until they feel dry. Soap and water are the best option, especially if hands are visibly dirty. Avoid touching your eyes, nose, and mouth with unwashed hands. Handwashing Tips Avoid sharing personal household items Do not share dishes, drinking glasses, cups, eating utensils, towels, or bedding with other people in your home. Wash  these items thoroughly after using them with soap and water or put in the dishwasher. Clean surfaces in your home regularly Clean and disinfect high-touch surfaces (for example, doorknobs, tables, handles, light switches, and countertops) in your "sick room" and bathroom. In shared spaces, you should clean and disinfect surfaces and items after each use by the person who is ill. If you are sick and cannot clean, a caregiver or other person should only clean and disinfect the area around you (such as your bedroom and bathroom) on an as needed basis. Your caregiver/other person should wait as long as possible (at least several hours) and wear a mask before entering, cleaning, and disinfecting shared spaces that you use. Clean and disinfect areas that may have blood, stool, or body fluids on them. Use household cleaners and disinfectants. Clean visible dirty surfaces with household cleaners containing soap or detergent. Then, use a household disinfectant. Use a product from [employer] N: Disinfectants for Coronavirus (COVID-19). Be sure to follow the instructions on the label to ensure safe and effective use of the product. Many products recommend keeping the surface wet with a disinfectant for a certain period of time (look at "contact time" on the product label). You may also need to wear personal protective equipment, such as gloves, depending on the directions on the product label. Immediately after disinfecting, wash your hands with soap and water for 20 seconds. For completed guidance on cleaning and disinfecting your home, visit Complete Disinfection Guidance. Take steps to improve ventilation at home Improve ventilation (air flow) at home to help prevent from spreading COVID-19 to other people in your household. Clear out COVID-19 virus particles in the air by opening windows, using air filters, and turning on fans in  your home. Use this interactive tool to learn how to improve air flow in your  home. When you can be around others after being sick with COVID-19 Deciding when you can be around others is different for different situations. Find out when you can safely end home isolation. For any additional questions about your care, contact your healthcare provider or state or local health department. 04/13/2020 Content source: [facility] for Immunization and Respiratory Diseases (NCIRD), Division of Viral Diseases This information is not intended to replace advice given to you by your health care provider. Make sure you discuss any questions you have with your health care provider. Document Revised: 05/27/2020 Document Reviewed: 05/27/2020 Elsevier Patient Education  2022 [employer]      If you have been instructed to have an in-person evaluation today at a local Urgent Care facility, please use the link below. It will take you to a list of all of our available [facility], including address, phone number and hours of operation. Please do not delay care.  [facility]  If you or a family member do not have a primary care provider, use the link below to schedule a visit and establish care. When you choose a [facility] primary care physician or advanced practice provider, you gain a long-term partner in health. Find a Primary Care Provider  Learn more about [facility]'s in-office and virtual care options: [facility] - Get Care Now

## 2021-02-17 ENCOUNTER — Ambulatory Visit: Payer: 59 | Admitting: Internal Medicine

## 2021-02-17 ENCOUNTER — Encounter: Payer: Self-pay | Admitting: *Deleted

## 2021-02-17 ENCOUNTER — Telehealth: Payer: Self-pay

## 2021-02-17 NOTE — Telephone Encounter (Signed)
This encounter was created in error - please disregard.

## 2021-02-17 NOTE — Telephone Encounter (Signed)
Patient returned call regarding BPA trigger for cough. Patient reports cough is not worse but continues and she is not able to sleep. Recommended to continue with prescribed tesslon for cough per directions. Hard candy or cough drops and drinking warm fluids. Adults can also [place] honey 2 tsp (10 ml) at bedtime. If coughing worsens contact her PCP . Patient verbalized understanding .

## 2021-02-17 NOTE — Telephone Encounter (Signed)
BPA triggered for worsening symptoms cough. Patient called, left VM to return the call to [phone] to speak to a nurse about MyChart Questionnaire. Will send MyChart message with care advice below:   If cough remains the same or better: continue to treat with over the counter medications.  Hard candy or cough drops and drinking warm fluids. Adults can also use honey 2 tsp (10 ML) at bedtime.   If cough is becoming worse even with the use of over the counter medications and patient is not able to sleep at night, cough becomes productive with sputum that maybe yellow or green in color, contact PCP.

## 2021-02-22 ENCOUNTER — Telehealth: Payer: Self-pay | Admitting: *Deleted

## 2021-02-22 NOTE — Telephone Encounter (Signed)
Filled out survey yesterday before she was vomiting. So on today's survey she added vomiting but it is actually over and she is able to keep food and liquids down. Encouraged to continue completing survey daily.

## 2021-02-25 ENCOUNTER — Telehealth: Payer: Self-pay | Admitting: *Deleted

## 2021-02-25 NOTE — Telephone Encounter (Signed)
Call to patient - worsening COVID symptoms: cough, weakness Patient feels worse today- did try to go back work this week- maybe too soon.  Patient is hoarse.She states cough is worse- bad last night- kept her up. She thinks cough is triggered by increased post nasal drip/ congestion- patient is using benadryl, prescribed cough medication for her symptoms. Patient has productive cough and congestion/sputum is mostly clear with yellowish tint. No fever.  Discussed risk of secondary infection with patient and advised virtual visit for her worsening symptoms. Increased fluid intake and continued monitoring of her symptoms.

## 2021-03-29 ENCOUNTER — Other Ambulatory Visit: Payer: Self-pay

## 2021-03-29 MED ORDER — PANTOPRAZOLE SODIUM 40 MG PO TBEC: DELAYED_RELEASE_TABLET | ORAL | 3 refills | Status: AC

## 2021-05-21 ENCOUNTER — Other Ambulatory Visit: Payer: Self-pay | Admitting: Nurse Practitioner

## 2021-06-03 IMAGING — US US BREAST*L* LIMITED INC AXILLA
1 series · 5 of 5 positions shown · non-contrast
Comparison: Previous exam(s).

CLINICAL DATA: Callback from screening mammogram for possible
asymmetry left breast

EXAM:
DIGITAL DIAGNOSTIC left MAMMOGRAM WITH CAD AND TOMO
ULTRASOUND left BREAST

[Series 1: us breast*left* limited inc axilla · 0.09mm/px · 5 of 5 slices shown]
[im 1/5]
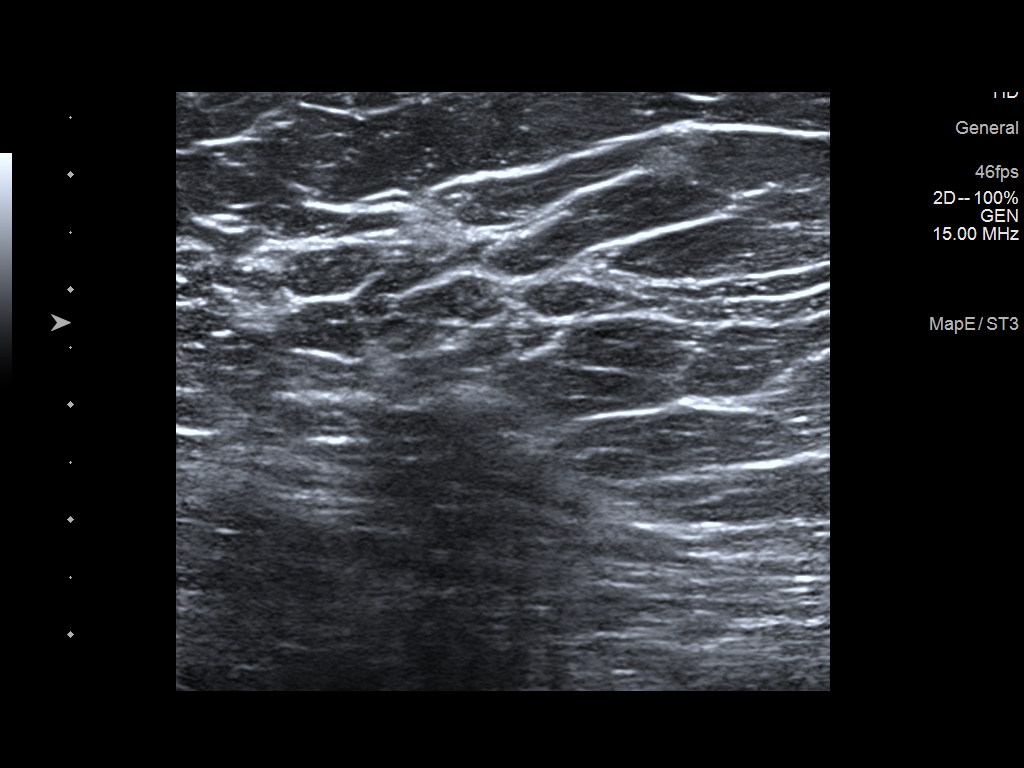
[im 2/5]
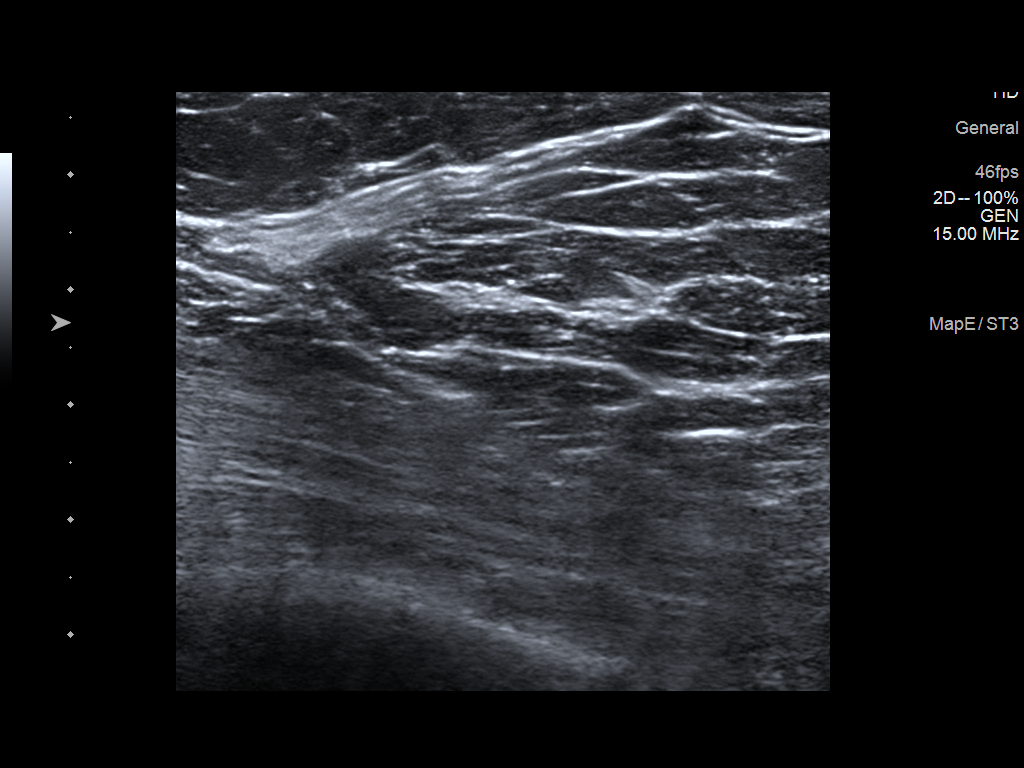
[im 3/5]
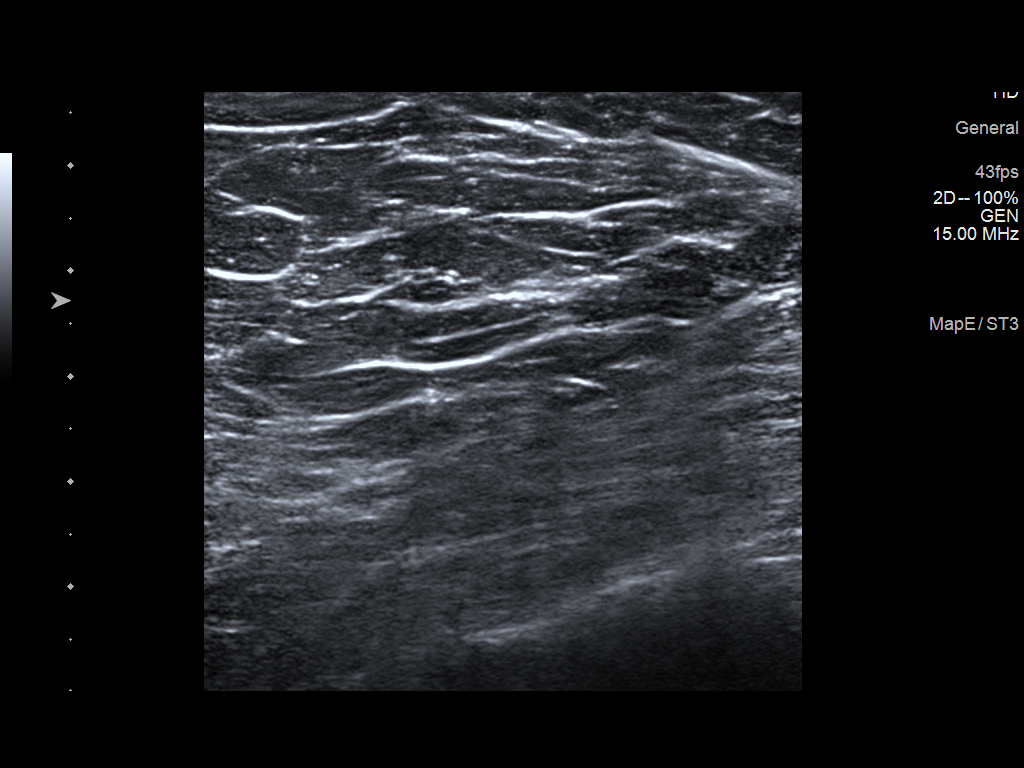
[im 4/5]
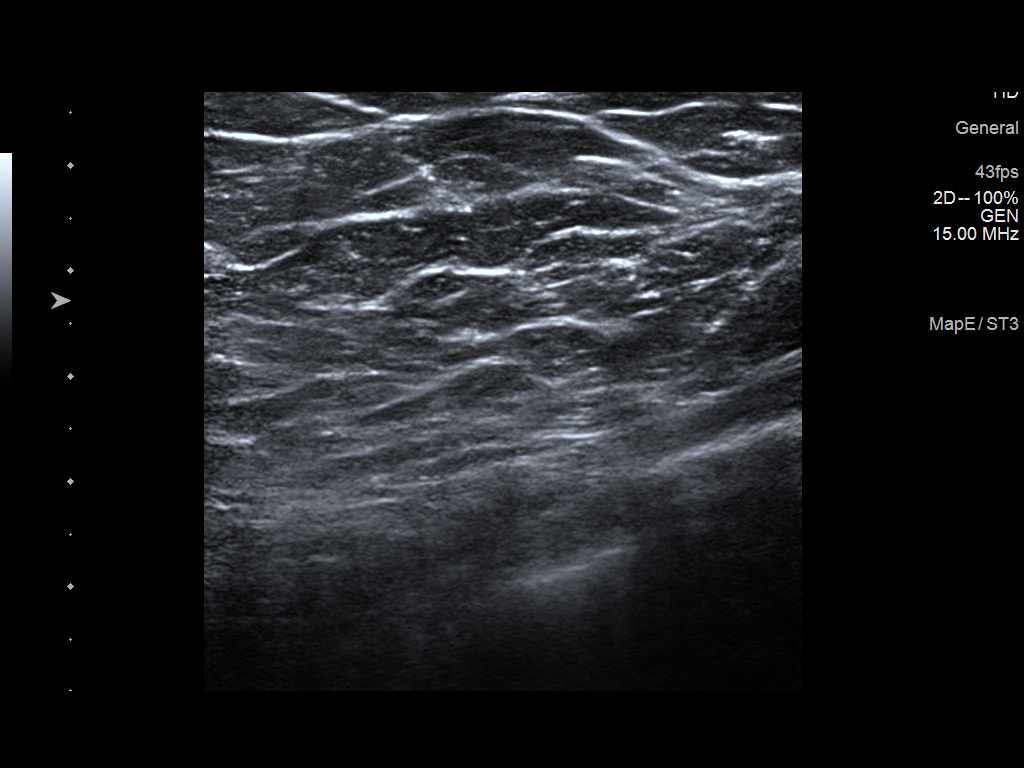
[im 5/5]
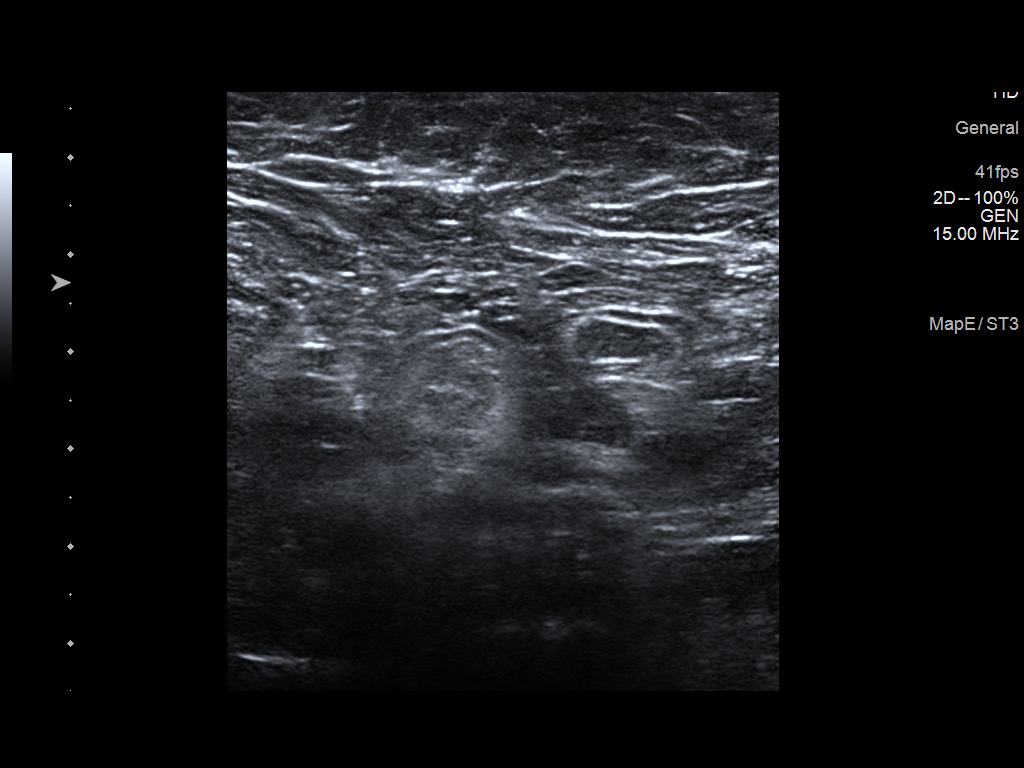

[5 of 5 positions shown; findings below may reference images not displayed]

ACR Breast Density Category b: There are scattered areas of
fibroglandular density.
FINDINGS: Spot compression cc and MLO views of the left breast are submitted.
Previously questioned asymmetry in the posterior upper-outer
quadrant left breast persists on additional views.

Mammographic images were processed with CAD.

Targeted ultrasound is performed, showing no focal abnormal discrete
cystic or solid lesion in the upper-outer quadrant left breast of
correlate to the mammographic finding.
IMPRESSION: Suspicious findings.

RECOMMENDATION:
Stereotactic core biopsy of left breast asymmetry.

I have discussed the findings and recommendations with the patient.
If applicable, a reminder letter will be sent to the patient
regarding the next appointment.

BI-RADS CATEGORY  4: Suspicious.

## 2021-06-22 ENCOUNTER — Other Ambulatory Visit: Payer: Self-pay | Admitting: Nurse Practitioner

## 2021-06-22 MED ORDER — PREGABALIN 150 MG PO CAPS: ORAL_CAPSULE | ORAL | 0 refills | Status: AC

## 2021-07-11 ENCOUNTER — Ambulatory Visit: Payer: 59 | Admitting: Nurse Practitioner

## 2022-01-18 ENCOUNTER — Encounter: Payer: 59 | Admitting: Internal Medicine

## 2023-01-22 ENCOUNTER — Encounter: Payer: 59 | Admitting: Internal Medicine
